# Patient Record
Sex: Male | Born: 1955 | Race: White | Hispanic: No | Marital: Married | Smoking: Current every day smoker
Health system: Southern US, Community
[De-identification: ages and names within clinical notes are randomized; demographics above are authoritative.]

## PROBLEM LIST (undated history)

## (undated) DIAGNOSIS — I4891 Unspecified atrial fibrillation: Secondary | ICD-10-CM

## (undated) DIAGNOSIS — E119 Type 2 diabetes mellitus without complications: Secondary | ICD-10-CM

## (undated) DIAGNOSIS — F32A Depression, unspecified: Secondary | ICD-10-CM

## (undated) DIAGNOSIS — I509 Heart failure, unspecified: Secondary | ICD-10-CM

## (undated) DIAGNOSIS — J45909 Unspecified asthma, uncomplicated: Secondary | ICD-10-CM

## (undated) DIAGNOSIS — Q231 Congenital insufficiency of aortic valve: Secondary | ICD-10-CM

## (undated) DIAGNOSIS — I10 Essential (primary) hypertension: Secondary | ICD-10-CM

## (undated) DIAGNOSIS — J449 Chronic obstructive pulmonary disease, unspecified: Secondary | ICD-10-CM

## (undated) DIAGNOSIS — Q2381 Bicuspid aortic valve: Secondary | ICD-10-CM

## (undated) HISTORY — DX: Essential (primary) hypertension: I10

## (undated) HISTORY — DX: Chronic obstructive pulmonary disease, unspecified: J44.9

## (undated) HISTORY — DX: Heart failure, unspecified: I50.9

## (undated) HISTORY — DX: Unspecified asthma, uncomplicated: J45.909

## (undated) HISTORY — PX: OTHER SURGICAL HISTORY: SHX169

## (undated) HISTORY — DX: Depression, unspecified: F32.A

---

## 2004-08-18 ENCOUNTER — Emergency Department: Payer: Self-pay | Admitting: Emergency Medicine

## 2004-08-18 ENCOUNTER — Other Ambulatory Visit: Payer: Self-pay

## 2004-09-03 ENCOUNTER — Other Ambulatory Visit: Payer: Self-pay

## 2004-09-04 ENCOUNTER — Other Ambulatory Visit: Payer: Self-pay

## 2004-09-04 ENCOUNTER — Inpatient Hospital Stay: Payer: Self-pay | Admitting: Anesthesiology

## 2006-07-28 ENCOUNTER — Ambulatory Visit: Payer: Self-pay | Admitting: Unknown Physician Specialty

## 2007-05-31 ENCOUNTER — Emergency Department: Payer: Self-pay | Admitting: Emergency Medicine

## 2007-05-31 ENCOUNTER — Other Ambulatory Visit: Payer: Self-pay

## 2008-11-16 ENCOUNTER — Emergency Department: Payer: Self-pay | Admitting: Emergency Medicine

## 2009-11-24 IMAGING — CR DG CHEST 2V
1 series · 2 of 2 positions shown · non-contrast
Comparison: none

REASON FOR EXAM: chest pain
COMMENTS:

PROCEDURE:     DXR - DXR CHEST PA (OR AP) AND LATERAL  - May 31, 2007  [DATE]
RESULT:     The lung fields are clear. The heart, mediastinal and osseous
structures show no significant abnormalities.

[Series 1: view not recorded · 0.17mm/px · 2 of 2 slices shown]
[im 1/2]
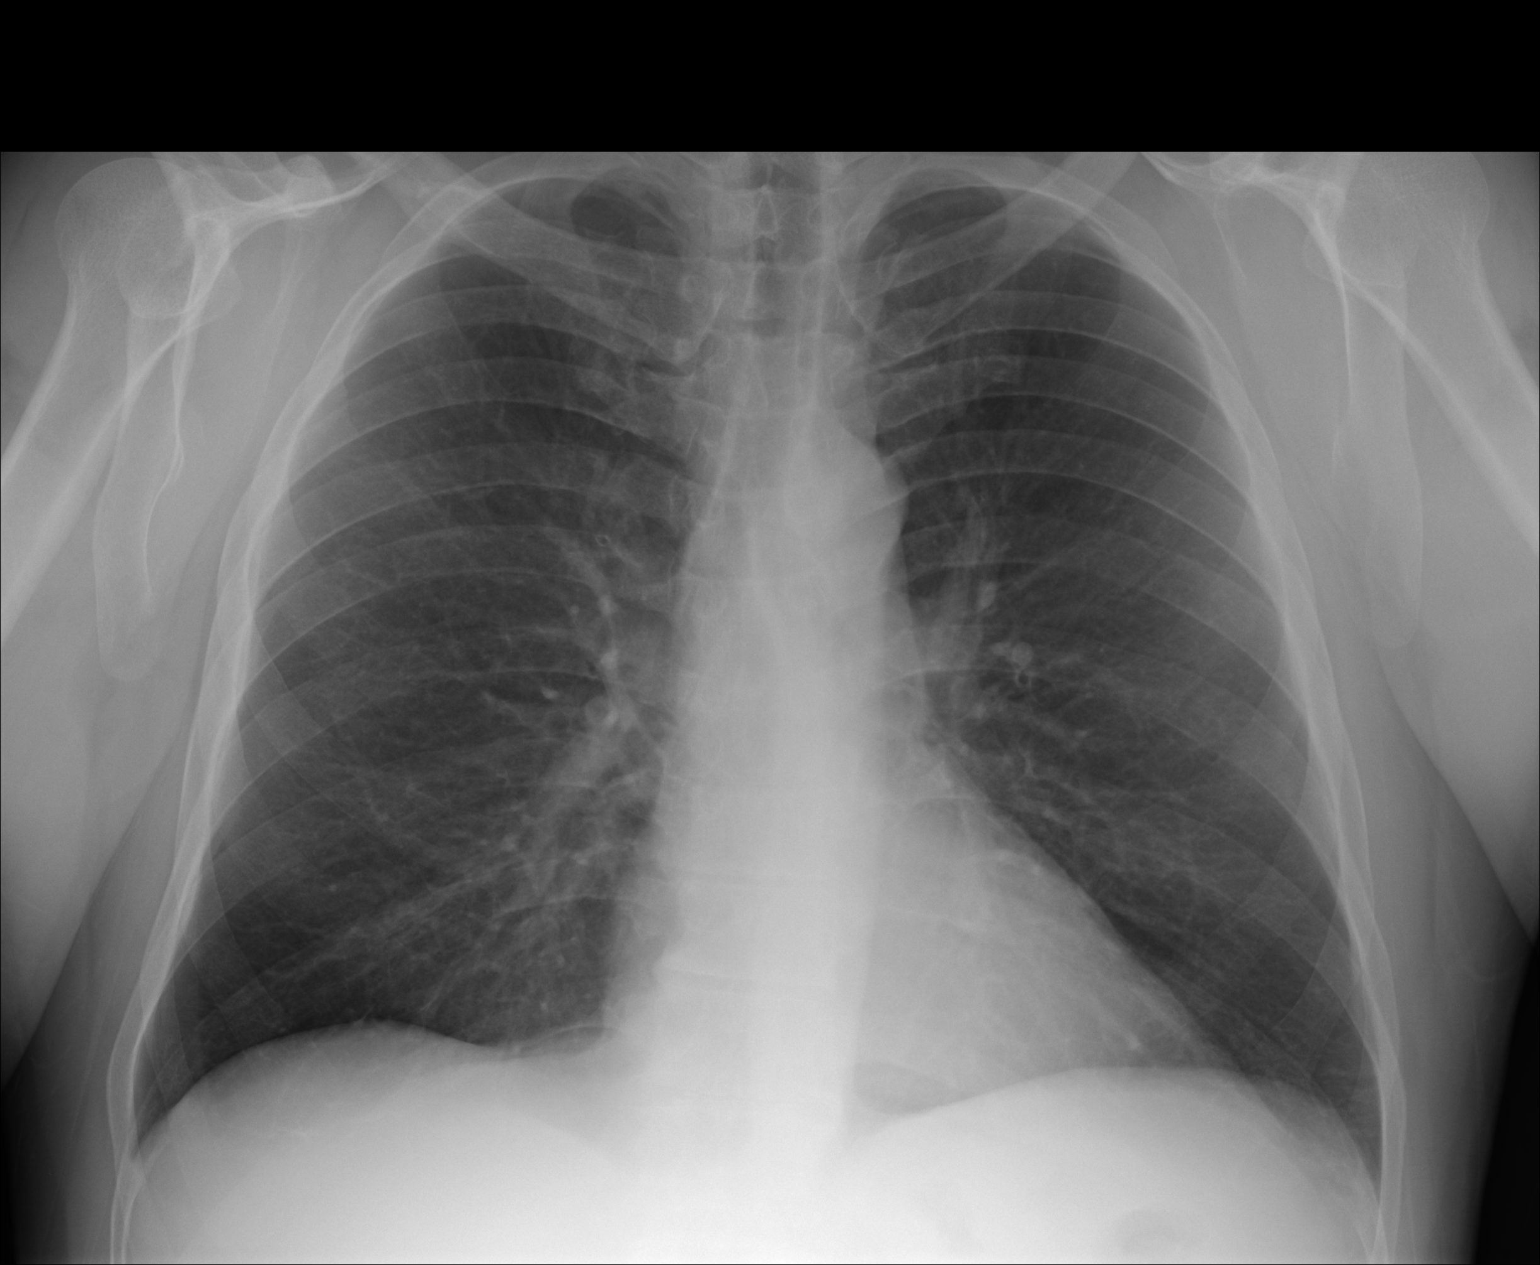
[im 2/2]
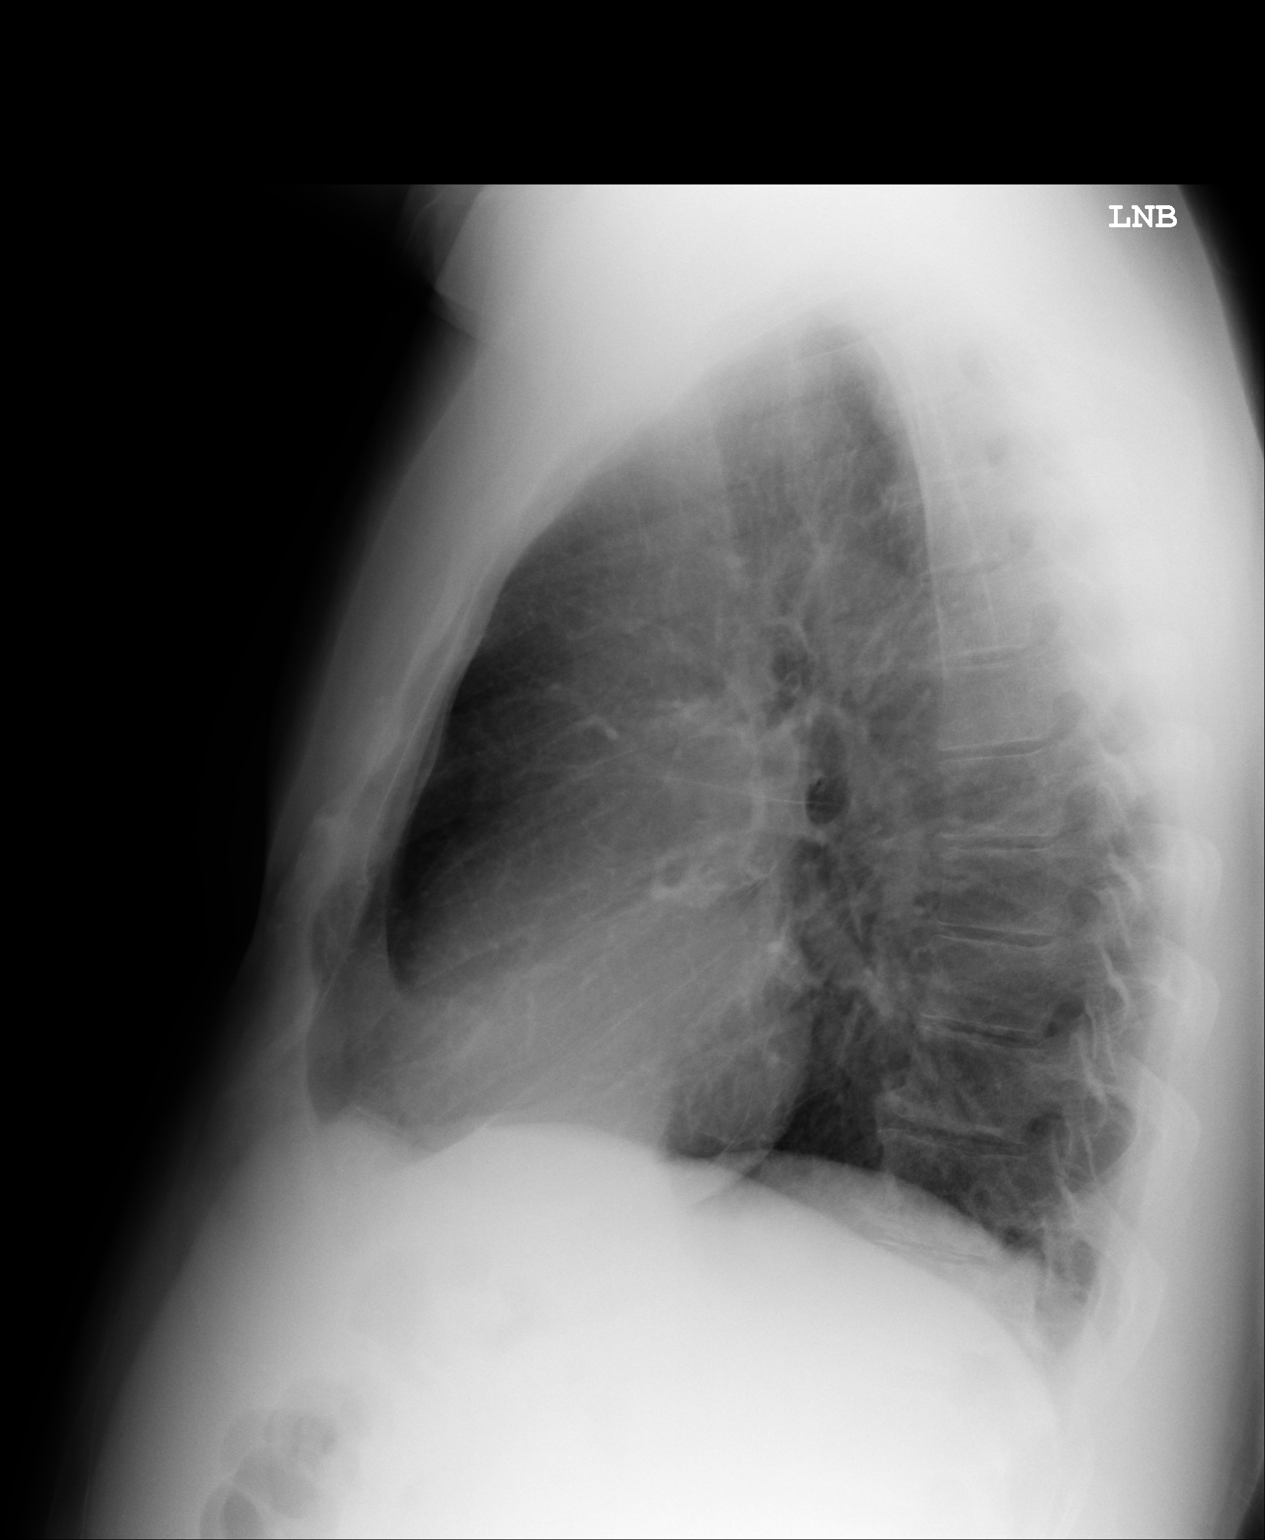

[2 of 2 positions shown; findings below may reference images not displayed]

IMPRESSION: No significant abnormalities are noted.

## 2010-10-03 ENCOUNTER — Emergency Department: Payer: Self-pay | Admitting: Internal Medicine

## 2011-05-13 IMAGING — CR DG CHEST 1V PORT
1 series · 1 of 1 positions shown · non-contrast
Comparison: none

REASON FOR EXAM: Chest pain
COMMENTS:

PROCEDURE:          DXR PORTABLE CHEST SINGLE VIEW 11/16/2008 [DATE]
RESULT:     Comparison is made to prior study dated 05-31-07. Note; this is a
re-dictation of a previously dictated report.
The lungs are clear. The cardiac silhouette and visualized bony skeleton are
unremarkable.

[view not recorded]
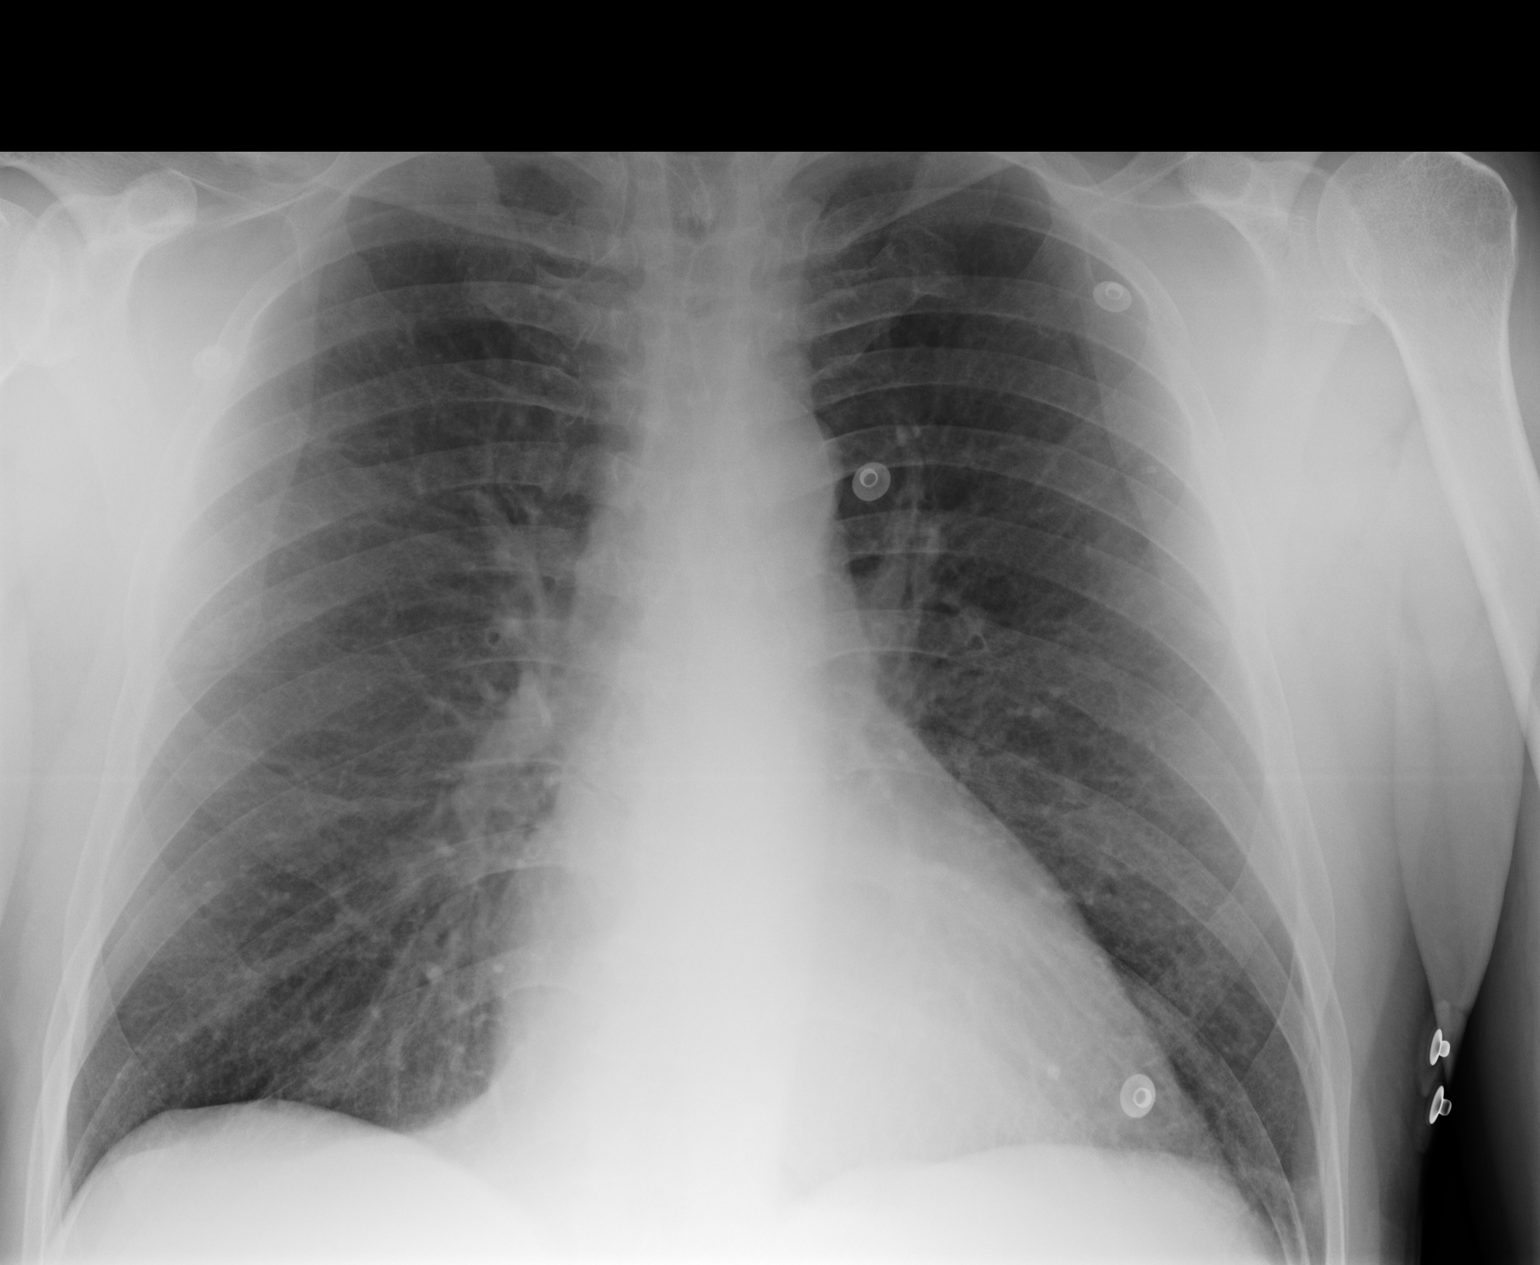

[1 of 1 positions shown; findings below may reference images not displayed]

IMPRESSION: 1. Chest radiograph without evidence of acute cardiopulmonary disease.

## 2017-08-08 DIAGNOSIS — N189 Chronic kidney disease, unspecified: Secondary | ICD-10-CM | POA: Diagnosis not present

## 2017-08-08 DIAGNOSIS — I70269 Atherosclerosis of native arteries of extremities with gangrene, unspecified extremity: Secondary | ICD-10-CM | POA: Insufficient documentation

## 2017-08-08 DIAGNOSIS — M79671 Pain in right foot: Secondary | ICD-10-CM | POA: Diagnosis present

## 2017-08-08 LAB — CBC WITH DIFFERENTIAL/PLATELET
BASOS ABS: 0.1 10*3/uL (ref 0–0.1)
Basophils Relative: 1 %
Eosinophils Absolute: 0.2 10*3/uL (ref 0–0.7)
Eosinophils Relative: 2 %
HEMATOCRIT: 41.1 % (ref 40.0–52.0)
Hemoglobin: 14.7 g/dL (ref 13.0–18.0)
LYMPHS PCT: 17 %
Lymphs Abs: 1.6 10*3/uL (ref 1.0–3.6)
MCH: 32.9 pg (ref 26.0–34.0)
MCHC: 35.7 g/dL (ref 32.0–36.0)
MCV: 92.2 fL (ref 80.0–100.0)
Monocytes Absolute: 0.7 10*3/uL (ref 0.2–1.0)
Monocytes Relative: 7 %
NEUTROS ABS: 7.2 10*3/uL — AB (ref 1.4–6.5)
NEUTROS PCT: 73 %
Platelets: 428 10*3/uL (ref 150–440)
RBC: 4.45 MIL/uL (ref 4.40–5.90)
RDW: 14.3 % (ref 11.5–14.5)
WBC: 9.8 10*3/uL (ref 3.8–10.6)

## 2017-08-08 LAB — LACTIC ACID, PLASMA: Lactic Acid, Venous: 0.9 mmol/L (ref 0.5–1.9)

## 2017-08-08 LAB — BASIC METABOLIC PANEL
ANION GAP: 11 (ref 5–15)
BUN: 21 mg/dL — ABNORMAL HIGH (ref 6–20)
CO2: 27 mmol/L (ref 22–32)
Calcium: 9.8 mg/dL (ref 8.9–10.3)
Chloride: 90 mmol/L — ABNORMAL LOW (ref 101–111)
Creatinine, Ser: 1.72 mg/dL — ABNORMAL HIGH (ref 0.61–1.24)
GFR calc Af Amer: 47 mL/min — ABNORMAL LOW (ref >60)
GFR, EST NON AFRICAN AMERICAN: 41 mL/min — AB (ref >60)
GLUCOSE: 140 mg/dL — AB (ref 65–99)
POTASSIUM: 4.5 mmol/L (ref 3.5–5.1)
Sodium: 128 mmol/L — ABNORMAL LOW (ref 135–145)

## 2017-08-08 NOTE — ED Triage Notes (Signed)
Patient reports has poor blood flow to right foot.  Family reports that he just got [facility] right [facility].  Reports increased pain with and redness.

## 2017-08-09 ENCOUNTER — Emergency Department
Admission: EM | Admit: 2017-08-09 | Discharge: 2017-08-09 | Disposition: A | Discharge status: Home / Self Care | Payer: Non-veteran care | Attending: Emergency Medicine | Admitting: Emergency Medicine

## 2017-08-09 DIAGNOSIS — I739 Peripheral vascular disease, unspecified: Secondary | ICD-10-CM

## 2017-08-09 DIAGNOSIS — I96 Gangrene, not elsewhere classified: Secondary | ICD-10-CM

## 2017-08-09 DIAGNOSIS — N189 Chronic kidney disease, unspecified: Secondary | ICD-10-CM

## 2017-08-09 MED ORDER — CLINDAMYCIN HCL 150 MG PO CAPS: 450.0000 mg | ORAL_CAPSULE | Freq: Three times a day (TID) | ORAL | 0 refills | Status: AC

## 2017-08-09 MED ORDER — OXYCODONE HCL 5 MG PO TABS: 5.0000 mg | ORAL_TABLET | Freq: Three times a day (TID) | ORAL | 0 refills | Status: AC | PRN

## 2017-08-09 MED ORDER — OXYCODONE HCL 5 MG PO TABS: 5.0000 mg | ORAL_TABLET | Freq: Three times a day (TID) | ORAL | 0 refills | Status: DC | PRN

## 2017-08-09 MED ORDER — OXYCODONE-ACETAMINOPHEN 5-325 MG PO TABS
1.0000 | ORAL_TABLET | Freq: Once | ORAL | Status: AC
  Administered 2017-08-09: 1 via ORAL

## 2017-08-09 MED ORDER — CLINDAMYCIN HCL 150 MG PO CAPS: 150.0000 mg | ORAL_CAPSULE | Freq: Three times a day (TID) | ORAL | 0 refills | Status: DC

## 2017-08-09 MED ORDER — OXYCODONE-ACETAMINOPHEN 5-325 MG PO TABS
ORAL_TABLET | ORAL | Status: AC
  Administered 2017-08-09: 1 via ORAL
  Filled 2017-08-09: qty 1

## 2017-08-09 NOTE — ED Notes (Signed)

## 2017-08-09 NOTE — ED Provider Notes (Signed)
[facility] Emergency Department Provider Note  ____________________________________________   First MD Initiated Contact with Patient 08/09/17 0113     (approximate)  I have reviewed the triage vital signs and the nursing notes.   HISTORY  Chief Complaint Foot Pain   HPI Jason Beck is a 62 y.o. male comes to the emergency department with right foot pain.  He has a history of chronic right foot pain secondary to poor circulation.  He is followed at the [facility] and is scheduled to have his right great toe and right fourth toe amputated next week.  He has been intermittently taking clindamycin for the past month or so although has recently run out.  He denies fevers or chills.  Today his sister became concerned about his symptoms and wanted a second opinion so she called 911.  The patient has moderate severity throbbing aching discomfort in his right foot improved when putting it down worsened when elevating.  No past medical history on file.  There are no active problems to display for this patient.     Prior to Admission medications   Medication Sig Start Date End Date Taking? Authorizing Provider  clindamycin (CLEOCIN) 150 MG capsule Take 3 capsules (450 mg total) by mouth 3 (three) times daily for 14 days. 08/09/17 08/23/17  Merrily Brittleifenbark, Darvell Monteforte, MD  oxyCODONE (ROXICODONE) 5 MG immediate release tablet Take 1 tablet (5 mg total) by mouth every 8 (eight) hours as needed for up to 11 days. 08/09/17 08/20/17  Merrily Brittleifenbark, Kelcey Korus, MD    Allergies Patient has no known allergies.  No family history on file.  Social History Social History   Tobacco Use  . Smoking status: Not on file  Substance Use Topics  . Alcohol use: Not on file  . Drug use: Not on file    Review of Systems Constitutional: No fever/chills Eyes: No visual changes. ENT: No sore throat. Cardiovascular: Denies chest pain. Respiratory: Denies shortness of  breath. Gastrointestinal: No abdominal pain.  No nausea, no vomiting.  No diarrhea.  No constipation. Genitourinary: Negative for dysuria. Musculoskeletal: Negative for back pain. Skin: Positive for wound Neurological: Negative for headaches, focal weakness or numbness.   ____________________________________________   PHYSICAL EXAM:  VITAL SIGNS: ED Triage Vitals  Enc Vitals Group     BP 08/08/17 2232 (!) 102/56     Pulse Rate 08/08/17 2232 87     Resp 08/08/17 2232 20     Temp 08/08/17 2232 98.6 F (37 C)     Temp Source 08/08/17 2232 Oral     SpO2 08/08/17 2232 100 %     Weight 08/08/17 2231 150 lb (68 kg)     Height 08/08/17 2231 5\' 10"  (1.778 m)     Head Circumference --      Peak Flow --      Pain Score 08/08/17 2231 10     Pain Loc --      Pain Edu? --      Excl. in GC? --     Constitutional: Alert and oriented x4 pleasant cooperative speaks full clear sentences no diaphoresis Eyes: PERRL EOMI. Head: Atraumatic. Nose: No congestion/rhinnorhea. Mouth/Throat: No trismus Neck: No stridor.   Cardiovascular: Normal rate, regular rhythm. Grossly normal heart sounds.  Good peripheral circulation. Respiratory: Normal respiratory effort.  No retractions. Lungs CTAB and moving good air Gastrointestinal: Soft nontender Musculoskeletal: Right foot with dragging green of the great toe and toe.  Biphasic PT and DP pulses Neurologic:  Normal speech and language. No gross focal neurologic deficits are appreciated. Skin:  Skin is warm, dry and intact. No rash noted. Psychiatric: Mood and affect are normal. Speech and behavior are normal.    ____________________________________________   DIFFERENTIAL includes but not limited to  Wet gangrene, dry gangrene, osteomyelitis, sepsis ____________________________________________   LABS (all labs ordered are listed, but only abnormal results are displayed)  Labs Reviewed  CBC WITH DIFFERENTIAL/PLATELET - Abnormal; Notable for  the following components:      Result Value   Neutro Abs 7.2 (*)    All other components within normal limits  BASIC METABOLIC PANEL - Abnormal; Notable for the following components:   Sodium 128 (*)    Chloride 90 (*)    Glucose, Bld 140 (*)    BUN 21 (*)    Creatinine, Ser 1.72 (*)    GFR calc non Af Amer 41 (*)    GFR calc Af Amer 47 (*)    All other components within normal limits  CULTURE, BLOOD (ROUTINE X 2)  LACTIC ACID, PLASMA    Lab work reviewed by me with hypochloremic hyponatremia consistent with dehydration __________________________________________  EKG   ____________________________________________  RADIOLOGY   ____________________________________________   PROCEDURES  Procedure(s) performed: no  Procedures  Critical Care performed: no  Observation: no ____________________________________________   INITIAL IMPRESSION / ASSESSMENT AND PLAN / ED COURSE  Pertinent labs & imaging results that were available during my care of the patient were reviewed by me and considered in my medical decision making (see chart for details).  The patient is hemodynamically stable and clearly not septic.  He has biphasic PT and DP pulses.  His gangrene has been gradual onset over the past month and he has no acute surgical interventions warranted.  He has follow-up at the [place] this week.  I will refill his clindamycin as well as his pain medication.  He is discharged home in improved condition with strict return precautions.  He and his sister verbalized understanding and agreement the plan.     -----------------------------------------  FINAL CLINICAL IMPRESSION(S) / ED DIAGNOSES  Final diagnoses:  Dry gangrene (HCC)  Peripheral artery disease (HCC)  Chronic kidney disease, unspecified CKD stage      NEW MEDICATIONS STARTED DURING THIS VISIT:  Discharge Medication List as of 08/09/2017  1:24 AM    START taking these medications   Details  clindamycin  (CLEOCIN) 150 MG capsule Take 1 capsule (150 mg total) by mouth 3 (three) times daily for 14 days., Starting Mon 08/09/2017, Until Mon 08/23/2017, Print    oxyCODONE (ROXICODONE) 5 MG immediate release tablet Take 1 tablet (5 mg total) by mouth every 8 (eight) hours as needed for up to 11 days., Starting Mon 08/09/2017, Until Fri 08/20/2017, Print         Note:  This document was prepared using Dragon voice recognition software and may include unintentional dictation errors.     Merrily Brittle, MD 08/12/17 [phone]

## 2017-08-09 NOTE — Discharge Instructions (Signed)
Please resume taking antibiotics as prescribed and [facility].  Keep your appointment for surgery as scheduled.  Return to the emergency department sooner for any concerns.  It was a pleasure to take care of you today, and thank you for coming to our emergency department.  If you have any questions or concerns before leaving please ask the nurse to grab me and I'm more than happy to go through your aftercare instructions again.  If you were prescribed any opioid pain medication today such as Norco, Vicodin, Percocet, morphine, hydrocodone, or oxycodone please make sure you do not drive when you are taking this medication as it can alter your ability to drive safely.  If you have any concerns once you are home that you are not improving or are in fact getting worse before you can make it to your follow-up appointment, please do not hesitate to call 911 and come back for further evaluation.  Merrily BrittleNeil Radonna Bracher, MD  Results for orders placed or performed during the hospital encounter of 08/09/17  CBC with Differential  Result Value Ref Range   WBC 9.8 3.8 - 10.6 K/uL   RBC 4.45 4.40 - 5.90 MIL/uL   Hemoglobin 14.7 13.0 - 18.0 g/dL   HCT 16.141.1 09.640.0 - 04.552.0 %   MCV 92.2 80.0 - 100.0 fL   MCH 32.9 26.0 - 34.0 pg   MCHC 35.7 32.0 - 36.0 g/dL   RDW 40.914.3 81.111.5 - 91.414.5 %   Platelets 428 150 - 440 K/uL   Neutrophils Relative % 73 %   Neutro Abs 7.2 (H) 1.4 - 6.5 K/uL   Lymphocytes Relative 17 %   Lymphs Abs 1.6 1.0 - 3.6 K/uL   Monocytes Relative 7 %   Monocytes Absolute 0.7 0.2 - 1.0 K/uL   Eosinophils Relative 2 %   Eosinophils Absolute 0.2 0 - 0.7 K/uL   Basophils Relative 1 %   Basophils Absolute 0.1 0 - 0.1 K/uL  Basic metabolic panel  Result Value Ref Range   Sodium 128 (L) 135 - 145 mmol/L   Potassium 4.5 3.5 - 5.1 mmol/L   Chloride 90 (L) 101 - 111 mmol/L   CO2 27 22 - 32 mmol/L   Glucose, Bld 140 (H) 65 - 99 mg/dL   BUN 21 (H) 6 - 20 mg/dL   Creatinine, Ser 7.821.72 (H) 0.61 - 1.24 mg/dL   Calcium 9.8 8.9 - 95.610.3 mg/dL   GFR calc non Af Amer 41 (L) >60 mL/min   GFR calc Af Amer 47 (L) >60 mL/min   Anion gap 11 5 - 15  Lactic acid, plasma  Result Value Ref Range   Lactic Acid, Venous 0.9 0.5 - 1.9 mmol/L

## 2017-08-13 LAB — CULTURE, BLOOD (ROUTINE X 2)
CULTURE: NO GROWTH
SPECIAL REQUESTS: ADEQUATE

## 2017-12-10 ENCOUNTER — Encounter: Payer: Self-pay | Admitting: Podiatry

## 2017-12-10 ENCOUNTER — Other Ambulatory Visit: Payer: Self-pay | Admitting: Podiatry

## 2017-12-10 ENCOUNTER — Ambulatory Visit (INDEPENDENT_AMBULATORY_CARE_PROVIDER_SITE_OTHER): Payer: No Typology Code available for payment source

## 2017-12-10 ENCOUNTER — Ambulatory Visit (INDEPENDENT_AMBULATORY_CARE_PROVIDER_SITE_OTHER): Payer: No Typology Code available for payment source | Admitting: Podiatry

## 2017-12-10 DIAGNOSIS — I739 Peripheral vascular disease, unspecified: Secondary | ICD-10-CM

## 2017-12-10 DIAGNOSIS — M79671 Pain in right foot: Secondary | ICD-10-CM

## 2017-12-10 DIAGNOSIS — S98131A Complete traumatic amputation of one right lesser toe, initial encounter: Secondary | ICD-10-CM

## 2017-12-12 NOTE — Progress Notes (Signed)
   Subjective:  Patient presents today status post 2nd and 5th toe amputation of the right foot done at the [facility] three months ago. He states he had a wound vac in place but is no longer using it. He reports the wounds are now healing. He also reports elongated, thickened toenails of the 1st, 3rd and 4th digits of the right foot that cause pain while ambulating. He is unable to trim his own nails. Patient is here for further evaluation and treatment.   No past medical history on file.    Objective/Physical Exam Neurovascular status intact.  Skin incisions appear to be healed. No sign of infectious process noted. No dehiscence. No active bleeding noted. No edema noted to the surgical extremity. Nails are tender, long, thickened and dystrophic with subungual debris, consistent with onychomycosis of the 1st, 3rd and 4th toes of the right foot.   Radiographic Exam:  Osteotomies sites appear to be stable with routine healing.  Assessment: 1. 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 Diabetes Mellitus w/ peripheral neuropathy 3. Onychomycosis of nail due to dermatophyte 1, 3 and 4 digits right     Plan of Care:  1. Patient was evaluated. X-rays reviewed 2. Mechanical debridement of nails 1, 3 and 4 of the right foot performed using a nail nipper. Filed with dremel without incident.  3. Discontinue using post op shoe. Resume wearing good shoe gear.  4. Return to clinic as needed.    Felecia ShellingBrent M. Macklin Jacquin, DPM [facility]  Dr. Felecia ShellingBrent M. Madilyne Tadlock, DPM    [address]                                        [place], [place] [address]                Office [phone]  Fax [phone]

## 2018-01-07 ENCOUNTER — Ambulatory Visit (INDEPENDENT_AMBULATORY_CARE_PROVIDER_SITE_OTHER): Payer: No Typology Code available for payment source | Admitting: Podiatry

## 2018-01-07 ENCOUNTER — Ambulatory Visit (INDEPENDENT_AMBULATORY_CARE_PROVIDER_SITE_OTHER): Payer: Non-veteran care

## 2018-01-07 ENCOUNTER — Encounter: Payer: Self-pay | Admitting: Podiatry

## 2018-01-07 DIAGNOSIS — S90851A Superficial foreign body, right foot, initial encounter: Secondary | ICD-10-CM

## 2018-01-12 NOTE — Progress Notes (Signed)
   HPI: 62 year old male presents the office today for evaluation of a possible foreign body to the plantar aspect of the right foot.  Patient states that approximately 2 weeks ago he thinks he stepped on a piece of glass.  Walking aggravates his symptoms.  Is very tender to palpation.  He continues to have chronic soreness to the area.  He was barefoot at the time of injury.  He presents for further treatment evaluation  No past medical history on file.   Physical Exam: General: The patient is alert and oriented x3 in no acute distress.  Dermatology: Skin is warm, dry and supple bilateral lower extremities.  There is a focal area of callus tissue with a small draining sinus noted to the plantar aspect of the right foot.  Vascular: Palpable pedal pulses bilaterally. No edema or erythema noted. Capillary refill within normal limits.  Neurological: Epicritic and protective threshold diminished bilaterally.   Musculoskeletal Exam: Range of motion within normal limits to all pedal and ankle joints bilateral. Muscle strength 5/5 in all groups bilateral.   Radiographic Exam:  Normal osseous mineralization. Joint spaces preserved. No fracture/dislocation/boney destruction.  No visual evidence of foreign body  Assessment: 1.  Foreign body right plantar foot - glass shard approximately 5 mm   Plan of Care:  1. Patient evaluated. X-Rays reviewed.  2.  Light debridement of the area was performed using a tissue nipper and a glass shard was removed from the plantar aspect of the foot approximately 5 mm in length.  Wound was evaluated to ensure that all foreign bodies were removed.  Light debridement performed using a tissue nipper.  Incision and drainage of the focal abscess was performed.  Antibiotic ointment a Band-Aid was applied. 3.  Anesthesia not utilized due to the patient's known peripheral neuropathy 4.  Recommend antibiotic ointment and Band-Aid daily x5 days. 5.  Return to clinic as  needed      Felecia Shelling, DPM [facility]  Dr. Felecia Shelling, DPM    [address] [place], [place] [address]                Office [phone]  Fax [phone]

## 2018-03-24 ENCOUNTER — Telehealth: Payer: Self-pay | Admitting: Podiatry

## 2018-03-24 NOTE — Telephone Encounter (Signed)
Please send notes for 12/10/17 visti,  to [facility] at [phone]

## 2018-06-01 ENCOUNTER — Encounter: Payer: Self-pay | Admitting: Emergency Medicine

## 2018-06-01 ENCOUNTER — Emergency Department
Admission: EM | Admit: 2018-06-01 | Discharge: 2018-06-01 | Disposition: A | Discharge status: Home / Self Care | Payer: Non-veteran care | Attending: Emergency Medicine | Admitting: Emergency Medicine

## 2018-06-01 ENCOUNTER — Emergency Department: Payer: Non-veteran care

## 2018-06-01 ENCOUNTER — Other Ambulatory Visit: Payer: Self-pay

## 2018-06-01 DIAGNOSIS — R0602 Shortness of breath: Secondary | ICD-10-CM | POA: Diagnosis present

## 2018-06-01 DIAGNOSIS — F17210 Nicotine dependence, cigarettes, uncomplicated: Secondary | ICD-10-CM | POA: Insufficient documentation

## 2018-06-01 DIAGNOSIS — J441 Chronic obstructive pulmonary disease with (acute) exacerbation: Secondary | ICD-10-CM | POA: Insufficient documentation

## 2018-06-01 DIAGNOSIS — R0600 Dyspnea, unspecified: Secondary | ICD-10-CM

## 2018-06-01 DIAGNOSIS — Z79899 Other long term (current) drug therapy: Secondary | ICD-10-CM | POA: Insufficient documentation

## 2018-06-01 LAB — BASIC METABOLIC PANEL
ANION GAP: 9 (ref 5–15)
BUN: 12 mg/dL (ref 8–23)
CO2: 25 mmol/L (ref 22–32)
Calcium: 9.1 mg/dL (ref 8.9–10.3)
Chloride: 102 mmol/L (ref 98–111)
Creatinine, Ser: 0.94 mg/dL (ref 0.61–1.24)
Glucose, Bld: 163 mg/dL — ABNORMAL HIGH (ref 70–99)
POTASSIUM: 3.9 mmol/L (ref 3.5–5.1)
SODIUM: 136 mmol/L (ref 135–145)

## 2018-06-01 LAB — CBC WITH DIFFERENTIAL/PLATELET
ABS IMMATURE GRANULOCYTES: 0.04 10*3/uL (ref 0.00–0.07)
BASOS PCT: 1 %
Basophils Absolute: 0.1 10*3/uL (ref 0.0–0.1)
Eosinophils Absolute: 0.7 10*3/uL — ABNORMAL HIGH (ref 0.0–0.5)
Eosinophils Relative: 7 %
HCT: 48.4 % (ref 39.0–52.0)
Hemoglobin: 16.1 g/dL (ref 13.0–17.0)
IMMATURE GRANULOCYTES: 0 %
LYMPHS PCT: 43 %
Lymphs Abs: 4 10*3/uL (ref 0.7–4.0)
MCH: 31.1 pg (ref 26.0–34.0)
MCHC: 33.3 g/dL (ref 30.0–36.0)
MCV: 93.6 fL (ref 80.0–100.0)
Monocytes Absolute: 0.7 10*3/uL (ref 0.1–1.0)
Monocytes Relative: 7 %
NEUTROS ABS: 3.9 10*3/uL (ref 1.7–7.7)
NEUTROS PCT: 42 %
PLATELETS: 270 10*3/uL (ref 150–400)
RBC: 5.17 MIL/uL (ref 4.22–5.81)
RDW: 13.2 % (ref 11.5–15.5)
WBC: 9.3 10*3/uL (ref 4.0–10.5)
nRBC: 0 % (ref 0.0–0.2)

## 2018-06-01 LAB — BRAIN NATRIURETIC PEPTIDE: B Natriuretic Peptide: 152 pg/mL — ABNORMAL HIGH (ref 0.0–100.0)

## 2018-06-01 LAB — TROPONIN I: Troponin I: <0.03 ng/mL (ref <0.03)

## 2018-06-01 MED ORDER — IPRATROPIUM-ALBUTEROL 0.5-2.5 (3) MG/3ML IN SOLN
3.0000 mL | Freq: Once | RESPIRATORY_TRACT | Status: AC
  Administered 2018-06-01: 3 mL via RESPIRATORY_TRACT
  Filled 2018-06-01: qty 3

## 2018-06-01 MED ORDER — PREDNISONE 10 MG PO TABS: 10.0000 mg | ORAL_TABLET | Freq: Every day | ORAL | 0 refills | Status: DC

## 2018-06-01 NOTE — ED Triage Notes (Signed)
Pt arrived to the ED via EMS from home for complaints of shortness of breath for the past 2 days. Pt reports that he has a history of COPD and he is experiencing an exacerbation that was unrelieved by home breathing treatments and taking using an Epipen. Pt is AOx4 in no apparent distress upon arrival to the hospital. EMS gave 125mg  of Solumedrol and 1 duo neb in rout.

## 2018-06-01 NOTE — ED Provider Notes (Signed)
[facility] Emergency Department Provider Note  Time seen: 7:13 AM  I have reviewed the triage vital signs and the nursing notes.   HISTORY  Chief Complaint Shortness of Breath    HPI Jason Beck is a 63 y.o. male with a past medical history of COPD presents to the emergency department for shortness of breath.  According to the patient he woke up starting around 3:00 overnight feeling short of breath took his albuterol inhaler with some relief, then again awoke around 5 AM with shortness of breath uses albuterol inhaler once again, with minimal relief this time so he came into the emergency department for evaluation.  Patient received 125 mg of Solu-Medrol 1 DuoNeb by EMS prior to arrival.  Upon arrival patient states he feels much better.  Denies any significant trouble breathing at this time.  Patient states earlier this morning he was feeling mild chest tightness although denies any currently.  Denies any leg pain or swelling.  Has a history of COPD but no baseline O2 requirement.   History reviewed. No pertinent past medical history.  There are no active problems to display for this patient.   History reviewed. No pertinent surgical history.  Prior to Admission medications   Medication Sig Start Date End Date Taking? Authorizing Provider  clopidogrel (PLAVIX) 75 MG tablet Take 75 mg by mouth daily.    [provider]  fenofibrate (TRICOR) 145 MG tablet Take 145 mg by mouth daily.    [provider]  gabapentin (NEURONTIN) 300 MG capsule Take 300 mg by mouth 3 (three) times daily.    [provider]    No Known Allergies  History reviewed. No pertinent family history.  Social History Social History   Tobacco Use  . Smoking status: Current Every Day Smoker    Packs/day: 0.50    Types: Cigarettes  . Smokeless tobacco: Never Used  Substance Use Topics  . Alcohol use: Yes    Alcohol/week: 2.0 - 3.0 standard drinks   Types: 2 - 3 Cans of beer per week    Comment: daily  . Drug use: Never    Review of Systems Constitutional: Negative for fever. ENT: Negative for recent illness/congestion Cardiovascular: Mild chest tightness earlier, now resolved. Respiratory: Positive for shortness of breath since early this morning, much improved at this time. Gastrointestinal: Negative for abdominal pain, vomiting Musculoskeletal: Negative for musculoskeletal complaints Skin: Negative for skin complaints  Neurological: Negative for headache All other ROS negative  ____________________________________________   PHYSICAL EXAM:  VITAL SIGNS: ED Triage Vitals  Enc Vitals Group     BP 06/01/18 0635 (!) 159/82     Pulse Rate 06/01/18 0635 86     Resp 06/01/18 0635 18     Temp 06/01/18 0635 98 F (36.7 C)     Temp Source 06/01/18 0635 Oral     SpO2 06/01/18 0634 91 %     Weight 06/01/18 0635 186 lb (84.4 kg)     Height 06/01/18 0635 5\' 10"  (1.778 m)     Head Circumference --      Peak Flow --      Pain Score 06/01/18 0635 0     Pain Loc --      Pain Edu? --      Excl. in GC? --     Constitutional: Alert and oriented. Well appearing and in no distress. Eyes: Normal exam ENT   Head: Normocephalic and atraumatic.   Mouth/Throat: Mucous membranes are  moist. Cardiovascular: Normal rate, regular rhythm.  Respiratory: Normal respiratory effort without tachypnea nor retractions.  Mild expiratory wheezes bilaterally.  No obvious rales or rhonchi. Gastrointestinal: Soft and nontender. No distention.   Musculoskeletal: Nontender with normal range of motion in all extremities. No lower extremity tenderness or edema. Neurologic:  Normal speech and language. No gross focal neurologic deficits Skin:  Skin is warm, dry and intact.  Psychiatric: Mood and affect are normal.   ____________________________________________    EKG  EKG viewed and interpreted by myself shows a normal sinus rhythm 88 bpm with a  narrow QRS, normal axis, normal intervals, nonspecific ST changes occasional PVC.  No ST elevation.  ____________________________________________    RADIOLOGY  Chest x-ray is clear  ____________________________________________   INITIAL IMPRESSION / ASSESSMENT AND PLAN / ED COURSE  Pertinent labs & imaging results that were available during my care of the patient were reviewed by me and considered in my medical decision making (see chart for details).  Patient presents to the emergency department for difficulty breathing starting earlier this morning.  Differential at this time would include COPD exacerbation, pneumonia, pneumothorax, URI, ACS.  Patient's lab work is reassuring including a negative troponin.  Chest x-ray is clear.  Patient has mild expiratory wheeze on exam.  Patient states he feels much better after a DuoNeb this morning and Solu-Medrol by EMS.  Patient currently satting between 91 and 93% during my evaluation.  We will dose 2 more duo nebs.  Patient states he would prefer to go home.  As long as the patient is feeling better we will discharge on prednisone, patient will continue to use albuterol at home.  I discussed with the patient very strict return precautions if he is to worsen he will return to the emergency department for reevaluation and possible admission.  ____________________________________________   FINAL CLINICAL IMPRESSION(S) / ED DIAGNOSES  COPD exacerbation Dyspnea   Minna AntisPaduchowski, Jacqulene Huntley, MD 06/01/18 [phone]

## 2018-06-01 NOTE — ED Notes (Signed)
Pt states he is feeling better and is ready to go

## 2018-06-01 NOTE — ED Notes (Addendum)
MD made aware of pt's desire to leave/be discharged

## 2019-09-15 ENCOUNTER — Ambulatory Visit: Payer: Non-veteran care | Admitting: Podiatry

## 2019-09-15 ENCOUNTER — Other Ambulatory Visit: Payer: Self-pay

## 2019-10-13 ENCOUNTER — Ambulatory Visit (INDEPENDENT_AMBULATORY_CARE_PROVIDER_SITE_OTHER): Payer: Non-veteran care

## 2019-10-13 ENCOUNTER — Other Ambulatory Visit: Payer: Self-pay

## 2019-10-13 ENCOUNTER — Other Ambulatory Visit: Payer: Self-pay | Admitting: Podiatry

## 2019-10-13 ENCOUNTER — Encounter: Payer: Self-pay | Admitting: Podiatry

## 2019-10-13 ENCOUNTER — Ambulatory Visit (INDEPENDENT_AMBULATORY_CARE_PROVIDER_SITE_OTHER): Payer: No Typology Code available for payment source | Admitting: Podiatry

## 2019-10-13 DIAGNOSIS — B351 Tinea unguium: Secondary | ICD-10-CM | POA: Diagnosis not present

## 2019-10-13 DIAGNOSIS — I739 Peripheral vascular disease, unspecified: Secondary | ICD-10-CM

## 2019-10-13 DIAGNOSIS — L989 Disorder of the skin and subcutaneous tissue, unspecified: Secondary | ICD-10-CM

## 2019-10-13 DIAGNOSIS — M79674 Pain in right toe(s): Secondary | ICD-10-CM | POA: Diagnosis not present

## 2019-10-13 DIAGNOSIS — M79675 Pain in left toe(s): Secondary | ICD-10-CM | POA: Diagnosis not present

## 2019-10-13 DIAGNOSIS — M779 Enthesopathy, unspecified: Secondary | ICD-10-CM

## 2019-10-13 NOTE — Progress Notes (Signed)
   SUBJECTIVE Patient presents to office today complaining of elongated, thickened nails that cause pain while ambulating in shoes.  He is unable to trim his own nails.  Patient also complains of painful callus lesions to the right foot amputation stumps.  Patient has a history of second and fifth toe amputations.  The calluses have developed over the incision sites and they are very painful in shoe gear.  Patient is here for further evaluation and treatment.  No past medical history on file.  Patient has a history of peripheral vascular disease  OBJECTIVE General Patient is awake, alert, and oriented x 3 and in no acute distress. Derm Skin is dry and supple bilateral. Negative open lesions or macerations.  Hyperkeratotic callus lesions noted to the second and fifth toe amputation stumps right foot that are very tender to touch.. Nails are tender, long, thickened and dystrophic with subungual debris, consistent with onychomycosis, 1-5 bilateral. No signs of infection noted. Vasc  DP and PT pedal pulses palpable bilaterally. Temperature gradient within normal limits.  Neuro Epicritic and protective threshold sensation grossly intact bilaterally.  Musculoskeletal Exam history of second and fifth toe amputations right foot  ASSESSMENT 1. Onychodystrophic nails 1-5 bilateral with hyperkeratosis of nails.  2. Onychomycosis of nail due to dermatophyte bilateral 3.  Preulcerative callus lesions right foot x2  4.  Pain in foot bilateral  PLAN OF CARE 1. Patient evaluated today.  2. Instructed to maintain good pedal hygiene and foot care.  3. Mechanical debridement of nails 1-5 bilaterally performed using a nail nipper. Filed with dremel without incident.  4.  Excisional debridement of the hyperkeratotic callus lesions was performed using a tissue nipper without incident or bleeding  5.  Return to clinic in 3 mos for routine foot care.    Felecia Shelling, DPM [facility]  Dr. Felecia Shelling, DPM    [address]                                        [place], [place] [address]                Office [phone]  Fax [phone]

## 2020-04-15 ENCOUNTER — Other Ambulatory Visit: Payer: Self-pay

## 2020-04-15 ENCOUNTER — Encounter: Payer: Self-pay | Admitting: Podiatry

## 2020-04-15 ENCOUNTER — Ambulatory Visit (INDEPENDENT_AMBULATORY_CARE_PROVIDER_SITE_OTHER): Payer: Medicare Other | Admitting: Podiatry

## 2020-04-15 DIAGNOSIS — B351 Tinea unguium: Secondary | ICD-10-CM | POA: Diagnosis not present

## 2020-04-15 DIAGNOSIS — I739 Peripheral vascular disease, unspecified: Secondary | ICD-10-CM

## 2020-04-15 DIAGNOSIS — M79674 Pain in right toe(s): Secondary | ICD-10-CM | POA: Diagnosis not present

## 2020-04-15 DIAGNOSIS — M79675 Pain in left toe(s): Secondary | ICD-10-CM

## 2020-04-15 DIAGNOSIS — L989 Disorder of the skin and subcutaneous tissue, unspecified: Secondary | ICD-10-CM

## 2020-04-15 NOTE — Progress Notes (Signed)
This patient presents the office with chief complaint of a painful callus on the top of his right foot this callus has formed above the incision on the top of the right foot for which the fifth digit and ray was removed.  He also has thick painful nails noted on both feet.  His feet are painful walking and wearing his shoes.  He also has a history of second and fifth toe amputations.  The callus that has developed on the dorsum outside of his right foot is extremely painful.  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  He presents the office today for an evaluation and treatment.  This patient has not been seen in over 6 months.  General Appearance  Alert, conversant and in no acute stress.  Vascular  Dorsalis pedis is  palpable  Right foot.  Dorsalis pedis and posterior tibial pulses  B/L are absent.  Capillary return is within normal limits  Bilaterally.Cold feet noted  bilaterally.  Neurologic  Senn-Weinstein monofilament wire test within normal limits  bilaterally. Muscle power within normal limits bilaterally.  Nails Thick disfigured discolored nails with subungual debris  from hallux to fifth toes bilaterally. No evidence of bacterial infection or drainage bilaterally.  Amputation second and fifth toe right foot.  Orthopedic  No limitations of motion  feet .  No crepitus or effusions noted.  No bony pathology or digital deformities noted.  Skin  normotropic skin with no porokeratosis noted bilaterally.  No signs of infections or ulcers noted.  Callus on dorsum of incision fifth met dorsally right foot.  Callus noted dorsum right foot.  Onychomycosis  B/L  Debridement of thick disfigured discolored nails  B/L with nail nipper and dremel tool.  Debride callus with # 15 blade .  Patient could not tolerate dremel tool usage right foot.  RTC 10 weeks.   Gardiner Barefoot DPM

## 2020-06-24 ENCOUNTER — Encounter: Payer: Self-pay | Admitting: Podiatry

## 2020-06-24 ENCOUNTER — Other Ambulatory Visit: Payer: Self-pay

## 2020-06-24 ENCOUNTER — Ambulatory Visit (INDEPENDENT_AMBULATORY_CARE_PROVIDER_SITE_OTHER): Payer: No Typology Code available for payment source | Admitting: Podiatry

## 2020-06-24 DIAGNOSIS — I739 Peripheral vascular disease, unspecified: Secondary | ICD-10-CM | POA: Diagnosis not present

## 2020-06-24 DIAGNOSIS — B351 Tinea unguium: Secondary | ICD-10-CM | POA: Diagnosis not present

## 2020-06-24 DIAGNOSIS — L989 Disorder of the skin and subcutaneous tissue, unspecified: Secondary | ICD-10-CM

## 2020-06-24 DIAGNOSIS — M79674 Pain in right toe(s): Secondary | ICD-10-CM | POA: Diagnosis not present

## 2020-06-24 DIAGNOSIS — M79675 Pain in left toe(s): Secondary | ICD-10-CM | POA: Diagnosis not present

## 2020-06-24 NOTE — Progress Notes (Signed)
This patient presents the office with chief complaint of a painful callus on the top of his right foot this callus has formed above the incision on the top of the right foot for which the fifth digit and ray was removed.  He also has thick painful nails noted on both feet.  His feet are painful walking and wearing his shoes.  He also has a history of second and fifth toe amputations.  The callus that has developed on the dorsum outside of his right foot is extremely painful.  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  He presents the office today for an evaluation and treatment.     General Appearance  Alert, conversant and in no acute stress.  Vascular  Dorsalis pedis is  palpable  Right foot.  Dorsalis pedis and posterior tibial pulses  B/L are absent.  Capillary return is within normal limits  Bilaterally.Cold feet noted  bilaterally.  Neurologic  Senn-Weinstein monofilament wire test within normal limits  bilaterally. Muscle power within normal limits bilaterally.  Nails Thick disfigured discolored nails with subungual debris  from hallux to fifth toes bilaterally. No evidence of bacterial infection or drainage bilaterally.  Amputation second and fifth toe right foot.  Orthopedic  No limitations of motion  feet .  No crepitus or effusions noted.  No bony pathology or digital deformities noted.  Skin  normotropic skin with no porokeratosis noted bilaterally.  No signs of infections or ulcers noted.  Callus on dorsum of incision fifth met dorsally right foot.  Callus noted dorsum right foot.  Onychomycosis  B/L  Debridement of thick disfigured discolored nails  B/L with nail nipper and dremel tool.  Debride callus with # 15 blade .  Patient could not tolerate dremel tool usage right foot.  RTC 10 weeks.   Gardiner Barefoot DPM

## 2020-09-30 ENCOUNTER — Ambulatory Visit: Payer: Non-veteran care | Admitting: Podiatry

## 2020-10-03 ENCOUNTER — Ambulatory Visit: Payer: Non-veteran care | Admitting: Podiatry

## 2020-10-10 ENCOUNTER — Encounter: Payer: Self-pay | Admitting: Podiatry

## 2020-10-10 ENCOUNTER — Ambulatory Visit: Payer: Medicare Other | Admitting: Podiatry

## 2020-10-10 ENCOUNTER — Other Ambulatory Visit: Payer: Self-pay

## 2020-10-10 DIAGNOSIS — M79674 Pain in right toe(s): Secondary | ICD-10-CM | POA: Diagnosis not present

## 2020-10-10 DIAGNOSIS — B351 Tinea unguium: Secondary | ICD-10-CM | POA: Diagnosis not present

## 2020-10-10 DIAGNOSIS — L989 Disorder of the skin and subcutaneous tissue, unspecified: Secondary | ICD-10-CM | POA: Diagnosis not present

## 2020-10-10 DIAGNOSIS — I739 Peripheral vascular disease, unspecified: Secondary | ICD-10-CM

## 2020-10-10 DIAGNOSIS — M79675 Pain in left toe(s): Secondary | ICD-10-CM | POA: Diagnosis not present

## 2020-10-10 NOTE — Progress Notes (Signed)
This patient presents the office with chief complaint of a painful callus on the top of his right foot this callus has formed above the incision on the top of the right foot for which the fifth digit and ray was removed.  He also has thick painful nails noted on both feet.  His feet are painful walking and wearing his shoes.  He also has a history of second and fifth toe amputations.  The callus that has developed on the dorsum outside of his right foot is extremely painful.  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  He presents the office today for an evaluation and treatment.     General Appearance  Alert, conversant and in no acute stress.  Vascular  Dorsalis pedis is  palpable  Right foot. Posterior tibial pulses are absent right foot. Dorsalis pedis and posterior tibial pulses  left  are absent.  Capillary return is within normal limits  Bilaterally.  Cold feet noted  bilaterally.  Neurologic  Senn-Weinstein monofilament wire test within normal limits  bilaterally. Muscle power within normal limits bilaterally.  Nails Thick disfigured discolored nails with subungual debris  from hallux to fifth toes left foot.  Onychomycosis 1,3,4 right. No evidence of bacterial infection or drainage bilaterally.    Orthopedic  No limitations of motion  feet .  No crepitus or effusions noted.  No bony pathology or digital deformities noted. Amputation 2.5 digits right foot.  Skin  normotropic skin with no porokeratosis noted bilaterally.  No signs of infections or ulcers noted.  Callus on dorsum of incision fifth met dorsally right foot.  Callus noted dorsum right foot.  Onychomycosis  B/L  Debridement of thick disfigured discolored nails  B/L with nail nipper and dremel tool.  Debride callus with dremel tool. Patient states his foot is improving with each visit.  Told to use moisturizer at callus/cicatrix  site.  RTC 4 months    Gardiner Barefoot DPM

## 2020-11-25 IMAGING — DX DG CHEST 1V PORT
1 series · 2 of 2 positions shown · non-contrast
Comparison: November 16, 2008.

CLINICAL DATA: Shortness of breath and cough

EXAM:
PORTABLE CHEST 1 VIEW

[Series 1: chest ap · 0.14mm/px · 2 of 2 slices shown]
[im 1/2]
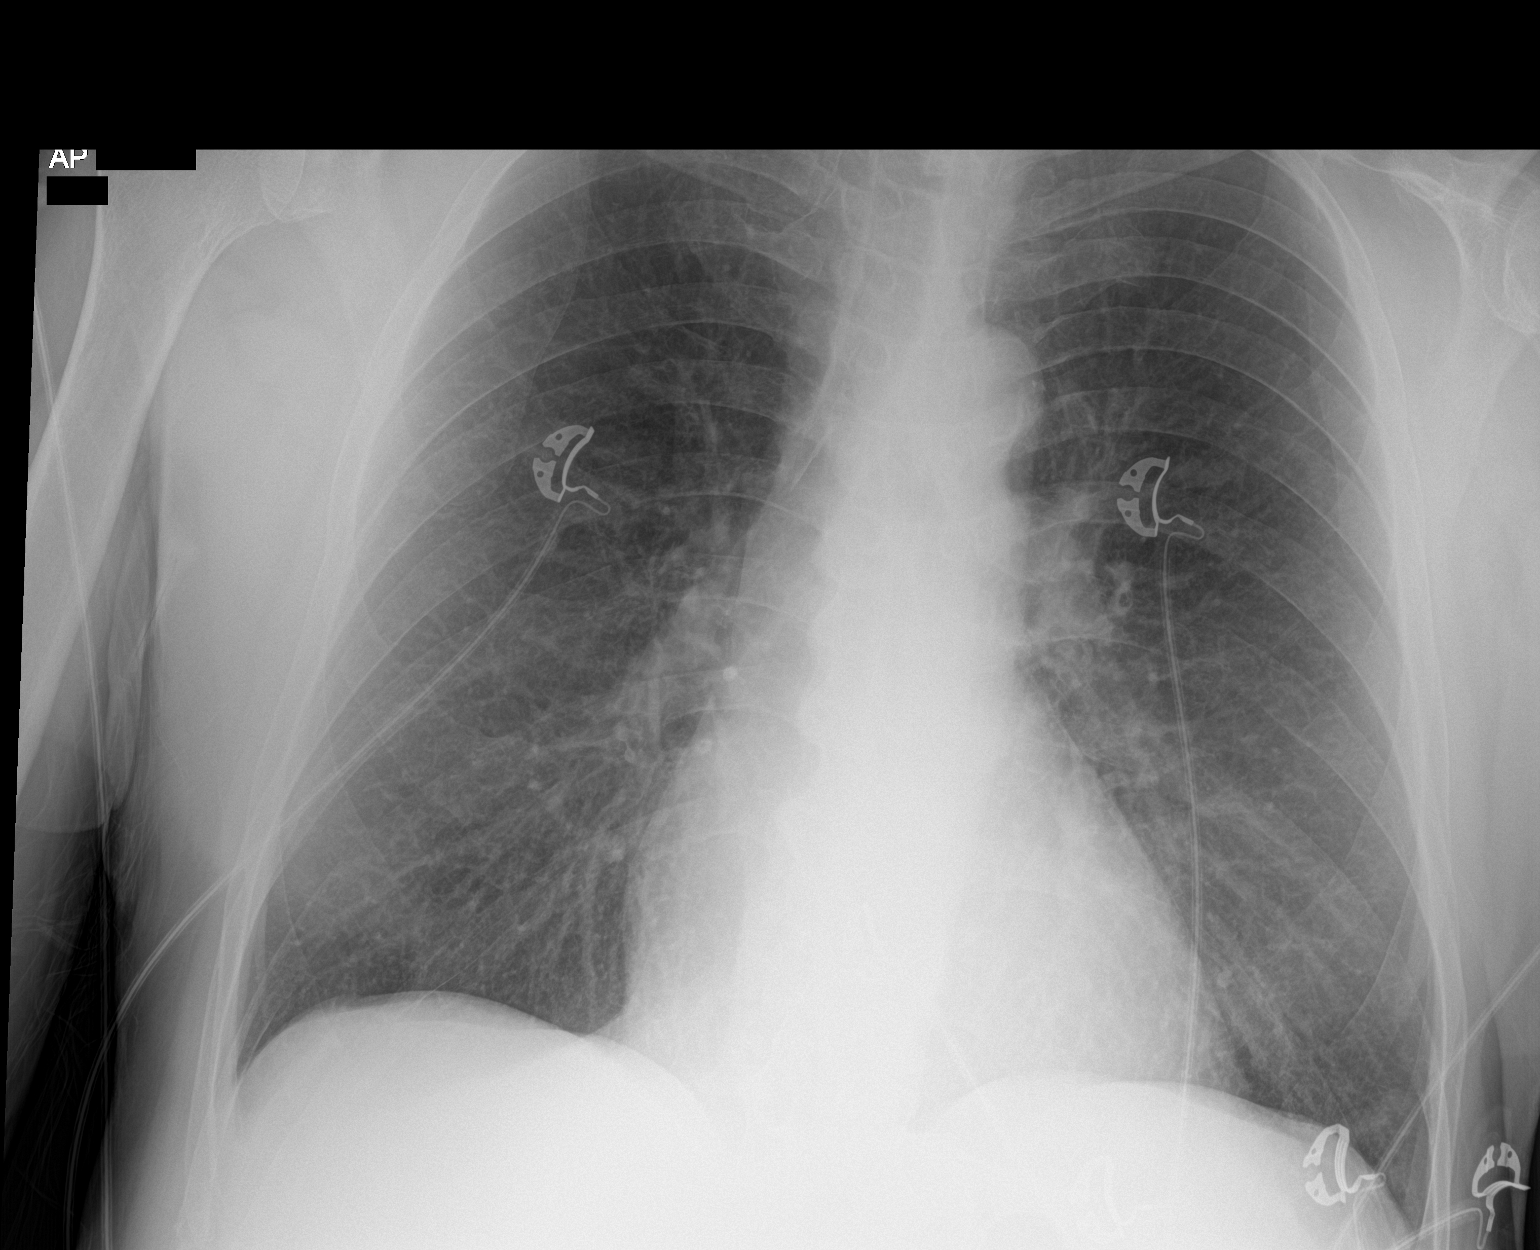
[im 2/2]
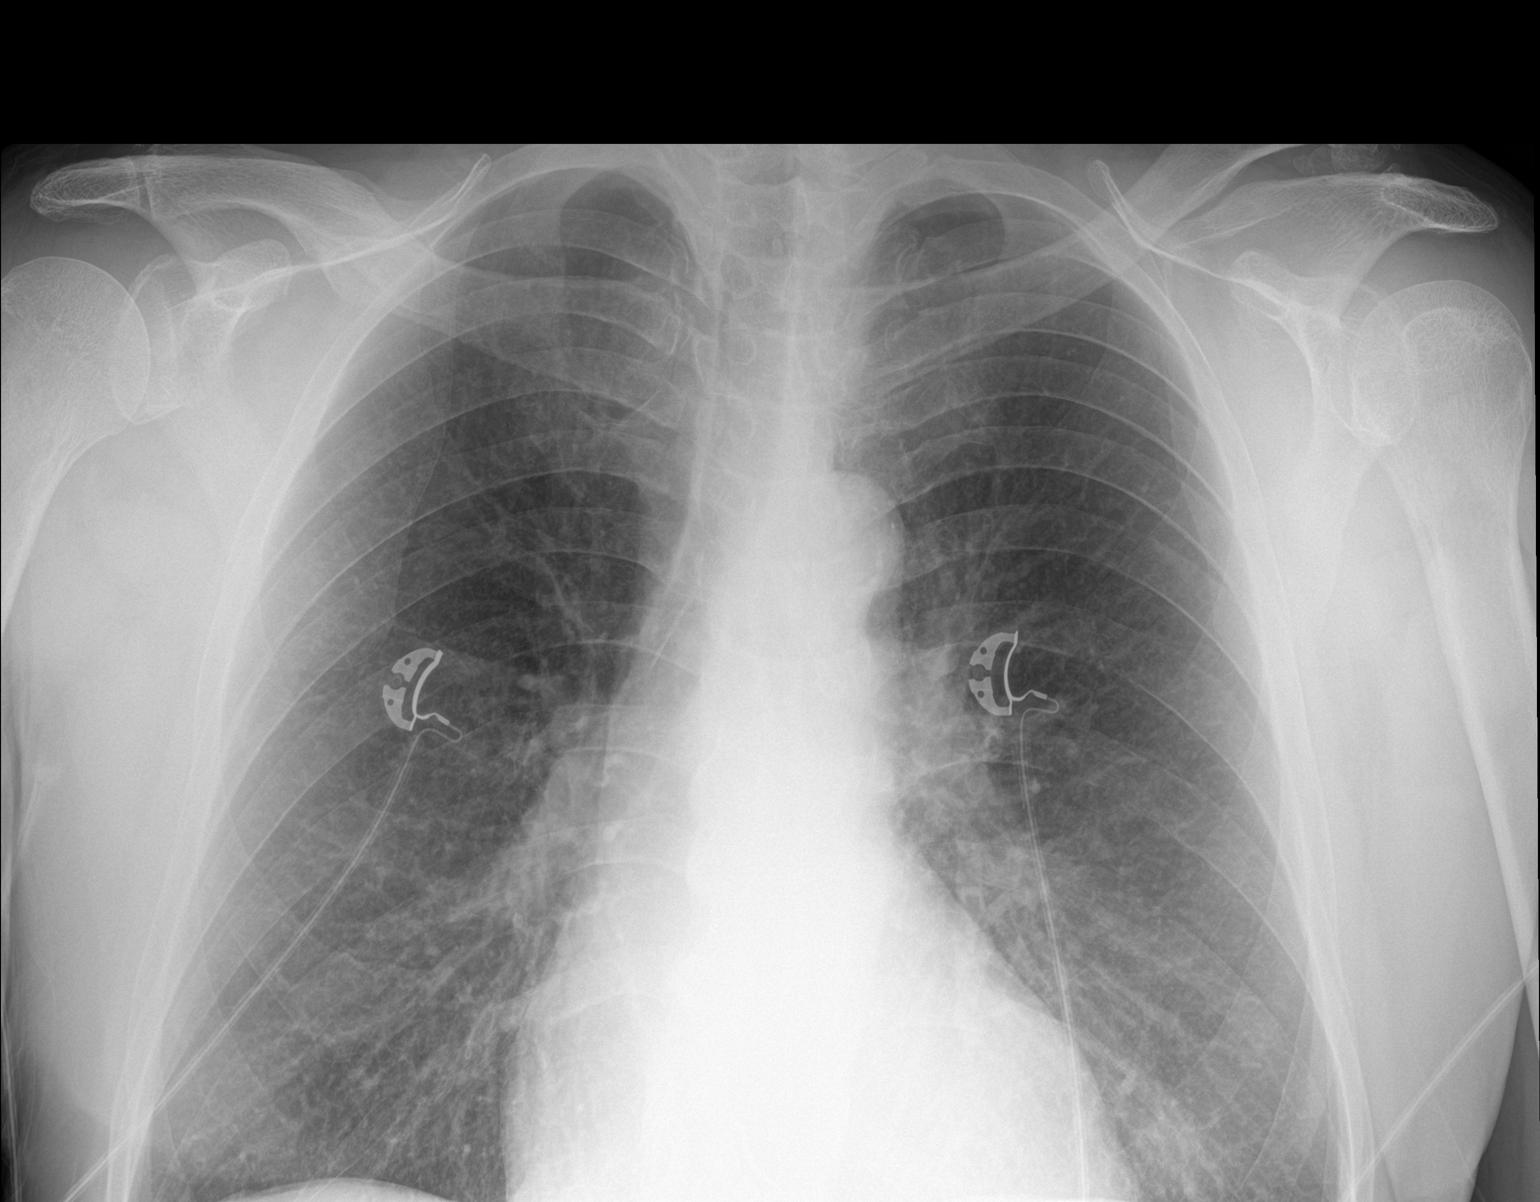

[2 of 2 positions shown; findings below may reference images not displayed]

FINDINGS: There is no appreciable edema or consolidation. Heart is upper
normal in size with pulmonary vascularity normal. No adenopathy.
There is aortic atherosclerosis. No evident bone lesions.
IMPRESSION: No edema or consolidation.  Stable cardiac silhouette.

Aortic Atherosclerosis ([id number]).

## 2021-02-13 ENCOUNTER — Ambulatory Visit: Payer: Non-veteran care | Admitting: Podiatry

## 2021-02-24 ENCOUNTER — Encounter (INDEPENDENT_AMBULATORY_CARE_PROVIDER_SITE_OTHER): Payer: Self-pay

## 2021-02-24 ENCOUNTER — Encounter: Payer: Self-pay | Admitting: Podiatry

## 2021-02-24 ENCOUNTER — Other Ambulatory Visit: Payer: Self-pay

## 2021-02-24 ENCOUNTER — Ambulatory Visit (INDEPENDENT_AMBULATORY_CARE_PROVIDER_SITE_OTHER): Payer: No Typology Code available for payment source | Admitting: Podiatry

## 2021-02-24 DIAGNOSIS — L989 Disorder of the skin and subcutaneous tissue, unspecified: Secondary | ICD-10-CM

## 2021-02-24 DIAGNOSIS — B351 Tinea unguium: Secondary | ICD-10-CM

## 2021-02-24 DIAGNOSIS — M79674 Pain in right toe(s): Secondary | ICD-10-CM | POA: Diagnosis not present

## 2021-02-24 DIAGNOSIS — I739 Peripheral vascular disease, unspecified: Secondary | ICD-10-CM

## 2021-02-24 DIAGNOSIS — M79675 Pain in left toe(s): Secondary | ICD-10-CM | POA: Diagnosis not present

## 2021-02-24 NOTE — Progress Notes (Signed)
This patient presents the office with chief complaint of a painful callus on the top of his right foot this callus has formed above the incision on the top of the right foot for which the fifth digit and ray was removed.  He also has thick painful nails noted on both feet.  His feet are painful walking and wearing his shoes.  He also has a history of second and fifth toe amputations.  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  He presents the office today for an evaluation and treatment.     General Appearance  Alert, conversant and in no acute stress.  Vascular  Dorsalis pedis is  palpable  Right foot. Posterior tibial pulses are absent right foot. Dorsalis pedis and posterior tibial pulses  left  are absent.  Capillary return is within normal limits  Bilaterally.  Cold feet noted  bilaterally.  Neurologic  Senn-Weinstein monofilament wire test within normal limits  bilaterally. Muscle power within normal limits bilaterally.  Nails Thick disfigured discolored nails with subungual debris  from hallux to fifth toes left foot.  Onychomycosis 1,3,4 right. No evidence of bacterial infection or drainage bilaterally.    Orthopedic  No limitations of motion  feet .  No crepitus or effusions noted.  No bony pathology or digital deformities noted. Amputation 2.5 digits right foot.  Skin  normotropic skin with no porokeratosis noted bilaterally.  No signs of infections or ulcers noted.  Callus on dorsum of incision fifth met dorsally right foot.  Callus noted dorsum right foot.  Onychomycosis  B/L  Debridement of thick disfigured discolored nails  B/L with nail nipper and dremel tool.  Debride callus with dremel tool. Patient states his foot is improving with each visit.  Told to use moisturizer at callus/cicatrix  site.  RTC 3 months    Gardiner Barefoot DPM

## 2021-06-02 ENCOUNTER — Ambulatory Visit: Payer: Non-veteran care | Admitting: Podiatry

## 2021-06-18 DIAGNOSIS — E119 Type 2 diabetes mellitus without complications: Secondary | ICD-10-CM

## 2021-06-18 LAB — GLUCOSE, POCT (MANUAL RESULT ENTRY): POC Glucose: 129 mg/dl — AB (ref 70–99)

## 2021-06-18 NOTE — Congregational Nurse Program (Signed)
?  Dept: [phone] ? ? ?Congregational Nurse Program Note ? ?Date of Encounter: 06/18/2021 ? ?Past Medical History: ?No past medical history on file. ? ?Encounter Details: ? CNP Questionnaire - 06/18/21 1330   ? ?  ? Questionnaire  ? Do you give verbal consent to treat you today? Yes   ? Location Patient Served  Not Applicable   ? Visit Setting Church or Organization   ? Patient Status Unknown   ? Engineer, building services or [employer]   ? Insurance Referral N/A   ? Medication N/A   ? Medical Provider Yes   ? Screening Referrals N/A   ? Medical Referral N/A   ? Medical Appointment Made N/A   ? Food Have Food Insecurities   ? Transportation N/A   ? Housing/Utilities N/A   ? Interpersonal Safety N/A   ? Intervention Blood pressure;Blood glucose;Counsel;Educate   ? ED Visit Averted N/A   ? Life-Saving Intervention Made N/A   ? ?  ?  ? ?  ? ?[facility]. Agrees to screenings and understands confidentiality. Has VA insurance and PCP. Med history includes Heart condition, hypertension, diabetes, circulatory issues with toe amputations. Long term smoker. Smokes 1 1/2 ppd and drinks 12 light beers a day. States he doesn't have plans to change his life style except willing to cut down 1 cigarette/day. States he takes BP meds. Blood thinner, cholesterol meds, diabetic pills as prescribed. Has an upcoming appt at [facility] for follow up. Today glucose 129 nonfasting; BP 130/70. States he feels good about those values. Co. Re: basic info on glucose and BP values (including ETOH on glucose levels). Encouraged follow up in this clinic prn. Rhermann, RN ? ? ?

## 2021-09-11 ENCOUNTER — Encounter: Payer: Self-pay | Admitting: Podiatry

## 2021-09-11 ENCOUNTER — Ambulatory Visit: Payer: Medicare Other | Admitting: Podiatry

## 2021-09-11 DIAGNOSIS — I739 Peripheral vascular disease, unspecified: Secondary | ICD-10-CM | POA: Diagnosis not present

## 2021-09-11 DIAGNOSIS — M79674 Pain in right toe(s): Secondary | ICD-10-CM

## 2021-09-11 DIAGNOSIS — B351 Tinea unguium: Secondary | ICD-10-CM | POA: Diagnosis not present

## 2021-09-11 DIAGNOSIS — L989 Disorder of the skin and subcutaneous tissue, unspecified: Secondary | ICD-10-CM | POA: Diagnosis not present

## 2021-09-11 DIAGNOSIS — M79675 Pain in left toe(s): Secondary | ICD-10-CM

## 2021-09-11 NOTE — Progress Notes (Signed)
This patient presents the office with chief complaint of a painful callus on the top of his right foot this callus has formed above the incision on the top of the right foot for which the fifth digit and ray was removed.  He also has thick painful nails noted on both feet.  His feet are painful walking and wearing his shoes.  He also has a history of second and fifth toe amputations.  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  He presents the office today for an evaluation and treatment.     General Appearance  Alert, conversant and in no acute stress.  Vascular  Dorsalis pedis is  palpable  Right foot. Posterior tibial pulses are absent right foot. Dorsalis pedis and posterior tibial pulses  left  are absent.  Capillary return is within normal limits  Bilaterally.  Cold feet noted  bilaterally.  Neurologic  Senn-Weinstein monofilament wire test within normal limits  bilaterally. Muscle power within normal limits bilaterally.  Nails Thick disfigured discolored nails with subungual debris  from hallux to fifth toes left foot.  Onychomycosis 1,3,4 right. No evidence of bacterial infection or drainage bilaterally.    Orthopedic  No limitations of motion  feet .  No crepitus or effusions noted.  No bony pathology or digital deformities noted. Amputation 2.5 digits right foot.  Skin  normotropic skin with no porokeratosis noted bilaterally.  No signs of infections or ulcers noted.  Callus on dorsum of incision fifth met dorsally right foot.  Callus noted dorsum right foot.  Onychomycosis  B/L  Debridement of thick disfigured discolored nails  B/L with nail nipper and dremel tool.  Debride callus with dremel tool. Patient states his foot is improving with each visit.  Told to use moisturizer at callus/cicatrix  site.  RTC 3 months    Gardiner Barefoot DPM

## 2022-03-20 ENCOUNTER — Inpatient Hospital Stay
Admission: EM | Admit: 2022-03-20 | Discharge: 2022-03-23 | DRG: 280 | Disposition: A | Discharge status: Home / Self Care | Payer: No Typology Code available for payment source | Attending: Internal Medicine | Admitting: Internal Medicine

## 2022-03-20 ENCOUNTER — Encounter
Admission: EM | Disposition: A | Discharge status: Home / Self Care | Payer: Self-pay | Source: Home / Self Care | Attending: Internal Medicine

## 2022-03-20 DIAGNOSIS — J449 Chronic obstructive pulmonary disease, unspecified: Secondary | ICD-10-CM | POA: Diagnosis present

## 2022-03-20 DIAGNOSIS — I493 Ventricular premature depolarization: Secondary | ICD-10-CM | POA: Diagnosis present

## 2022-03-20 DIAGNOSIS — Z7901 Long term (current) use of anticoagulants: Secondary | ICD-10-CM

## 2022-03-20 DIAGNOSIS — E1142 Type 2 diabetes mellitus with diabetic polyneuropathy: Secondary | ICD-10-CM | POA: Diagnosis present

## 2022-03-20 DIAGNOSIS — F32A Depression, unspecified: Secondary | ICD-10-CM | POA: Diagnosis present

## 2022-03-20 DIAGNOSIS — I110 Hypertensive heart disease with heart failure: Secondary | ICD-10-CM | POA: Diagnosis present

## 2022-03-20 DIAGNOSIS — G894 Chronic pain syndrome: Secondary | ICD-10-CM | POA: Diagnosis present

## 2022-03-20 DIAGNOSIS — Z7982 Long term (current) use of aspirin: Secondary | ICD-10-CM

## 2022-03-20 DIAGNOSIS — I4891 Unspecified atrial fibrillation: Secondary | ICD-10-CM | POA: Diagnosis present

## 2022-03-20 DIAGNOSIS — E785 Hyperlipidemia, unspecified: Secondary | ICD-10-CM | POA: Diagnosis present

## 2022-03-20 DIAGNOSIS — Z7902 Long term (current) use of antithrombotics/antiplatelets: Secondary | ICD-10-CM

## 2022-03-20 DIAGNOSIS — Z89421 Acquired absence of other right toe(s): Secondary | ICD-10-CM

## 2022-03-20 DIAGNOSIS — I214 Non-ST elevation (NSTEMI) myocardial infarction: Principal | ICD-10-CM | POA: Diagnosis present

## 2022-03-20 DIAGNOSIS — Z79899 Other long term (current) drug therapy: Secondary | ICD-10-CM

## 2022-03-20 DIAGNOSIS — I5043 Acute on chronic combined systolic (congestive) and diastolic (congestive) heart failure: Secondary | ICD-10-CM | POA: Diagnosis present

## 2022-03-20 DIAGNOSIS — E1151 Type 2 diabetes mellitus with diabetic peripheral angiopathy without gangrene: Secondary | ICD-10-CM | POA: Diagnosis present

## 2022-03-20 DIAGNOSIS — I509 Heart failure, unspecified: Secondary | ICD-10-CM

## 2022-03-20 DIAGNOSIS — Z7984 Long term (current) use of oral hypoglycemic drugs: Secondary | ICD-10-CM

## 2022-03-20 DIAGNOSIS — F10139 Alcohol abuse with withdrawal, unspecified: Secondary | ICD-10-CM | POA: Diagnosis present

## 2022-03-20 DIAGNOSIS — I4892 Unspecified atrial flutter: Secondary | ICD-10-CM | POA: Diagnosis present

## 2022-03-20 DIAGNOSIS — F17210 Nicotine dependence, cigarettes, uncomplicated: Secondary | ICD-10-CM | POA: Diagnosis present

## 2022-03-20 LAB — CBC WITH DIFFERENTIAL/PLATELET
Abs Immature Granulocytes: 0.01 10*3/uL (ref 0.00–0.07)
Basophils Absolute: 0.1 10*3/uL (ref 0.0–0.1)
Basophils Relative: 1 %
Eosinophils Absolute: 0.1 10*3/uL (ref 0.0–0.5)
Eosinophils Relative: 2 %
HCT: 39 % (ref 39.0–52.0)
Hemoglobin: 12.6 g/dL — ABNORMAL LOW (ref 13.0–17.0)
Immature Granulocytes: 0 %
Lymphocytes Relative: 32 %
Lymphs Abs: 2.4 10*3/uL (ref 0.7–4.0)
MCH: 28.4 pg (ref 26.0–34.0)
MCHC: 32.3 g/dL (ref 30.0–36.0)
MCV: 87.8 fL (ref 80.0–100.0)
Monocytes Absolute: 0.7 10*3/uL (ref 0.1–1.0)
Monocytes Relative: 10 %
Neutro Abs: 4.2 10*3/uL (ref 1.7–7.7)
Neutrophils Relative %: 55 %
Platelets: 313 10*3/uL (ref 150–400)
RBC: 4.44 MIL/uL (ref 4.22–5.81)
RDW: 15.4 % (ref 11.5–15.5)
WBC: 7.5 10*3/uL (ref 4.0–10.5)
nRBC: 0 % (ref 0.0–0.2)

## 2022-03-20 LAB — PROTIME-INR
INR: 1.3 — ABNORMAL HIGH (ref 0.8–1.2)
Prothrombin Time: 15.7 seconds — ABNORMAL HIGH (ref 11.4–15.2)

## 2022-03-20 LAB — APTT: aPTT: 36 seconds (ref 24–36)

## 2022-03-20 SURGERY — CORONARY/GRAFT ACUTE MI REVASCULARIZATION
Anesthesia: Moderate Sedation

## 2022-03-20 MED ORDER — HEPARIN SODIUM (PORCINE) 1000 UNIT/ML IJ SOLN
INTRAMUSCULAR | Status: AC
  Filled 2022-03-20: qty 10

## 2022-03-20 MED ORDER — SODIUM CHLORIDE 0.9 % IV SOLN: INTRAVENOUS | Status: DC

## 2022-03-20 MED ORDER — DILTIAZEM HCL 25 MG/5ML IV SOLN
INTRAVENOUS | Status: AC
  Filled 2022-03-20: qty 5

## 2022-03-20 MED ORDER — VERAPAMIL HCL 2.5 MG/ML IV SOLN
INTRAVENOUS | Status: AC
  Filled 2022-03-20: qty 2

## 2022-03-20 MED ORDER — HEPARIN (PORCINE) IN NACL 1000-0.9 UT/500ML-% IV SOLN
INTRAVENOUS | Status: AC
  Filled 2022-03-20: qty 1000

## 2022-03-20 MED ORDER — MIDAZOLAM HCL 2 MG/2ML IJ SOLN
INTRAMUSCULAR | Status: AC
  Filled 2022-03-20: qty 2

## 2022-03-20 MED ORDER — HEPARIN SODIUM (PORCINE) 5000 UNIT/ML IJ SOLN
4000.0000 [IU] | Freq: Once | INTRAMUSCULAR | Status: AC
  Administered 2022-03-20: 4000 [IU] via INTRAVENOUS
  Filled 2022-03-20: qty 1

## 2022-03-20 MED ORDER — DILTIAZEM HCL 25 MG/5ML IV SOLN
10.0000 mg | Freq: Once | INTRAVENOUS | Status: AC
  Administered 2022-03-20: 10 mg via INTRAVENOUS

## 2022-03-20 MED ORDER — FENTANYL CITRATE (PF) 100 MCG/2ML IJ SOLN
INTRAMUSCULAR | Status: AC
  Filled 2022-03-20: qty 2

## 2022-03-20 MED ORDER — LIDOCAINE HCL 1 % IJ SOLN
INTRAMUSCULAR | Status: AC
  Filled 2022-03-20: qty 20

## 2022-03-20 NOTE — Progress Notes (Signed)
  Chaplain On-Call responded to Code STEMI notification at 2249 hours.  The patient is receiving urgent care at bedside from medical team.  No family is present.  Chaplain is available for additional support if requested.  Chaplain Evelena Peat M.Div., [facility]

## 2022-03-20 NOTE — ED Provider Notes (Signed)
[facility] Provider Note    Event Date/Time   First MD Initiated Contact with Patient 03/20/22 2306     (approximate)   History   Chest Pain (Patient C/O chest pain and SOB that woke him from his sleep this evening)   HPI {Remember to add pertinent medical, surgical, social, and/or OB history to HPI:1} Jason Beck is a 66 y.o. male  ***       Physical Exam   Triage Vital Signs: ED Triage Vitals  Enc Vitals Group     BP 03/20/22 2309 (!) 134/118     Pulse Rate 03/20/22 2309 98     Resp 03/20/22 2309 (!) 29     Temp 03/20/22 2313 97.9 F (36.6 C)     Temp Source 03/20/22 2309 Oral     SpO2 03/20/22 2309 95 %     Weight 03/20/22 2311 77.1 kg (170 lb)     Height 03/20/22 2311 1.778 m (5\' 10" )     Head Circumference --      Peak Flow --      Pain Score --      Pain Loc --      Pain Edu? --      Excl. in [place]? --     Most recent vital signs: Vitals:   03/20/22 2313 03/20/22 2330  BP:  (!) 126/53  Pulse:  (!) 57  Resp:  16  Temp: 97.9 F (36.6 C)   SpO2:  93%    {Only need to document appropriate and relevant physical exam:1} General: Awake, no distress. *** CV:  Good peripheral perfusion. *** Resp:  Normal effort. *** Abd:  No distention. *** Other:  ***   ED Results / Procedures / Treatments   Labs (all labs ordered are listed, but only abnormal results are displayed) Labs Reviewed  PROTIME-INR - Abnormal; Notable for the following components:      Result Value   Prothrombin Time 15.7 (*)    INR 1.3 (*)    All other components within normal limits  APTT  HEMOGLOBIN A1C  CBC WITH DIFFERENTIAL/PLATELET  COMPREHENSIVE METABOLIC PANEL  LIPID PANEL  MAGNESIUM  BRAIN NATRIURETIC PEPTIDE  TROPONIN I (HIGH SENSITIVITY)     EKG  ***   RADIOLOGY *** {USE THE WORD "INTERPRETED"!! You MUST document your own interpretation of imaging, as well as the fact that you reviewed the radiologist's  report!:1}   PROCEDURES:  Critical Care performed: {CriticalCareYesNo:19197::"Yes, see critical care procedure note(s)","No"}  .Critical Care  Performed by: Hinda Kehr, MD Authorized by: Hinda Kehr, MD   Critical care provider statement:    Critical care time (minutes):  45   Critical care time was exclusive of:  Separately billable procedures and treating other patients   Critical care was necessary to treat or prevent imminent or life-threatening deterioration of the following conditions:  Circulatory failure and cardiac failure (code STEMI)   Critical care was time spent personally by me on the following activities:  Development of treatment plan with patient or surrogate, evaluation of patient's response to treatment, examination of patient, obtaining history from patient or surrogate, ordering and performing treatments and interventions, ordering and review of laboratory studies, ordering and review of radiographic studies, pulse oximetry, re-evaluation of patient's condition and review of old charts .1-3 Lead EKG Interpretation  Performed by: Hinda Kehr, MD Authorized by: Hinda Kehr, MD      MEDICATIONS ORDERED IN ED: Medications  0.9 %  sodium chloride  infusion ( Intravenous New Bag/Given 03/20/22 2333)  heparin injection 4,000 Units (4,000 Units Intravenous Given 03/20/22 2318)  diltiazem (CARDIZEM) injection 10 mg (10 mg Intravenous Given 03/20/22 2323)     IMPRESSION / MDM / ASSESSMENT AND PLAN / ED COURSE  I reviewed the triage vital signs and the nursing notes.                              Differential diagnosis includes, but is not limited to, ***  Patient's presentation is most consistent with {EM COPA:27473}  *** {If the patient is on the monitor, remove the brackets and asterisks on the sentence below and remember to document it as a Procedure as well. Otherwise delete the sentence below:1} {**The patient is on the cardiac monitor to evaluate for  evidence of arrhythmia and/or significant heart rate changes.**} {Remember to include, when applicable, any/all of the following data: independent review of imaging independent review of labs (comment specifically on pertinent positives and negatives) review of specific prior hospitalizations, PCP/specialist notes, etc. discuss meds given and prescribed document any discussion with consultants (including hospitalists) any clinical decision tools you used and why (PECARN, NEXUS, etc.) did you consider admitting the patient? document social determinants of health affecting patient's care (homelessness, inability to follow up in a timely fashion, etc) document any pre-existing conditions increasing risk on current visit (e.g. diabetes and HTN increasing danger of high-risk chest pain/ACS) describes what meds you gave (especially parenteral) and why any other interventions?:1} Clinical Course as of 03/21/22 0000  Fri Mar 20, 2022  2334 Dr. Juliann Pares consulted on the patient in person and discussed the case with me.  He does not feel that the patient meets STEMI criteria or needs to be taken emergently to the Cath Lab.  The patient is feeling much better at this time and he may be having some rate related ischemia but I agree it does not appear like a "traditional" STEMI.  Dr. Juliann Pares recommends a normal cardiac work-up and admission to the hospitalist service [CF]  2359 I discussed the plan with the patient and he agrees.  He pointed out that he is a [place] patient but he said he does not have full benefits and he does not want to be transferred to the [place].  He does not even want me to call them, so once I have information back I will consult the hospitalist for admission [CF]    Clinical Course User Index [CF] Loleta Rose, MD     FINAL CLINICAL IMPRESSION(S) / ED DIAGNOSES   Final diagnoses:  None     Rx / DC Orders   ED Discharge Orders     None        Note:  This document was  prepared using Dragon voice recognition software and may include unintentional dictation errors.

## 2022-03-21 ENCOUNTER — Encounter: Payer: Self-pay | Admitting: Internal Medicine

## 2022-03-21 ENCOUNTER — Emergency Department: Payer: No Typology Code available for payment source

## 2022-03-21 ENCOUNTER — Other Ambulatory Visit: Payer: Self-pay

## 2022-03-21 ENCOUNTER — Inpatient Hospital Stay
Admit: 2022-03-21 | Discharge: 2022-03-21 | Disposition: A | Discharge status: Home / Self Care | Payer: No Typology Code available for payment source | Attending: Family Medicine | Admitting: Family Medicine

## 2022-03-21 DIAGNOSIS — Z89421 Acquired absence of other right toe(s): Secondary | ICD-10-CM | POA: Diagnosis not present

## 2022-03-21 DIAGNOSIS — I4891 Unspecified atrial fibrillation: Secondary | ICD-10-CM

## 2022-03-21 DIAGNOSIS — E1151 Type 2 diabetes mellitus with diabetic peripheral angiopathy without gangrene: Secondary | ICD-10-CM | POA: Diagnosis present

## 2022-03-21 DIAGNOSIS — E1142 Type 2 diabetes mellitus with diabetic polyneuropathy: Secondary | ICD-10-CM

## 2022-03-21 DIAGNOSIS — I5043 Acute on chronic combined systolic (congestive) and diastolic (congestive) heart failure: Secondary | ICD-10-CM | POA: Diagnosis present

## 2022-03-21 DIAGNOSIS — I5031 Acute diastolic (congestive) heart failure: Secondary | ICD-10-CM | POA: Diagnosis not present

## 2022-03-21 DIAGNOSIS — E785 Hyperlipidemia, unspecified: Secondary | ICD-10-CM

## 2022-03-21 DIAGNOSIS — Z7902 Long term (current) use of antithrombotics/antiplatelets: Secondary | ICD-10-CM | POA: Diagnosis not present

## 2022-03-21 DIAGNOSIS — Z7901 Long term (current) use of anticoagulants: Secondary | ICD-10-CM | POA: Diagnosis not present

## 2022-03-21 DIAGNOSIS — F32A Depression, unspecified: Secondary | ICD-10-CM | POA: Diagnosis present

## 2022-03-21 DIAGNOSIS — I214 Non-ST elevation (NSTEMI) myocardial infarction: Secondary | ICD-10-CM

## 2022-03-21 DIAGNOSIS — Z7982 Long term (current) use of aspirin: Secondary | ICD-10-CM | POA: Diagnosis not present

## 2022-03-21 DIAGNOSIS — I4892 Unspecified atrial flutter: Secondary | ICD-10-CM | POA: Diagnosis present

## 2022-03-21 DIAGNOSIS — I493 Ventricular premature depolarization: Secondary | ICD-10-CM | POA: Diagnosis present

## 2022-03-21 DIAGNOSIS — G894 Chronic pain syndrome: Secondary | ICD-10-CM | POA: Diagnosis present

## 2022-03-21 DIAGNOSIS — I509 Heart failure, unspecified: Secondary | ICD-10-CM

## 2022-03-21 DIAGNOSIS — F10139 Alcohol abuse with withdrawal, unspecified: Secondary | ICD-10-CM | POA: Diagnosis present

## 2022-03-21 DIAGNOSIS — J449 Chronic obstructive pulmonary disease, unspecified: Secondary | ICD-10-CM | POA: Diagnosis present

## 2022-03-21 DIAGNOSIS — F17210 Nicotine dependence, cigarettes, uncomplicated: Secondary | ICD-10-CM | POA: Diagnosis present

## 2022-03-21 DIAGNOSIS — Z7984 Long term (current) use of oral hypoglycemic drugs: Secondary | ICD-10-CM | POA: Diagnosis not present

## 2022-03-21 DIAGNOSIS — I110 Hypertensive heart disease with heart failure: Secondary | ICD-10-CM | POA: Diagnosis present

## 2022-03-21 DIAGNOSIS — Z79899 Other long term (current) drug therapy: Secondary | ICD-10-CM | POA: Diagnosis not present

## 2022-03-21 LAB — CBC
HCT: 42 % (ref 39.0–52.0)
Hemoglobin: 13.3 g/dL (ref 13.0–17.0)
MCH: 28.1 pg (ref 26.0–34.0)
MCHC: 31.7 g/dL (ref 30.0–36.0)
MCV: 88.8 fL (ref 80.0–100.0)
Platelets: 340 10*3/uL (ref 150–400)
RBC: 4.73 MIL/uL (ref 4.22–5.81)
RDW: 15.5 % (ref 11.5–15.5)
WBC: 10.4 10*3/uL (ref 4.0–10.5)
nRBC: 0 % (ref 0.0–0.2)

## 2022-03-21 LAB — COMPREHENSIVE METABOLIC PANEL
ALT: 16 U/L (ref 0–44)
AST: 22 U/L (ref 15–41)
Albumin: 3.7 g/dL (ref 3.5–5.0)
Alkaline Phosphatase: 51 U/L (ref 38–126)
Anion gap: 8 (ref 5–15)
BUN: 11 mg/dL (ref 8–23)
CO2: 23 mmol/L (ref 22–32)
Calcium: 8.7 mg/dL — ABNORMAL LOW (ref 8.9–10.3)
Chloride: 105 mmol/L (ref 98–111)
Creatinine, Ser: 0.86 mg/dL (ref 0.61–1.24)
GFR, Estimated: >60 mL/min (ref >60)
Glucose, Bld: 113 mg/dL — ABNORMAL HIGH (ref 70–99)
Potassium: 3.5 mmol/L (ref 3.5–5.1)
Sodium: 136 mmol/L (ref 135–145)
Total Bilirubin: 1 mg/dL (ref 0.3–1.2)
Total Protein: 6.9 g/dL (ref 6.5–8.1)

## 2022-03-21 LAB — HEPARIN LEVEL (UNFRACTIONATED)
Heparin Unfractionated: 0.54 IU/mL (ref 0.30–0.70)
Heparin Unfractionated: <0.1 IU/mL — ABNORMAL LOW (ref 0.30–0.70)
Heparin Unfractionated: 0.34 IU/mL (ref 0.30–0.70)

## 2022-03-21 LAB — LIPID PANEL
Cholesterol: 157 mg/dL (ref 0–200)
Cholesterol: 151 mg/dL (ref 0–200)
HDL: 36 mg/dL — ABNORMAL LOW (ref >40)
HDL: 39 mg/dL — ABNORMAL LOW (ref >40)
LDL Cholesterol: 94 mg/dL (ref 0–99)
LDL Cholesterol: 96 mg/dL (ref 0–99)
Total CHOL/HDL Ratio: 4.4 RATIO
Total CHOL/HDL Ratio: 3.9 RATIO
Triglycerides: 135 mg/dL (ref <150)
Triglycerides: 82 mg/dL (ref <150)
VLDL: 27 mg/dL (ref 0–40)
VLDL: 16 mg/dL (ref 0–40)

## 2022-03-21 LAB — TROPONIN I (HIGH SENSITIVITY)
Troponin I (High Sensitivity): 196 ng/L (ref <18)
Troponin I (High Sensitivity): 720 ng/L (ref <18)
Troponin I (High Sensitivity): 824 ng/L (ref <18)
Troponin I (High Sensitivity): 707 ng/L (ref <18)
Troponin I (High Sensitivity): 518 ng/L (ref <18)
Troponin I (High Sensitivity): 573 ng/L (ref <18)

## 2022-03-21 LAB — ECHOCARDIOGRAM COMPLETE
AR max vel: 0.6 cm2
AV Area VTI: 0.62 cm2
AV Area mean vel: 0.55 cm2
AV Mean grad: 19.8 mmHg
AV Peak grad: 34.7 mmHg
Ao pk vel: 2.95 m/s
Area-P 1/2: 1.92 cm2
Height: 70 in
MV VTI: 0.82 cm2
P 1/2 time: 543 msec
S' Lateral: 3.5 cm
Single Plane A4C EF: 41.4 %
Weight: 2720 oz

## 2022-03-21 LAB — URINE DRUG SCREEN, QUALITATIVE (ARMC ONLY)
Amphetamines, Ur Screen: NOT DETECTED
Barbiturates, Ur Screen: NOT DETECTED
Benzodiazepine, Ur Scrn: NOT DETECTED
Cannabinoid 50 Ng, Ur ~~LOC~~: POSITIVE — AB
Cocaine Metabolite,Ur ~~LOC~~: NOT DETECTED
MDMA (Ecstasy)Ur Screen: NOT DETECTED
Methadone Scn, Ur: POSITIVE — AB
Opiate, Ur Screen: NOT DETECTED
Phencyclidine (PCP) Ur S: NOT DETECTED
Tricyclic, Ur Screen: NOT DETECTED

## 2022-03-21 LAB — BASIC METABOLIC PANEL
Anion gap: 6 (ref 5–15)
BUN: 11 mg/dL (ref 8–23)
CO2: 25 mmol/L (ref 22–32)
Calcium: 9 mg/dL (ref 8.9–10.3)
Chloride: 105 mmol/L (ref 98–111)
Creatinine, Ser: 0.9 mg/dL (ref 0.61–1.24)
GFR, Estimated: >60 mL/min (ref >60)
Glucose, Bld: 135 mg/dL — ABNORMAL HIGH (ref 70–99)
Potassium: 4.2 mmol/L (ref 3.5–5.1)
Sodium: 136 mmol/L (ref 135–145)

## 2022-03-21 LAB — CBG MONITORING, ED
Glucose-Capillary: 151 mg/dL — ABNORMAL HIGH (ref 70–99)
Glucose-Capillary: 120 mg/dL — ABNORMAL HIGH (ref 70–99)

## 2022-03-21 LAB — GLUCOSE, CAPILLARY
Glucose-Capillary: 117 mg/dL — ABNORMAL HIGH (ref 70–99)
Glucose-Capillary: 183 mg/dL — ABNORMAL HIGH (ref 70–99)

## 2022-03-21 LAB — MAGNESIUM: Magnesium: 2 mg/dL (ref 1.7–2.4)

## 2022-03-21 LAB — HIV ANTIBODY (ROUTINE TESTING W REFLEX): HIV Screen 4th Generation wRfx: NONREACTIVE

## 2022-03-21 LAB — BRAIN NATRIURETIC PEPTIDE: B Natriuretic Peptide: 695.9 pg/mL — ABNORMAL HIGH (ref 0.0–100.0)

## 2022-03-21 MED ORDER — HEPARIN (PORCINE) 25000 UT/250ML-% IV SOLN
1350.0000 [IU]/h | INTRAVENOUS | Status: DC
  Administered 2022-03-21: 1150 [IU]/h via INTRAVENOUS
  Administered 2022-03-21 – 2022-03-22 (×2): 1350 [IU]/h via INTRAVENOUS
  Filled 2022-03-21 (×3): qty 250

## 2022-03-21 MED ORDER — LISINOPRIL 20 MG PO TABS
20.0000 mg | ORAL_TABLET | Freq: Every day | ORAL | Status: DC
  Administered 2022-03-21 – 2022-03-22 (×2): 20 mg via ORAL
  Filled 2022-03-21 (×2): qty 1
  Filled 2022-03-21: qty 2

## 2022-03-21 MED ORDER — NITROGLYCERIN 0.4 MG SL SUBL
0.4000 mg | SUBLINGUAL_TABLET | SUBLINGUAL | Status: DC | PRN
  Filled 2022-03-21: qty 1

## 2022-03-21 MED ORDER — CLOPIDOGREL BISULFATE 75 MG PO TABS
75.0000 mg | ORAL_TABLET | Freq: Every day | ORAL | Status: DC
  Administered 2022-03-21 – 2022-03-23 (×3): 75 mg via ORAL
  Filled 2022-03-21 (×3): qty 1

## 2022-03-21 MED ORDER — OXYCODONE HCL 5 MG PO TABS
5.0000 mg | ORAL_TABLET | ORAL | Status: DC | PRN
  Administered 2022-03-21: 5 mg via ORAL
  Filled 2022-03-21 (×2): qty 1

## 2022-03-21 MED ORDER — METHADONE HCL 10 MG PO TABS: 10.0000 mg | ORAL_TABLET | Freq: Every day | ORAL | Status: DC

## 2022-03-21 MED ORDER — TRAZODONE HCL 50 MG PO TABS
25.0000 mg | ORAL_TABLET | Freq: Every evening | ORAL | Status: DC | PRN
  Administered 2022-03-21: 25 mg via ORAL
  Filled 2022-03-21: qty 1

## 2022-03-21 MED ORDER — THIAMINE HCL 100 MG/ML IJ SOLN: 100.0000 mg | Freq: Every day | INTRAMUSCULAR | Status: DC

## 2022-03-21 MED ORDER — ATORVASTATIN CALCIUM 80 MG PO TABS
80.0000 mg | ORAL_TABLET | Freq: Every day | ORAL | Status: DC
  Administered 2022-03-21 – 2022-03-23 (×3): 80 mg via ORAL
  Filled 2022-03-21 (×2): qty 1
  Filled 2022-03-21: qty 4

## 2022-03-21 MED ORDER — ONDANSETRON HCL 4 MG/2ML IJ SOLN
4.0000 mg | Freq: Four times a day (QID) | INTRAMUSCULAR | Status: DC | PRN
  Filled 2022-03-21: qty 2

## 2022-03-21 MED ORDER — TIOTROPIUM BROMIDE MONOHYDRATE 18 MCG IN CAPS
18.0000 ug | ORAL_CAPSULE | Freq: Every day | RESPIRATORY_TRACT | Status: DC
  Administered 2022-03-21 – 2022-03-23 (×3): 18 ug via RESPIRATORY_TRACT
  Filled 2022-03-21: qty 5

## 2022-03-21 MED ORDER — FUROSEMIDE 10 MG/ML IJ SOLN
40.0000 mg | Freq: Every day | INTRAMUSCULAR | Status: DC
  Administered 2022-03-22: 40 mg via INTRAVENOUS
  Filled 2022-03-21 (×2): qty 4

## 2022-03-21 MED ORDER — LEVALBUTEROL HCL 0.63 MG/3ML IN NEBU
0.6300 mg | INHALATION_SOLUTION | Freq: Four times a day (QID) | RESPIRATORY_TRACT | Status: DC | PRN
  Administered 2022-03-21: 0.63 mg via RESPIRATORY_TRACT
  Filled 2022-03-21: qty 3

## 2022-03-21 MED ORDER — ASPIRIN 81 MG PO TBEC
81.0000 mg | DELAYED_RELEASE_TABLET | Freq: Every day | ORAL | Status: DC
  Administered 2022-03-22 – 2022-03-23 (×2): 81 mg via ORAL
  Filled 2022-03-21 (×2): qty 1

## 2022-03-21 MED ORDER — ASPIRIN 81 MG PO CHEW: 324.0000 mg | CHEWABLE_TABLET | ORAL | Status: AC

## 2022-03-21 MED ORDER — FUROSEMIDE 10 MG/ML IJ SOLN
40.0000 mg | Freq: Two times a day (BID) | INTRAMUSCULAR | Status: DC
  Administered 2022-03-21 (×2): 40 mg via INTRAVENOUS
  Filled 2022-03-21 (×2): qty 4

## 2022-03-21 MED ORDER — ADULT MULTIVITAMIN W/MINERALS CH
1.0000 | ORAL_TABLET | Freq: Every day | ORAL | Status: DC
  Administered 2022-03-21 – 2022-03-23 (×3): 1 via ORAL
  Filled 2022-03-21 (×3): qty 1

## 2022-03-21 MED ORDER — FOLIC ACID 1 MG PO TABS
1.0000 mg | ORAL_TABLET | Freq: Every day | ORAL | Status: DC
  Administered 2022-03-21 – 2022-03-23 (×3): 1 mg via ORAL
  Filled 2022-03-21 (×3): qty 1

## 2022-03-21 MED ORDER — ALBUTEROL SULFATE (2.5 MG/3ML) 0.083% IN NEBU: 2.5000 mg | INHALATION_SOLUTION | Freq: Once | RESPIRATORY_TRACT | Status: AC

## 2022-03-21 MED ORDER — ACETAMINOPHEN 325 MG PO TABS
650.0000 mg | ORAL_TABLET | ORAL | Status: DC | PRN
  Administered 2022-03-21: 650 mg via ORAL
  Filled 2022-03-21: qty 2

## 2022-03-21 MED ORDER — MORPHINE SULFATE (PF) 2 MG/ML IV SOLN
2.0000 mg | INTRAVENOUS | Status: DC | PRN
  Administered 2022-03-21: 2 mg via INTRAVENOUS
  Filled 2022-03-21: qty 1

## 2022-03-21 MED ORDER — METOPROLOL TARTRATE 25 MG PO TABS
25.0000 mg | ORAL_TABLET | Freq: Two times a day (BID) | ORAL | Status: DC
  Administered 2022-03-21 – 2022-03-23 (×6): 25 mg via ORAL
  Filled 2022-03-21 (×6): qty 1

## 2022-03-21 MED ORDER — CICLESONIDE 160 MCG/ACT IN AERS: 2.0000 | INHALATION_SPRAY | Freq: Two times a day (BID) | RESPIRATORY_TRACT | Status: DC

## 2022-03-21 MED ORDER — SODIUM CHLORIDE 0.9 % IV SOLN: INTRAVENOUS | Status: DC

## 2022-03-21 MED ORDER — INSULIN ASPART 100 UNIT/ML IJ SOLN
0.0000 [IU] | Freq: Every day | INTRAMUSCULAR | Status: DC
  Administered 2022-03-22: 2 [IU] via SUBCUTANEOUS
  Filled 2022-03-21 (×2): qty 1

## 2022-03-21 MED ORDER — DIAZEPAM 5 MG PO TABS
10.0000 mg | ORAL_TABLET | Freq: Three times a day (TID) | ORAL | Status: DC
  Administered 2022-03-21 – 2022-03-23 (×6): 10 mg via ORAL
  Filled 2022-03-21 (×6): qty 2

## 2022-03-21 MED ORDER — OXYCODONE HCL 5 MG PO TABS
10.0000 mg | ORAL_TABLET | ORAL | Status: DC | PRN
  Administered 2022-03-21 – 2022-03-23 (×3): 10 mg via ORAL
  Filled 2022-03-21 (×3): qty 2

## 2022-03-21 MED ORDER — ASPIRIN 300 MG RE SUPP: 300.0000 mg | RECTAL | Status: AC

## 2022-03-21 MED ORDER — MOMETASONE FURO-FORMOTEROL FUM 200-5 MCG/ACT IN AERO
2.0000 | INHALATION_SPRAY | Freq: Two times a day (BID) | RESPIRATORY_TRACT | Status: DC
  Administered 2022-03-22 – 2022-03-23 (×2): 2 via RESPIRATORY_TRACT
  Filled 2022-03-21: qty 8.8

## 2022-03-21 MED ORDER — NICOTINE POLACRILEX 2 MG MT GUM
2.0000 mg | CHEWING_GUM | OROMUCOSAL | Status: DC | PRN
  Administered 2022-03-22: 2 mg via ORAL
  Filled 2022-03-21 (×2): qty 1

## 2022-03-21 MED ORDER — INSULIN ASPART 100 UNIT/ML IJ SOLN
0.0000 [IU] | Freq: Three times a day (TID) | INTRAMUSCULAR | Status: DC
  Administered 2022-03-21 – 2022-03-22 (×2): 2 [IU] via SUBCUTANEOUS
  Administered 2022-03-23: 1 [IU] via SUBCUTANEOUS
  Filled 2022-03-21 (×3): qty 1

## 2022-03-21 MED ORDER — NICOTINE 21 MG/24HR TD PT24
21.0000 mg | MEDICATED_PATCH | Freq: Every day | TRANSDERMAL | Status: DC
  Administered 2022-03-21 – 2022-03-23 (×3): 21 mg via TRANSDERMAL
  Filled 2022-03-21 (×3): qty 1

## 2022-03-21 MED ORDER — LORAZEPAM 1 MG PO TABS: 1.0000 mg | ORAL_TABLET | ORAL | Status: DC | PRN

## 2022-03-21 MED ORDER — ASPIRIN 81 MG PO CHEW: 81.0000 mg | CHEWABLE_TABLET | Freq: Every day | ORAL | Status: DC

## 2022-03-21 MED ORDER — GABAPENTIN 300 MG PO CAPS
300.0000 mg | ORAL_CAPSULE | Freq: Three times a day (TID) | ORAL | Status: DC
  Administered 2022-03-21 – 2022-03-23 (×7): 300 mg via ORAL
  Filled 2022-03-21 (×7): qty 1

## 2022-03-21 MED ORDER — ALBUTEROL SULFATE (2.5 MG/3ML) 0.083% IN NEBU
INHALATION_SOLUTION | RESPIRATORY_TRACT | Status: AC
  Administered 2022-03-21: 2.5 mg via RESPIRATORY_TRACT
  Filled 2022-03-21: qty 3

## 2022-03-21 MED ORDER — THIAMINE MONONITRATE 100 MG PO TABS
100.0000 mg | ORAL_TABLET | Freq: Every day | ORAL | Status: DC
  Administered 2022-03-21 – 2022-03-23 (×3): 100 mg via ORAL
  Filled 2022-03-21 (×3): qty 1

## 2022-03-21 MED ORDER — METHADONE HCL 10 MG PO TABS
5.0000 mg | ORAL_TABLET | Freq: Every day | ORAL | Status: DC
  Administered 2022-03-21 – 2022-03-22 (×2): 5 mg via ORAL
  Filled 2022-03-21 (×2): qty 1

## 2022-03-21 MED ORDER — MAGNESIUM HYDROXIDE 400 MG/5ML PO SUSP: 30.0000 mL | Freq: Every day | ORAL | Status: DC | PRN

## 2022-03-21 MED ORDER — FENOFIBRATE 160 MG PO TABS: 160.0000 mg | ORAL_TABLET | Freq: Every day | ORAL | Status: DC

## 2022-03-21 MED ORDER — EZETIMIBE 10 MG PO TABS
10.0000 mg | ORAL_TABLET | Freq: Every day | ORAL | Status: DC
  Administered 2022-03-21 – 2022-03-23 (×3): 10 mg via ORAL
  Filled 2022-03-21 (×3): qty 1

## 2022-03-21 MED ORDER — BUPROPION HCL ER (XL) 150 MG PO TB24: 150.0000 mg | ORAL_TABLET | Freq: Every day | ORAL | Status: DC

## 2022-03-21 MED ORDER — HEPARIN SODIUM (PORCINE) 5000 UNIT/ML IJ SOLN
2300.0000 [IU] | Freq: Once | INTRAMUSCULAR | Status: AC
  Administered 2022-03-21: 2300 [IU] via INTRAVENOUS

## 2022-03-21 MED ORDER — ALPRAZOLAM 0.25 MG PO TABS: 0.2500 mg | ORAL_TABLET | Freq: Two times a day (BID) | ORAL | Status: DC | PRN

## 2022-03-21 MED ORDER — LORAZEPAM 2 MG/ML IJ SOLN: 1.0000 mg | INTRAMUSCULAR | Status: DC | PRN

## 2022-03-21 NOTE — Progress Notes (Signed)
ANTICOAGULATION CONSULT NOTE  Pharmacy Consult for heparin infusion Indication: Unstable angina / A fib.  Allergies  Allergen Reactions   Niacin Other (See Comments)   Penicillin G     Patient Measurements: Height: 5\' 10"  (177.8 cm) Weight: 77.1 kg (170 lb) IBW/kg (Calculated) : 73 Heparin Dosing Weight: 77.1 kg  Vital Signs: Temp: 98.2 F (36.8 C) (12/02 1951) Temp Source: Oral (12/02 1951) BP: 96/57 (12/02 1951) Pulse Rate: 89 (12/02 1951)  Labs: Recent Labs    03/20/22 2316 03/21/22 0442 03/21/22 0809 03/21/22 1035 03/21/22 1334 03/21/22 1427 03/21/22 1613 03/21/22 2146  HGB 12.6* 13.3  --   --   --   --   --   --   HCT 39.0 42.0  --   --   --   --   --   --   PLT 313 340  --   --   --   --   --   --   APTT 36  --   --   --   --   --   --   --   LABPROT 15.7*  --   --   --   --   --   --   --   INR 1.3*  --   --   --   --   --   --   --   HEPARINUNFRC  --  0.54  --   --  <0.10*  --   --  0.34  CREATININE 0.86 0.90  --   --   --   --   --   --   TROPONINIHS 196* 720*   < > 707*  --  518* 573*  --    < > = values in this interval not displayed.     Estimated Creatinine Clearance: 82.2 mL/min (by C-G formula based on SCr of 0.9 mg/dL).   Medical History: History reviewed. No pertinent past medical history.  Assessment: Pt is a 66 yo male presenting to ED diagnosed w/ NSTEMI and new onset A fib.    Goal of Therapy:  Heparin level 0.3-0.7 units/ml aPTT 66-102 seconds Monitor platelets by anticoagulation protocol: Yes   12/02 0442 HL 0.54, therapeutic x 1 12/2 1334  HL < 0.10   subtherapeutic 12/02 2146 HL 0.34, therapeutic x 1   Plan:  Continue heparin infusion at 1350 units/hr Recheck HL w/ AM labs to confirm CBC daily while on heparin  2147, PharmD, [facility] 03/21/2022 10:34 PM

## 2022-03-21 NOTE — Progress Notes (Signed)
[facility]    SUBJECTIVE: Patient complains of some chest pain symptoms shortness of breath but mostly has right lower leg pain and discomfort which is relatively chronic.  Patient currently receiving echocardiogram at bedside   Vitals:   03/21/22 0945 03/21/22 1000 03/21/22 1030 03/21/22 1037  BP:  (!) 128/108  (!) 131/97  Pulse: (!) 50 (!) 131  (!) 117  Resp:   17 15  Temp:    (!) 97.5 F (36.4 C)  TempSrc:    Oral  SpO2: 95% 93%  95%  Weight:      Height:         Intake/Output Summary (Last 24 hours) at 03/21/2022 1217 Last data filed at 03/21/2022 0700 Gross per 24 hour  Intake --  Output 850 ml  Net -850 ml      PHYSICAL EXAM  General: Well developed, well nourished, in no acute distress HEENT:  Normocephalic and atramatic Neck:  No JVD.  Lungs: Clear bilaterally to auscultation and percussion. Heart: Irregularly irregular. Normal S1 and S2 without gallops or 3/6 sem murmurs.  Abdomen: Bowel sounds are positive, abdomen soft and non-tender  Msk:  Back normal, normal gait. Normal strength and tone for age. Extremities: No clubbing, cyanosis or edema.  Reduced pulses amputation fourth and fifth toe right foot Neuro: Alert and oriented X 3. Psych:  Good affect, responds appropriately   LABS: Basic Metabolic Panel: Recent Labs    03/20/22 2316 03/21/22 0442  NA 136 136  K 3.5 4.2  CL 105 105  CO2 23 25  GLUCOSE 113* 135*  BUN 11 11  CREATININE 0.86 0.90  CALCIUM 8.7* 9.0  MG 2.0  --    Liver Function Tests: Recent Labs    03/20/22 2316  AST 22  ALT 16  ALKPHOS 51  BILITOT 1.0  PROT 6.9  ALBUMIN 3.7   No results for input(s): "LIPASE", "AMYLASE" in the last 72 hours. CBC: Recent Labs    03/20/22 2316 03/21/22 0442  WBC 7.5 10.4  NEUTROABS 4.2  --   HGB 12.6* 13.3  HCT 39.0 42.0  MCV 87.8 88.8  PLT 313 340   Cardiac Enzymes: No results for input(s): "CKTOTAL", "CKMB", "CKMBINDEX", "TROPONINI" in the last 72 hours. BNP: Invalid  input(s): "POCBNP" D-Dimer: No results for input(s): "DDIMER" in the last 72 hours. Hemoglobin A1C: No results for input(s): "HGBA1C" in the last 72 hours. Fasting Lipid Panel: Recent Labs    03/21/22 0442  CHOL 151  HDL 39*  LDLCALC 96  TRIG 82  CHOLHDL 3.9   Thyroid Function Tests: No results for input(s): "TSH", "T4TOTAL", "T3FREE", "THYROIDAB" in the last 72 hours.  Invalid input(s): "FREET3" Anemia Panel: No results for input(s): "VITAMINB12", "FOLATE", "FERRITIN", "TIBC", "IRON", "RETICCTPCT" in the last 72 hours.  DG Chest Portable 1 View  Result Date: 03/21/2022 CLINICAL DATA:  Chest pain. EXAM: PORTABLE CHEST 1 VIEW COMPARISON:  Portable chest 06/01/2018 FINDINGS: Partially lucent electrical pads overlie the field over the lower left chest. There is overlying monitor wiring. There is mild-to-moderate cardiomegaly with increased projected cardiac size since 2020. There is increased central vascular prominence, and development of mild interstitial edema in the mid to lower lung fields and bilateral trace pleural effusions. There are faint hazy infrahilar opacities in both lungs which are probably due to ground-glass edema, less likely pneumonitis. Remaining lungs are clear of focal opacity with COPD change. Stable mediastinum with calcification in the aortic arch. There is osteopenia with slight thoracic dextroscoliosis.  Thoracic spondylosis. IMPRESSION: 1. Cardiomegaly with mild interstitial edema and trace pleural effusions consistent with CHF or fluid overload. 2. Faint hazy infrahilar opacities which are probably due to ground-glass edema, less likely pneumonitis. 3. COPD. 4. Aortic atherosclerosis. Electronically Signed   By: Almira Bar M.D.   On: 03/21/2022 00:38     Echo pending  TELEMETRY: Rate between 100 110 atrial fibrillation nonspecific ST-T changes:  ASSESSMENT AND PLAN:  Principal Problem:   NSTEMI (non-ST elevated myocardial infarction) (HCC) Active  Problems:   Type 2 diabetes mellitus with peripheral neuropathy (HCC)   Dyslipidemia   Depression   Chronic obstructive pulmonary disease (COPD) (HCC)   Acute CHF (congestive heart failure) (HCC)   New onset atrial fibrillation (HCC)    Plan Patient resting comfortably still having low-level pain in chest but worse in his lower leg has asked for pain medications to help with persistent peripheral vascular pain Atrial fibrillation better rate control with metoprolol continue anticoagulation short-term with her primary consider switching to Eliquis echocardiogram for assessment of valvular structure History of bicuspid aortic valve recommend echocardiogram for further assessment will consider TEE COPD shortness of breath dyspnea inhalers as necessary for dyspnea Evidence of mild congestive heart failure will consider diuretic therapy to help with fluid Diabetes will continue to follow with short acting insulin therapy and management Enzymes suggestive of possible non-STEMI with elevated troponins making determination about invasive evaluation including possible cardiac cath early next week Consider vascular surgery consult for peripheral vascular disease pain and circulation   Alwyn Pea, MD 03/21/2022 12:17 PM

## 2022-03-21 NOTE — H&P (Addendum)
[place]   PATIENT NAME: Jason Beck    MR#:  [id number]  DATE OF BIRTH:  08-26-1955  DATE OF ADMISSION:  03/20/2022  PRIMARY CARE PHYSICIAN: Rudean Curt, MD   Patient is coming from: Home  REQUESTING/REFERRING PHYSICIAN: Loleta Rose, MD  CHIEF COMPLAINT:   Chief Complaint  Patient presents with   Chest Pain    Patient C/O chest pain and SOB that woke him from his sleep this evening    HISTORY OF PRESENT ILLNESS:  Jason Beck is a 66 y.o., male with medical history significant for peripheral vascular disease, COPD, type 2 diabetes mellitus with peripheral neuropathy and tobacco abuse and aortic stenosis with regurgitation, presented to emergency room with acute onset of midsternal chest pain felt as sharp pain in graded 6/10 in severity with associated diaphoresis, dyspnea and palpitations.  He denied any associated nausea or vomiting.  No cough or wheezing or hemoptysis.  No leg pain or edema or recent travels or surgeries.  No fever or chills.  He was noted to be in atrial fibrillation with slightly rapid ventricular response for which she was given 10 mg of IV Cardizem with better controlled rate.  Initially was thought to be code STEMI and Dr. Vennie Homans was called however after review of his EKG and given elevated troponin I is considered non-STEMI.  ED Course: When he came to the ER CMP revealed calcium of 8.7 and high sensitive troponin I 196 with BNP of 695.9 and HDL of 36.  CBC was within normal.  Urine drug screen was positive for methadone and cannabinoids. EKG as reviewed by me : Saying EKG showed atrial fibrillation with rapid ventricular sponsor 110 with ST segment depression laterally and PVCs with right axis deviation. Imaging: Portable chest x-ray showed cardiomegaly with mild interstitial edema and trace pleural effusion consistent with CHF or fluid overload, faint hazy infrahilar opacities that are probably due to groundglass edema and  less likely pneumonitis, COPD and aortic atherosclerosis.  The patient was given 10 mg of IV Cardizem and was started on IV heparin with bolus and drip.  He will be admitted to a PCU bed for further evaluation and management. 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  SOCIAL HISTORY:   Social History   Tobacco Use   Smoking status: Every Day    Packs/day: 1.50    Types: Cigarettes   Smokeless tobacco: Never  Substance Use Topics   Alcohol use: Yes    Alcohol/week: 2.0 - 3.0 standard drinks of alcohol    Types: 2 - 3 Cans of beer per week    Comment: daily    FAMILY HISTORY:  No family history on file.  DRUG ALLERGIES:   Allergies  Allergen Reactions   Niacin Other (See Comments)   Penicillin G     REVIEW OF SYSTEMS:   ROS As per history of present illness. All pertinent systems were reviewed above. Constitutional, HEENT, cardiovascular, respiratory, GI, GU, musculoskeletal, neuro, psychiatric, endocrine, integumentary and hematologic systems were reviewed and are otherwise negative/unremarkable except for positive findings mentioned above in the HPI.   MEDICATIONS AT HOME:   Prior to Admission medications   Medication Sig Start Date End Date Taking? Authorizing Provider  aspirin 81 MG chewable tablet Chew by mouth. 11/16/17   [provider]  atorvastatin (LIPITOR) 80 MG tablet Take by mouth. 10/24/19   [provider]  buPROPion (WELLBUTRIN XL) 150 MG 24 hr tablet Take by mouth. 09/17/20   [provider]  clopidogrel (PLAVIX) 75 MG tablet Take 75 mg by mouth daily.    [provider]  ezetimibe (ZETIA) 10 MG tablet Take 1 tablet by mouth daily. 10/05/20   [provider]  fenofibrate (TRICOR) 145 MG tablet Take 145 mg by mouth  daily.    [provider]  gabapentin (NEURONTIN) 300 MG capsule Take 300 mg by mouth 3 (three) times daily.    [provider]  gabapentin (NEURONTIN) 300 MG capsule Take by mouth. 03/26/20   [provider]  lisinopril (ZESTRIL) 20 MG tablet Take by mouth. 10/27/19   [provider]  metFORMIN (GLUCOPHAGE) 500 MG tablet Take by mouth. 09/17/20   [provider]  methadone (DOLOPHINE) 10 MG tablet Take 10 mg by mouth every 12 (twelve) hours.    [provider]  oxycodone (OXY-IR) 5 MG capsule Take 5 mg by mouth every 4 (four) hours as needed.    [provider]  predniSONE (DELTASONE) 10 MG tablet Take 1 tablet (10 mg total) by mouth daily. Day 1-3: take 4 tablets PO daily Day 4-6: take 3 tablets PO daily Day 7-9: take 2 tablets PO daily Day 10-12: take 1 tablet PO daily 06/01/18   Minna Antis, MD      VITAL SIGNS:  Blood pressure 127/66, pulse 91, temperature 98.2 F (36.8 C), temperature source Oral, resp. rate 15, height 5\' 10"  (1.778 m), weight 77.1 kg, SpO2 96 %.  PHYSICAL EXAMINATION:  Physical Exam  GENERAL:  66 y.o.-year-old Caucasian male patient lying in the bed with mild respiratory distress with conversational dyspnea. EYES: Pupils equal, round, reactive to light and accommodation. No scleral icterus. Extraocular muscles intact.  HEENT: Head atraumatic, normocephalic. Oropharynx and nasopharynx clear.  NECK:  Supple, no jugular venous distention. No thyroid enlargement, no tenderness.  LUNGS: Slight diminished bibasilar breath sounds with bibasilar rales.. No use of accessory muscles of respiration.  CARDIOVASCULAR: Irregularly irregular rhythm, S1, S2 normal. No murmurs, rubs, or gallops.  ABDOMEN: Soft, nondistended, nontender. Bowel sounds present. No organomegaly or mass.  EXTREMITIES: No pedal edema, cyanosis, or clubbing.  NEUROLOGIC: Cranial nerves II through XII are intact. Muscle strength 5/5 in all  extremities. Sensation intact. Gait not checked. Musculoskeletal: He is status post amputation of the right second and fifth toes. PSYCHIATRIC: The patient is alert and oriented x 3.  Normal affect and good eye contact. SKIN: No obvious rash, lesion, or ulcer.   LABORATORY PANEL:   CBC Recent Labs  Lab 03/20/22 2316  WBC 7.5  HGB 12.6*  HCT 39.0  PLT 313   ------------------------------------------------------------------------------------------------------------------  Chemistries  Recent Labs  Lab 03/20/22 2316  NA 136  K 3.5  CL 105  CO2 23  GLUCOSE 113*  BUN 11  CREATININE 0.86  CALCIUM 8.7*  MG 2.0  AST 22  ALT 16  ALKPHOS 51  BILITOT 1.0   ------------------------------------------------------------------------------------------------------------------  Cardiac Enzymes No results for input(s): "TROPONINI" in the last 168 hours. ------------------------------------------------------------------------------------------------------------------  RADIOLOGY:  DG Chest Portable 1 View  Result Date: 03/21/2022 CLINICAL DATA:  Chest pain. EXAM: PORTABLE CHEST 1 VIEW COMPARISON:  Portable chest 06/01/2018 FINDINGS: Partially lucent electrical pads overlie the field over the lower left chest. There is overlying monitor wiring. There is mild-to-moderate cardiomegaly with increased projected cardiac size  since 2020. There is increased central vascular prominence, and development of mild interstitial edema in the mid to lower lung fields and bilateral trace pleural effusions. There are faint hazy infrahilar opacities in both lungs which are probably due to ground-glass edema, less likely pneumonitis. Remaining lungs are clear of focal opacity with COPD change. Stable mediastinum with calcification in the aortic arch. There is osteopenia with slight thoracic dextroscoliosis. Thoracic spondylosis. IMPRESSION: 1. Cardiomegaly with mild interstitial edema and trace pleural effusions  consistent with CHF or fluid overload. 2. Faint hazy infrahilar opacities which are probably due to ground-glass edema, less likely pneumonitis. 3. COPD. 4. Aortic atherosclerosis. Electronically Signed   By: Almira Bar M.D.   On: 03/21/2022 00:38      IMPRESSION AND PLAN:  Assessment and Plan: * NSTEMI (non-ST elevated myocardial infarction) [facility]) - The patient will be admitted to a progressive unit bed. - We will continue IV heparin drip. - We will place him on high-dose statin with Lipitor and check fasting lipids. - He will be placed on beta-blocker therapy. - Aspirin and Plavix will be continued. - Echo will be obtained. - He was seen in the ER by Dr. Juliann Pares who is aware about the patient and will follow with [place].  Acute CHF (congestive heart failure) (HCC) - I suspect diastolic etiology. - Will be diuresed with IV Lasix. - 2D echo and cardiology consult will be obtained as mentioned above.  New onset atrial fibrillation (HCC) - Initially the patient had rapid ventricular sponsor that responded to IV Cardizem bolus. - Cardiology consult and 2D echo will be obtained.  Dyslipidemia - We will can continue Lipitor, Tricor and Zetia.  Type 2 diabetes mellitus with peripheral neuropathy (HCC) - The patient will be placed on supplement coverage with NovoLog. - We will continue Neurontin. - We will hold off metformin.  Chronic obstructive pulmonary disease (COPD) (HCC) - We will continue his as needed albuterol.  Depression - We will continue Wellbutrin XL.    DVT prophylaxis: IV heparin. Advanced Care Planning:  Code Status: full code. Family Communication:  The plan of care was discussed in details with the patient (and family). I answered all questions. The patient agreed to proceed with the above mentioned plan. Further management will depend upon hospital course. Disposition Plan: Back to previous home environment Consults called: Cardiology All the records are  reviewed and case discussed with ED provider.  Status is: Inpatient   At the time of the admission, it appears that the appropriate admission status for this patient is inpatient.  This is judged to be reasonable and necessary in order to provide the required intensity of service to ensure the patient's safety given the presenting symptoms, physical exam findings and initial radiographic and laboratory data in the context of comorbid conditions.  The patient requires inpatient status due to high intensity of service, high risk of further deterioration and high frequency of surveillance required.  I certify that at the time of admission, it is my clinical judgment that the patient will require inpatient hospital care extending more than 2 midnights.                            Dispo: The patient is from: Home              Anticipated d/c is to: Home              Patient currently is not medically  stable to d/c.              Difficult to place patient: No  Hannah BeatJan A Lautaro Koral M.D on 03/21/2022 at 4:31 AM  [facility]   From 7 PM-7 AM, contact night-coverage [email or web address]  CC: Primary care physician; Rudean Curtamanujam, Vijayaprabha, MD

## 2022-03-21 NOTE — Progress Notes (Signed)
ANTICOAGULATION CONSULT NOTE  Pharmacy Consult for heparin infusion Indication: Unstable angina / A fib.  Allergies  Allergen Reactions   Niacin Other (See Comments)   Penicillin G     Patient Measurements: Height: 5\' 10"  (177.8 cm) Weight: 77.1 kg (170 lb) IBW/kg (Calculated) : 73 Heparin Dosing Weight: 77.1 kg  Vital Signs: Temp: 97.9 F (36.6 C) (12/02 1431) Temp Source: Oral (12/02 1431) BP: 112/84 (12/02 1431) Pulse Rate: 112 (12/02 1431)  Labs: Recent Labs    03/20/22 2316 03/21/22 0442 03/21/22 0809 03/21/22 1035 03/21/22 1334  HGB 12.6* 13.3  --   --   --   HCT 39.0 42.0  --   --   --   PLT 313 340  --   --   --   APTT 36  --   --   --   --   LABPROT 15.7*  --   --   --   --   INR 1.3*  --   --   --   --   HEPARINUNFRC  --  0.54  --   --  <0.10*  CREATININE 0.86 0.90  --   --   --   TROPONINIHS 196* 720* 824* 707*  --      Estimated Creatinine Clearance: 82.2 mL/min (by C-G formula based on SCr of 0.9 mg/dL).   Medical History: No past medical history on file.  Assessment: Pt is a 66 yo male presenting to ED diagnosed w/ NSTEMI and new onset A fib.    Goal of Therapy:  Heparin level 0.3-0.7 units/ml aPTT 66-102 seconds Monitor platelets by anticoagulation protocol: Yes   12/02 0442 HL 0.54, therapeutic x 1 12/2 1334  HL < 0.10   subtherapeutic   Plan:  12/2 1334  HL < 0.10   subtherapeutic Bolus of 2300 units x 1 and  Increase heparin infusion to 1350 units/hr Check HL in 6 hr to confirm CBC daily while on heparin  14/2 PharmD Clinical Pharmacist 03/21/2022

## 2022-03-21 NOTE — Care Management (Addendum)
Notified by bedside RN.  Patient diaphoretic, tachycardic.  Per patient he drinks 6 pack beer daily.  High suspicion for early withdrawals.  Will initiate CIWA protocol with as needed Ativan.  Scheduled Valium 10 TID. Continue telemetry monitoring.  Sublingual nitro as needed chest pain.  Low threshold for transfer to higher level of care.  Lolita Patella MD

## 2022-03-21 NOTE — Progress Notes (Signed)
ANTICOAGULATION CONSULT NOTE  Pharmacy Consult for heparin infusion Indication: Unstable angina / A fib.  Allergies  Allergen Reactions   Niacin Other (See Comments)   Penicillin G     Patient Measurements: Height: 5\' 10"  (177.8 cm) Weight: 77.1 kg (170 lb) IBW/kg (Calculated) : 73 Heparin Dosing Weight: 77.1 kg  Vital Signs: Temp: 97.9 F (36.6 C) (12/01 2313) Temp Source: Oral (12/01 2313) BP: 126/53 (12/01 2330) Pulse Rate: 57 (12/01 2330)  Labs: Recent Labs    03/20/22 2316  HGB 12.6*  HCT 39.0  PLT 313  APTT 36  LABPROT 15.7*  INR 1.3*  CREATININE 0.86  TROPONINIHS 196*    Estimated Creatinine Clearance: 86.1 mL/min (by C-G formula based on SCr of 0.86 mg/dL).   Medical History: No past medical history on file.  Assessment: Pt is a 66 yo male presenting to ED diagnosed w/ NSTEMI and new onset A fib.  Goal of Therapy:  Heparin level 0.3-0.7 units/ml aPTT 66-102 seconds Monitor platelets by anticoagulation protocol: Yes   Plan:  Bolus 4000 units x 1 prior to consult Start heparin infusion at 1150 units/hr Check HL in 6 hr after start of infusion CBC daily while on heparin  79, PharmD, [facility] 03/21/2022 12:08 AM

## 2022-03-21 NOTE — Progress Notes (Signed)
Brief hospitalist update note.  This is a nonbillable note.  Please see same-day H&P for full billable details.  Briefly, this is a 66 year old male history of hypertension, tobacco use, diabetes, COPD, bicuspid aortic valve who presents to the ED with acute onset of midsternal chest pain.  EKG initially concerning for STEMI.  Code STEMI called and interventional cardiology contacted however after review of EKG code STEMI canceled and NSTEMI treatment initiated.  On my arrival patient is overall stable.  He is in no distress.  He remains in rapid atrial fibrillation.  I believe this is new onset.  He is on heparin gtt.  Cardiology consulted on admission.  Will continue with heparin GTT, aspirin, statin, beta-blocker.  Heparin gtt. will run for 48 hours.  Will maintain telemetry monitoring and adjust rate control strategy as needed.  Lolita Patella MD  No charge

## 2022-03-21 NOTE — Plan of Care (Signed)
  Problem: Education: Goal: Understanding of cardiac disease, CV risk reduction, and recovery process will improve Outcome: Progressing   Problem: Activity: Goal: Ability to tolerate increased activity will improve Outcome: Progressing   Problem: Cardiac: Goal: Ability to achieve and maintain adequate cardiovascular perfusion will improve Outcome: Progressing   Problem: Coping: Goal: Ability to adjust to condition or change in health will improve Outcome: Not Progressing  Diabetic diet along with hypo/hyperglycemia s/s discussed with patient.

## 2022-03-21 NOTE — ED Notes (Signed)
Pt heparin labs sent. Spiriva inhaler is due messaged pharmacy about importance of inhaler.

## 2022-03-21 NOTE — ED Notes (Signed)
Lab messaged, heparin needs recollect because is hemolyzed.

## 2022-03-21 NOTE — Assessment & Plan Note (Signed)
-   We will continue Wellbutrin XL. 

## 2022-03-21 NOTE — Assessment & Plan Note (Signed)
-   Initially the patient had rapid ventricular sponsor that responded to IV Cardizem bolus. - Cardiology consult and 2D echo will be obtained.

## 2022-03-21 NOTE — Assessment & Plan Note (Signed)
-   We will can continue Lipitor, Tricor and Zetia.

## 2022-03-21 NOTE — Assessment & Plan Note (Addendum)
-   The patient will be admitted to a progressive unit bed. - We will continue IV heparin drip. - We will place him on high-dose statin with Lipitor and check fasting lipids. - He will be placed on beta-blocker therapy. - Aspirin and Plavix will be continued. - Echo will be obtained. - He was seen in the ER by Dr. Juliann Pares who is aware about the patient and will follow with [place].

## 2022-03-21 NOTE — Progress Notes (Signed)
ANTICOAGULATION CONSULT NOTE  Pharmacy Consult for heparin infusion Indication: Unstable angina / A fib.  Allergies  Allergen Reactions   Niacin Other (See Comments)   Penicillin G     Patient Measurements: Height: 5\' 10"  (177.8 cm) Weight: 77.1 kg (170 lb) IBW/kg (Calculated) : 73 Heparin Dosing Weight: 77.1 kg  Vital Signs: Temp: 98.2 F (36.8 C) (12/02 0330) Temp Source: Oral (12/02 0330) BP: 110/81 (12/02 0500) Pulse Rate: 33 (12/02 0500)  Labs: Recent Labs    03/20/22 2316 03/21/22 0442  HGB 12.6* 13.3  HCT 39.0 42.0  PLT 313 340  APTT 36  --   LABPROT 15.7*  --   INR 1.3*  --   HEPARINUNFRC  --  0.54  CREATININE 0.86 0.90  TROPONINIHS 196* 720*     Estimated Creatinine Clearance: 82.2 mL/min (by C-G formula based on SCr of 0.9 mg/dL).   Medical History: No past medical history on file.  Assessment: Pt is a 66 yo male presenting to ED diagnosed w/ NSTEMI and new onset A fib.  Goal of Therapy:  Heparin level 0.3-0.7 units/ml aPTT 66-102 seconds Monitor platelets by anticoagulation protocol: Yes   12/02 0442 HL 0.54, therapeutic x 1  Plan:  Continue heparin infusion at 1150 units/hr Check HL in 6 hr to confirm CBC daily while on heparin  14/02, PharmD, [facility] 03/21/2022 6:57 AM

## 2022-03-21 NOTE — ED Notes (Signed)
Critical Troponin 196 reported to York Cerise MD

## 2022-03-21 NOTE — Progress Notes (Signed)
Echocardiogram 2D Echocardiogram has been performed.  Jason Beck 03/21/2022, 12:28 PM

## 2022-03-21 NOTE — Assessment & Plan Note (Signed)
-   I suspect diastolic etiology. - Will be diuresed with IV Lasix. - 2D echo and cardiology consult will be obtained as mentioned above.

## 2022-03-21 NOTE — ED Notes (Addendum)
Pt A&Ox4. Pt able to ambulate to the bathroom and back with a steady gait. Pt CBG 151 and received 2 units of insulin. Pt troponin 0730 labs drawn and sent down. Pt currently sitting up in bed with breakfast.

## 2022-03-21 NOTE — Assessment & Plan Note (Addendum)
-   The patient will be placed on supplement coverage with NovoLog. - We will continue Neurontin. - We will hold off metformin.

## 2022-03-21 NOTE — Consult Note (Signed)
CARDIOLOGY CONSULT NOTE               Patient ID: Jason Beck MRN: [id number] DOB/AGE: 11-17-1955 66 y.o.  Admit date: 03/20/2022 Referring Physician Dr. Derrill Kay ER Primary Physician Dr Maryln Manuel [facility] dorm Primary Cardiologist [facility] Reason for Consultation chest pain atrial fibrillation rapid ventricular response possible STEMI  HPI: Patient is a 65 year old male presents with acute onset of chest discomfort concerning for STEMI evaluated by EMS abnormal EKG initiated code STEMI EKG reviewed reviewed by me independently showed interventricular delay rapid atrial fibrillation rate around 110 diffuse ST segment depression laterally no significant elevation does not meet criteria for STEMI patient had sharp midsternal chest pain right lower leg pain shortness of breath dyspnea Long history of smoking COPD previous peripheral vascular disease surgery including amputations patient has chronic pain on methadone and oxycodone uses cannabis to help with pain control was seen and evaluated by me in the ER determined not to be a STEMI and was treated medically anticoagulation rate control and to have follow-up on enzymes and echocardiogram and further evaluation for possible unstable angina  Review of systems complete and found to be negative unless listed above     No past medical history on file.  No past surgical history on file.  (Not in a hospital admission)  Social History   Socioeconomic History   Marital status: Married    Spouse name: Not on file   Number of children: Not on file   Years of education: Not on file   Highest education level: Not on file  Occupational History   Not on file  Tobacco Use   Smoking status: Every Day    Packs/day: 1.50    Types: Cigarettes   Smokeless tobacco: Never  Substance and Sexual Activity   Alcohol use: Yes    Alcohol/week: 2.0 - 3.0 standard drinks of alcohol    Types: 2 - 3 Cans of beer per week    Comment:  daily   Drug use: Yes    Types: Marijuana   Sexual activity: Not on file  Other Topics Concern   Not on file  Social History Narrative   Not on file   Social Determinants of Health   Financial Resource Strain: Not on file  Food Insecurity: Not on file  Transportation Needs: Not on file  Physical Activity: Not on file  Stress: Not on file  Social Connections: Not on file  Intimate Partner Violence: Not on file    No family history on file.    Review of systems complete and found to be negative unless listed above      PHYSICAL EXAM  General: Well developed, well nourished, in no acute distress HEENT:  Normocephalic and atramatic Neck:  No JVD.  Lungs: Clear bilaterally to auscultation and percussion. Heart: HRRR . Normal S1 and S2 without gallops or murmurs.  Abdomen: Bowel sounds are positive, abdomen soft and non-tender  Msk:  Back normal, normal gait. Normal strength and tone for age. Extremities: No clubbing, cyanosis or edema.  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 Psych:  Good affect, responds appropriately  Labs:   Lab Results  Component Value Date   WBC 10.4 03/21/2022   HGB 13.3 03/21/2022   HCT 42.0 03/21/2022   MCV 88.8 03/21/2022   PLT 340 03/21/2022    Recent Labs  Lab 03/20/22 2316 03/21/22 0442  NA 136 136  K 3.5 4.2  CL 105 105  CO2 23 25  BUN 11 11  CREATININE 0.86 0.90  CALCIUM 8.7* 9.0  PROT 6.9  --   BILITOT 1.0  --   ALKPHOS 51  --   ALT 16  --   AST 22  --   GLUCOSE 113* 135*   Lab Results  Component Value Date   TROPONINI <0.03 06/01/2018    Lab Results  Component Value Date   CHOL 151 03/21/2022   CHOL 157 03/20/2022   Lab Results  Component Value Date   HDL 39 (L) 03/21/2022   HDL 36 (L) 03/20/2022   Lab Results  Component Value Date   LDLCALC 96 03/21/2022   LDLCALC 94 03/20/2022   Lab Results  Component Value Date   TRIG 82 03/21/2022   TRIG 135 03/20/2022   Lab Results   Component Value Date   CHOLHDL 3.9 03/21/2022   CHOLHDL 4.4 03/20/2022   No results found for: "LDLDIRECT"    Radiology: DG Chest Portable 1 View  Result Date: 03/21/2022 CLINICAL DATA:  Chest pain. EXAM: PORTABLE CHEST 1 VIEW COMPARISON:  Portable chest 06/01/2018 FINDINGS: Partially lucent electrical pads overlie the field over the lower left chest. There is overlying monitor wiring. There is mild-to-moderate cardiomegaly with increased projected cardiac size since 2020. There is increased central vascular prominence, and development of mild interstitial edema in the mid to lower lung fields and bilateral trace pleural effusions. There are faint hazy infrahilar opacities in both lungs which are probably due to ground-glass edema, less likely pneumonitis. Remaining lungs are clear of focal opacity with COPD change. Stable mediastinum with calcification in the aortic arch. There is osteopenia with slight thoracic dextroscoliosis. Thoracic spondylosis. IMPRESSION: 1. Cardiomegaly with mild interstitial edema and trace pleural effusions consistent with CHF or fluid overload. 2. Faint hazy infrahilar opacities which are probably due to ground-glass edema, less likely pneumonitis. 3. COPD. 4. Aortic atherosclerosis. Electronically Signed   By: Almira Bar M.D.   On: 03/21/2022 00:38    EKG: Atrial fibrillation rate of 110 rapid ventricular response interventricular conduction delay diffuse ST segment depressions laterally  ASSESSMENT AND PLAN:  Unstable angina Abnormal EKG Peripheral vascular disease Chronic pain Diabetes History of bicuspid aortic valve COPD Smoking Congestive heart failure Hyperlipidemia Atrial fibrillation RVR . Plan Agree admit to rule out myocardial infarction follow-up EKGs and troponin Patient does not meet strict criteria for STEMI so we will treat medically for now Anticoagulation for new onset rapid atrial fibrillation Echocardiogram for assessment of  ventricular function as well as evaluation of aortic valve murmur Significant history of peripheral vascular disease consider vascular consult Chronic congestive heart failure on chest x-ray consider diuretics and echocardiogram COPD maintain inhalers advised patient refrain from tobacco abuse Continue Lipitor and Tricor Zetia for lipid management Diabetes with neuropathy continue insulin coverage and follow-up blood sugars Recommend conservative cardiac involvement no invasive procedures planned at this point we will follow-up enzymes and make a determination   Signed: Alwyn Pea MD 03/21/2022, 12:18 PM

## 2022-03-21 NOTE — ED Notes (Signed)
Overdue inhaler not available yet from pharmacy. New blue top sent to lab for heparin level.

## 2022-03-21 NOTE — ED Notes (Signed)
Pt still a fib on monitor. HR varies between 115-150.

## 2022-03-21 NOTE — ED Notes (Signed)
NS infusion was not running with this RN and other day RN took over care of pt.

## 2022-03-21 NOTE — Assessment & Plan Note (Signed)
-   We will continue his as needed albuterol.

## 2022-03-22 DIAGNOSIS — I214 Non-ST elevation (NSTEMI) myocardial infarction: Secondary | ICD-10-CM | POA: Diagnosis not present

## 2022-03-22 LAB — BASIC METABOLIC PANEL
Anion gap: 9 (ref 5–15)
BUN: 15 mg/dL (ref 8–23)
CO2: 24 mmol/L (ref 22–32)
Calcium: 9.2 mg/dL (ref 8.9–10.3)
Chloride: 104 mmol/L (ref 98–111)
Creatinine, Ser: 0.97 mg/dL (ref 0.61–1.24)
GFR, Estimated: >60 mL/min (ref >60)
Glucose, Bld: 111 mg/dL — ABNORMAL HIGH (ref 70–99)
Potassium: 3.8 mmol/L (ref 3.5–5.1)
Sodium: 137 mmol/L (ref 135–145)

## 2022-03-22 LAB — CBC
HCT: 38.2 % — ABNORMAL LOW (ref 39.0–52.0)
Hemoglobin: 12.1 g/dL — ABNORMAL LOW (ref 13.0–17.0)
MCH: 27.8 pg (ref 26.0–34.0)
MCHC: 31.7 g/dL (ref 30.0–36.0)
MCV: 87.8 fL (ref 80.0–100.0)
Platelets: 293 10*3/uL (ref 150–400)
RBC: 4.35 MIL/uL (ref 4.22–5.81)
RDW: 15.4 % (ref 11.5–15.5)
WBC: 9.1 10*3/uL (ref 4.0–10.5)
nRBC: 0 % (ref 0.0–0.2)

## 2022-03-22 LAB — GLUCOSE, CAPILLARY
Glucose-Capillary: 111 mg/dL — ABNORMAL HIGH (ref 70–99)
Glucose-Capillary: 179 mg/dL — ABNORMAL HIGH (ref 70–99)
Glucose-Capillary: 227 mg/dL — ABNORMAL HIGH (ref 70–99)

## 2022-03-22 LAB — HEPARIN LEVEL (UNFRACTIONATED): Heparin Unfractionated: 0.36 IU/mL (ref 0.30–0.70)

## 2022-03-22 MED ORDER — ENOXAPARIN SODIUM 40 MG/0.4ML IJ SOSY
40.0000 mg | PREFILLED_SYRINGE | INTRAMUSCULAR | Status: DC
  Administered 2022-03-22: 40 mg via SUBCUTANEOUS
  Filled 2022-03-22: qty 0.4

## 2022-03-22 NOTE — Progress Notes (Signed)
TOC consult for SA resources, list added to AVS.   Darolyn Rua, LCSW, MSW, [place] [phone]

## 2022-03-22 NOTE — Consult Note (Signed)
ANTICOAGULATION CONSULT NOTE - Initial Consult  Pharmacy Consult for enoxaparin Indication: VTE prophylaxis  Allergies  Allergen Reactions   Niacin Other (See Comments)   Penicillin G     Patient Measurements: Height: 5\' 10"  (177.8 cm) Weight: 75.3 kg (166 lb 0.1 oz) IBW/kg (Calculated) : 73  Vital Signs: Temp: 98.1 F (36.7 C) (12/03 1956) Temp Source: Oral (12/03 1956) BP: 99/81 (12/03 1956) Pulse Rate: 63 (12/03 1956)  Labs: Recent Labs    03/20/22 2316 03/20/22 2316 03/21/22 0442 03/21/22 0809 03/21/22 1035 03/21/22 1334 03/21/22 1427 03/21/22 1613 03/21/22 2146 03/22/22 0433  HGB 12.6*  --  13.3  --   --   --   --   --   --  12.1*  HCT 39.0  --  42.0  --   --   --   --   --   --  38.2*  PLT 313  --  340  --   --   --   --   --   --  293  APTT 36  --   --   --   --   --   --   --   --   --   LABPROT 15.7*  --   --   --   --   --   --   --   --   --   INR 1.3*  --   --   --   --   --   --   --   --   --   HEPARINUNFRC  --    < > 0.54  --   --  <0.10*  --   --  0.34 0.36  CREATININE 0.86  --  0.90  --   --   --   --   --   --  0.97  TROPONINIHS 196*  --  720*   < > 707*  --  518* 573*  --   --    < > = values in this interval not displayed.    Estimated Creatinine Clearance: 76.3 mL/min (by C-G formula based on SCr of 0.97 mg/dL).   Medical History: History reviewed. No pertinent past medical history.  Medications:  Scheduled:   aspirin EC  81 mg Oral Daily   atorvastatin  80 mg Oral Daily   clopidogrel  75 mg Oral Daily   diazepam  10 mg Oral TID   ezetimibe  10 mg Oral Daily   folic acid  1 mg Oral Daily   furosemide  40 mg Intravenous Daily   gabapentin  300 mg Oral TID   insulin aspart  0-5 Units Subcutaneous QHS   insulin aspart  0-9 Units Subcutaneous TID WC   lisinopril  20 mg Oral Daily   methadone  5 mg Oral QHS   metoprolol tartrate  25 mg Oral BID   mometasone-formoterol  2 puff Inhalation BID   multivitamin with minerals  1 tablet  Oral Daily   nicotine  21 mg Transdermal Daily   thiamine  100 mg Oral Daily   Or   thiamine  100 mg Intravenous Daily   tiotropium  18 mcg Inhalation Daily   Infusions:   sodium chloride Stopped (03/21/22 0827)   PRN: acetaminophen, levalbuterol, LORazepam **OR** LORazepam, magnesium hydroxide, morphine injection, nicotine polacrilex, nitroGLYCERIN, ondansetron (ZOFRAN) IV, oxyCODONE, traZODone  Assessment: 66 year old male history of hypertension, tobacco use, diabetes, COPD, bicuspid aortic valve who presents to the ED  with acute onset of midsternal chest pain.  EKG initially concerning for STEMI. Cardiology evaluated patient and consulted for pharmacy for initiation of lovenox for VTE prophylaxis.   Goal of Therapy:  Monitor platelets by anticoagulation protocol: Yes   Plan:  Give lovenox 40mg  subq q24h Continue to monitor serum creatinine weekly and cbc every 3 days with morning labs  03/22/2022,10:00 PM

## 2022-03-22 NOTE — Progress Notes (Signed)
PROGRESS NOTE    Jason Beck  WHQ:[id number] DOB: 12/18/1955 DOA: 03/20/2022 PCP: Rudean Curt, MD    Brief Narrative:  66 year old male history of hypertension, tobacco use, diabetes, COPD, bicuspid aortic valve who presents to the ED with acute onset of midsternal chest pain.  EKG initially concerning for STEMI.  Code STEMI called and interventional cardiology contacted however after review of EKG code STEMI canceled and NSTEMI treatment initiated.   On my arrival patient is overall stable.  He is in no distress.  He remains in rapid atrial fibrillation.  I believe this is new onset.  He is on heparin gtt.  Cardiology consulted on admission.  Patient is also a daily drinker and exhibited some signs of mild withdrawal on 12/2.  CIWA protocol started with as needed Ativan and scheduled diazepam.  Appears more stable on 12/3  Assessment & Plan:   Principal Problem:   NSTEMI (non-ST elevated myocardial infarction) (HCC) Active Problems:   Acute CHF (congestive heart failure) (HCC)   New onset atrial fibrillation (HCC)   Type 2 diabetes mellitus with peripheral neuropathy (HCC)   Dyslipidemia   Depression   Chronic obstructive pulmonary disease (COPD) (HCC)  NSTEMI Cardiology consulted.  Code STEMI initially called on admission was canceled.  Patient was placed on intravenous heparin.  Troponins trended to peak.  Peak around 800. Plan: Continue heparin GTT x 48 hours total.  End time will be 12/4 at 12:15 AM.  Continue DAPT aspirin and Plavix.  Continue beta-blocker, high intensity statin.  Cardiology follow-up.  Acute systolic and diastolic congestive heart failure Echocardiogram with ejection fraction 40 to 45% and evidence of grade 1 diastolic dysfunction.  Patient presented with mild fluid overload. Has been diuresing effectively Plan: Continue Lasix 40 mg IV daily Strict ins and outs Daily weights Cardiology follow-up  New onset atrial fibrillation Continue  IV heparin as above Twice daily metoprolol for rate control Cardiac monitoring  Alcohol use disorder Tobacco use Patient states he drinks a sixpack per day.  He exhibited signs of withdrawal on 12/2.  Started on CIWA protocol. Plan: Educated on smoking cessation CIWA protocol  Hyperlipidemia PTA Lipitor, Tricor, Zetia  Type 2 diabetes mellitus with peripheral neuropathy NovoLog sliding scale for now.  Hold home metformin.  Continue home Neurontin  COPD No evidence of exacerbation Continue home bronchodilator therapy  Depression PTA Wellbutrin  DVT prophylaxis: Heparin GTT Code Status: Full Family Communication: Family member at bedside 12/3 Disposition Plan: Status is: Inpatient Remains inpatient appropriate because: NSTEMI on IV heparin.  New onset atrial fibrillation.  New onset acute congestive heart failure.  Improving.  Possible discharge in 24 hours.   Level of care: Telemetry Cardiac  Consultants:  Cardiology-[facility]  Procedures:  None  Antimicrobials: None   Subjective: Seen and examined.  No acute distress this morning.  No chest pain  Objective: Vitals:   03/21/22 2248 03/22/22 0300 03/22/22 0500 03/22/22 0845  BP: (!) 88/75 (!) 86/59  (!) 148/93  Pulse: 60 84  80  Resp: 20 18  18   Temp: 98 F (36.7 C) 98.8 F (37.1 C)  97.8 F (36.6 C)  TempSrc: Oral Oral  Oral  SpO2: 96% 95%  94%  Weight:   75.3 kg   Height:        Intake/Output Summary (Last 24 hours) at 03/22/2022 1119 Last data filed at 03/22/2022 0845 Gross per 24 hour  Intake 698.44 ml  Output 1650 ml  Net -951.56 ml   14/06/2021  Weights   03/20/22 2311 03/22/22 0500  Weight: 77.1 kg 75.3 kg    Examination:  General exam: Appears calm and comfortable  Respiratory system: Diminished air entry.  Scattered crackles.  Normal work of breathing.  Room air Cardiovascular system: S1-S2, RRR, no murmurs, 1+ pedal edema Gastrointestinal system: Abdomen is nondistended, soft and  nontender. No organomegaly or masses felt. Normal bowel sounds heard. Central nervous system: Alert and oriented. No focal neurological deficits. Extremities: Symmetric 5 x 5 power. Skin: No rashes, lesions or ulcers Psychiatry: Judgement and insight appear normal. Mood & affect appropriate.     Data Reviewed: I have personally reviewed following labs and imaging studies  CBC: Recent Labs  Lab 03/20/22 2316 03/21/22 0442 03/22/22 0433  WBC 7.5 10.4 9.1  NEUTROABS 4.2  --   --   HGB 12.6* 13.3 12.1*  HCT 39.0 42.0 38.2*  MCV 87.8 88.8 87.8  PLT 313 340 293   Basic Metabolic Panel: Recent Labs  Lab 03/20/22 2316 03/21/22 0442 03/22/22 0433  NA 136 136 137  K 3.5 4.2 3.8  CL 105 105 104  CO2 23 25 24   GLUCOSE 113* 135* 111*  BUN 11 11 15   CREATININE 0.86 0.90 0.97  CALCIUM 8.7* 9.0 9.2  MG 2.0  --   --    GFR: Estimated Creatinine Clearance: 76.3 mL/min (by C-G formula based on SCr of 0.97 mg/dL). Liver Function Tests: Recent Labs  Lab 03/20/22 2316  AST 22  ALT 16  ALKPHOS 51  BILITOT 1.0  PROT 6.9  ALBUMIN 3.7   No results for input(s): "LIPASE", "AMYLASE" in the last 168 hours. No results for input(s): "AMMONIA" in the last 168 hours. Coagulation Profile: Recent Labs  Lab 03/20/22 2316  INR 1.3*   Cardiac Enzymes: No results for input(s): "CKTOTAL", "CKMB", "CKMBINDEX", "TROPONINI" in the last 168 hours. BNP (last 3 results) No results for input(s): "PROBNP" in the last 8760 hours. HbA1C: No results for input(s): "HGBA1C" in the last 72 hours. CBG: Recent Labs  Lab 03/21/22 0759 03/21/22 1233 03/21/22 1736 03/21/22 2009 03/22/22 0849  GLUCAP 151* 120* 117* 183* 111*   Lipid Profile: Recent Labs    03/20/22 2316 03/21/22 0442  CHOL 157 151  HDL 36* 39*  LDLCALC 94 96  TRIG 135 82  CHOLHDL 4.4 3.9   Thyroid Function Tests: No results for input(s): "TSH", "T4TOTAL", "FREET4", "T3FREE", "THYROIDAB" in the last 72 hours. Anemia  Panel: No results for input(s): "VITAMINB12", "FOLATE", "FERRITIN", "TIBC", "IRON", "RETICCTPCT" in the last 72 hours. Sepsis Labs: No results for input(s): "PROCALCITON", "LATICACIDVEN" in the last 168 hours.  No results found for this or any previous visit (from the past 240 hour(s)).       Radiology Studies: ECHOCARDIOGRAM COMPLETE  Result Date: 03/21/2022    ECHOCARDIOGRAM REPORT   Patient Name:   Jason Beck Date of Exam: 03/21/2022 Medical Rec #:  [facility]        Height:       70.0 in Accession #:    14/05/2021       Weight:       170.0 lb Date of Birth:  1956/04/07        BSA:          1.948 m Patient Age:    66 years         BP:           131/97 mmHg Patient Gender: M  HR:           111 bpm. Exam Location:  [facility] Procedure: 2D Echo Indications:     Acute MI I21.9  History:         Patient has no prior history of Echocardiogram examinations.  Sonographer:     Overton Mamikeshia Johnson RDCS Referring Phys:  [id number] JAN A MANSY Diagnosing Phys: Alwyn Peawayne D Callwood MD  Sonographer Comments: Technically difficult study due to poor echo windows. Image acquisition challenging due to respiratory motion. Challenging study due to both patient's irregular rhythm (a-fib) and coughing. IMPRESSIONS  1. Left ventricular ejection fraction, by estimation, is 40 to 45%. The left ventricle has mild to moderately decreased function. The left ventricle demonstrates global hypokinesis. The left ventricular internal cavity size was mildly to moderately dilated. Left ventricular diastolic parameters are consistent with Grade I diastolic dysfunction (impaired relaxation).  2. Right ventricular systolic function is low normal. The right ventricular size is mildly enlarged. Mildly increased right ventricular wall thickness.  3. Left atrial size was mild to moderately dilated.  4. Right atrial size was mildly dilated.  5. The mitral valve is myxomatous. Mild to moderate mitral valve regurgitation. Moderate mitral  annular calcification.  6. The aortic valve is bicuspid. There is moderate calcification of the aortic valve. There is moderate thickening of the aortic valve. Aortic valve regurgitation is mild to moderate. Severe aortic valve stenosis.  7. Aortic dilatation noted. There is mild dilatation of the aortic root, measuring 32 mm. FINDINGS  Left Ventricle: Left ventricular ejection fraction, by estimation, is 40 to 45%. The left ventricle has mild to moderately decreased function. The left ventricle demonstrates global hypokinesis. The left ventricular internal cavity size was mildly to moderately dilated. There is borderline left ventricular hypertrophy. Left ventricular diastolic parameters are consistent with Grade I diastolic dysfunction (impaired relaxation). Right Ventricle: The right ventricular size is mildly enlarged. Mildly increased right ventricular wall thickness. Right ventricular systolic function is low normal. Left Atrium: Left atrial size was mild to moderately dilated. Right Atrium: Right atrial size was mildly dilated. Pericardium: There is no evidence of pericardial effusion. Mitral Valve: The mitral valve is myxomatous. There is moderate thickening of the mitral valve leaflet(s). There is moderate calcification of the mitral valve leaflet(s). Moderately decreased mobility of the mitral valve leaflets. Moderate mitral annular  calcification. Mild to moderate mitral valve regurgitation. MV peak gradient, 21.3 mmHg. The mean mitral valve gradient is 7.5 mmHg. Tricuspid Valve: The tricuspid valve is grossly normal. Tricuspid valve regurgitation is mild. Aortic Valve: The aortic valve is bicuspid. There is moderate calcification of the aortic valve. There is moderate thickening of the aortic valve. There is moderate to severe aortic valve annular calcification. Aortic valve regurgitation is mild to moderate. Aortic regurgitation PHT measures 543 msec. Severe aortic stenosis is present. Aortic valve mean  gradient measures 19.8 mmHg. Aortic valve peak gradient measures 34.7 mmHg. Aortic valve area, by VTI measures 0.62 cm. Pulmonic Valve: The pulmonic valve was grossly normal. Pulmonic valve regurgitation is mild. Aorta: Aortic dilatation noted. There is mild dilatation of the aortic root, measuring 32 mm. IAS/Shunts: No atrial level shunt detected by color flow Doppler.  LEFT VENTRICLE PLAX 2D LVIDd:         4.90 cm     Diastology LVIDs:         3.50 cm     LV e' medial:    6.85 cm/s LV PW:         1.20  cm     LV E/e' medial:  29.5 LV IVS:        1.10 cm     LV e' lateral:   5.87 cm/s LVOT diam:     1.80 cm     LV E/e' lateral: 34.4 LV SV:         33 LV SV Index:   17 LVOT Area:     2.54 cm  LV Volumes (MOD) LV vol d, MOD A4C: 39.1 ml LV vol s, MOD A4C: 22.9 ml LV SV MOD A4C:     39.1 ml RIGHT VENTRICLE RV Basal diam:  3.10 cm RV S prime:     10.00 cm/s TAPSE (M-mode): 1.2 cm LEFT ATRIUM             Index        RIGHT ATRIUM           Index LA diam:        4.90 cm 2.52 cm/m   RA Area:     12.60 cm LA Vol (A2C):   50.6 ml 25.98 ml/m  RA Volume:   31.40 ml  16.12 ml/m LA Vol (A4C):   48.6 ml 24.95 ml/m LA Biplane Vol: 53.9 ml 27.67 ml/m  AORTIC VALVE AV Area (Vmax):    0.60 cm AV Area (Vmean):   0.55 cm AV Area (VTI):     0.62 cm AV Vmax:           294.60 cm/s AV Vmean:          206.000 cm/s AV VTI:            0.530 m AV Peak Grad:      34.7 mmHg AV Mean Grad:      19.8 mmHg LVOT Vmax:         69.50 cm/s LVOT Vmean:        44.800 cm/s LVOT VTI:          0.128 m LVOT/AV VTI ratio: 0.24 AI PHT:            543 msec  AORTA Ao Root diam: 3.20 cm MITRAL VALVE                TRICUSPID VALVE MV Area (PHT): 1.92 cm     TR Peak grad:   63.7 mmHg MV Area VTI:   0.82 cm     TR Vmax:        399.00 cm/s MV Peak grad:  21.3 mmHg MV Mean grad:  7.5 mmHg     SHUNTS MV Vmax:       2.31 m/s     Systemic VTI:  0.13 m MV Vmean:      118.0 cm/s   Systemic Diam: 1.80 cm MV Decel Time: 395 msec MV E velocity: 202.00 cm/s Alwyn Pea MD Electronically signed by Alwyn Pea MD Signature Date/Time: 03/21/2022/12:56:25 PM    Final    DG Chest Portable 1 View  Result Date: 03/21/2022 CLINICAL DATA:  Chest pain. EXAM: PORTABLE CHEST 1 VIEW COMPARISON:  Portable chest 06/01/2018 FINDINGS: Partially lucent electrical pads overlie the field over the lower left chest. There is overlying monitor wiring. There is mild-to-moderate cardiomegaly with increased projected cardiac size since 2020. There is increased central vascular prominence, and development of mild interstitial edema in the mid to lower lung fields and bilateral trace pleural effusions. There are faint hazy infrahilar opacities in both lungs which are probably due to ground-glass edema,  less likely pneumonitis. Remaining lungs are clear of focal opacity with COPD change. Stable mediastinum with calcification in the aortic arch. There is osteopenia with slight thoracic dextroscoliosis. Thoracic spondylosis. IMPRESSION: 1. Cardiomegaly with mild interstitial edema and trace pleural effusions consistent with CHF or fluid overload. 2. Faint hazy infrahilar opacities which are probably due to ground-glass edema, less likely pneumonitis. 3. COPD. 4. Aortic atherosclerosis. Electronically Signed   By: Almira Bar M.D.   On: 03/21/2022 00:38        Scheduled Meds:  aspirin EC  81 mg Oral Daily   atorvastatin  80 mg Oral Daily   clopidogrel  75 mg Oral Daily   diazepam  10 mg Oral TID   ezetimibe  10 mg Oral Daily   folic acid  1 mg Oral Daily   furosemide  40 mg Intravenous Daily   gabapentin  300 mg Oral TID   insulin aspart  0-5 Units Subcutaneous QHS   insulin aspart  0-9 Units Subcutaneous TID WC   lisinopril  20 mg Oral Daily   methadone  5 mg Oral QHS   metoprolol tartrate  25 mg Oral BID   mometasone-formoterol  2 puff Inhalation BID   multivitamin with minerals  1 tablet Oral Daily   nicotine  21 mg Transdermal Daily   thiamine  100 mg Oral Daily    Or   thiamine  100 mg Intravenous Daily   tiotropium  18 mcg Inhalation Daily   Continuous Infusions:  sodium chloride Stopped (03/21/22 0827)   heparin 1,350 Units/hr (03/21/22 2103)     LOS: 1 day      Tresa Moore, MD [facility]   If 7PM-7AM, please contact night-coverage  03/22/2022, 11:19 AM

## 2022-03-22 NOTE — Discharge Instructions (Signed)
                  Intensive Outpatient Programs  [facility]    [facility] [address]     [address] [place],  [place]     [place], [place] [phone]      [phone]  [facility]   [facility]  (Inpatient and outpatient)  [phone] (Suboxone and Methadone) [address]           [phone]           [facility]    [facility] - Intensive Outpatient [address]     [address] [place], [place] [address]     [place], [place]  [phone]      [phone]  [facility] (Outpatient, Inpatient, Chemical  [facility] (Groups and Residental) (insurance only) [phone]    [place], [place]          [phone]       [facility]    [facility] (for caregivers and family) [address]     [address] [place], [place]     [place], [place] [phone]      [phone]  Residential Treatment Programs  [facility]  [facility](2 years) Residential: 90 days)  [facility] [address]      [address] [place], [place]     [place], [place] [phone]      [phone] or [phone]  [facility]    [facility] [address]      [address] [place], [place]     [place], [place] [phone]      [phone]  [facility]   [facility] ([facility] [address]     [address] [place], [place] [address]     [place], [place] [phone]      [phone] Admissions: 8am-3pm M-F  [facility]: Residential Program (90 Days)              [facility]: [facility]  [place], [place]     [place],   [phone] or [phone]    (Walk in Hours over the weekend or by referral)   [facility]: [facility]:[phone] (for crisis  response 24 hours a day) 

## 2022-03-22 NOTE — Progress Notes (Signed)
[facility] Cardiology    SUBJECTIVE: Patient states to be doing reasonably well now reasonable blood pressure denies palpitations or tachycardia no further chest pain ambulating in the halls on his own without difficulty.  After some concerned about withdrawal   Vitals:   03/21/22 2248 03/22/22 0300 03/22/22 0500 03/22/22 0845  BP: (!) 88/75 (!) 86/59  (!) 148/93  Pulse: 60 84  80  Resp: 20 18  18   Temp: 98 F (36.7 C) 98.8 F (37.1 C)  97.8 F (36.6 C)  TempSrc: Oral Oral  Oral  SpO2: 96% 95%  94%  Weight:   75.3 kg   Height:         Intake/Output Summary (Last 24 hours) at 03/22/2022 1140 Last data filed at 03/22/2022 0845 Gross per 24 hour  Intake 698.44 ml  Output 1650 ml  Net -951.56 ml      PHYSICAL EXAM  General: Well developed, well nourished, in no acute distress HEENT:  Normocephalic and atramatic Neck:  No JVD.  Lungs: Clear bilaterally to auscultation and percussion. Heart: HRRR . Normal S1 and S2 without gallops or murmurs.  Abdomen: Bowel sounds are positive, abdomen soft and non-tender  Msk:  Back normal, normal gait. Normal strength and tone for age. Extremities: No clubbing, cyanosis or edema.   Neuro: Alert and oriented X 3. Psych:  Good affect, responds appropriately   LABS: Basic Metabolic Panel: Recent Labs    03/20/22 2316 03/21/22 0442 03/22/22 0433  NA 136 136 137  K 3.5 4.2 3.8  CL 105 105 104  CO2 23 25 24   GLUCOSE 113* 135* 111*  BUN 11 11 15   CREATININE 0.86 0.90 0.97  CALCIUM 8.7* 9.0 9.2  MG 2.0  --   --    Liver Function Tests: Recent Labs    03/20/22 2316  AST 22  ALT 16  ALKPHOS 51  BILITOT 1.0  PROT 6.9  ALBUMIN 3.7   No results for input(s): "LIPASE", "AMYLASE" in the last 72 hours. CBC: Recent Labs    03/20/22 2316 03/21/22 0442 03/22/22 0433  WBC 7.5 10.4 9.1  NEUTROABS 4.2  --   --   HGB 12.6* 13.3 12.1*  HCT 39.0 42.0 38.2*  MCV 87.8 88.8 87.8  PLT 313 340 293   Cardiac Enzymes: No results for  input(s): "CKTOTAL", "CKMB", "CKMBINDEX", "TROPONINI" in the last 72 hours. BNP: Invalid input(s): "POCBNP" D-Dimer: No results for input(s): "DDIMER" in the last 72 hours. Hemoglobin A1C: No results for input(s): "HGBA1C" in the last 72 hours. Fasting Lipid Panel: Recent Labs    03/21/22 0442  CHOL 151  HDL 39*  LDLCALC 96  TRIG 82  CHOLHDL 3.9   Thyroid Function Tests: No results for input(s): "TSH", "T4TOTAL", "T3FREE", "THYROIDAB" in the last 72 hours.  Invalid input(s): "FREET3" Anemia Panel: No results for input(s): "VITAMINB12", "FOLATE", "FERRITIN", "TIBC", "IRON", "RETICCTPCT" in the last 72 hours.  ECHOCARDIOGRAM COMPLETE  Result Date: 03/21/2022    ECHOCARDIOGRAM REPORT   Patient Name:   Jason Beck Date of Exam: 03/21/2022 Medical Rec #:  [id number]        Height:       70.0 in Accession #:    [id number]       Weight:       170.0 lb Date of Birth:  [birth year]        BSA:          1.948 m Patient Age:    66 years  BP:           131/97 mmHg Patient Gender: M                HR:           111 bpm. Exam Location:  [facility] Procedure: 2D Echo Indications:     Acute MI I21.9  History:         Patient has no prior history of Echocardiogram examinations.  Sonographer:     Overton Mam RDCS Referring Phys:  [id number] JAN A MANSY Diagnosing Phys: Alwyn Pea MD  Sonographer Comments: Technically difficult study due to poor echo windows. Image acquisition challenging due to respiratory motion. Challenging study due to both patient's irregular rhythm (a-fib) and coughing. IMPRESSIONS  1. Left ventricular ejection fraction, by estimation, is 40 to 45%. The left ventricle has mild to moderately decreased function. The left ventricle demonstrates global hypokinesis. The left ventricular internal cavity size was mildly to moderately dilated. Left ventricular diastolic parameters are consistent with Grade I diastolic dysfunction (impaired relaxation).  2. Right ventricular systolic  function is low normal. The right ventricular size is mildly enlarged. Mildly increased right ventricular wall thickness.  3. Left atrial size was mild to moderately dilated.  4. Right atrial size was mildly dilated.  5. The mitral valve is myxomatous. Mild to moderate mitral valve regurgitation. Moderate mitral annular calcification.  6. The aortic valve is bicuspid. There is moderate calcification of the aortic valve. There is moderate thickening of the aortic valve. Aortic valve regurgitation is mild to moderate. Severe aortic valve stenosis.  7. Aortic dilatation noted. There is mild dilatation of the aortic root, measuring 32 mm. FINDINGS  Left Ventricle: Left ventricular ejection fraction, by estimation, is 40 to 45%. The left ventricle has mild to moderately decreased function. The left ventricle demonstrates global hypokinesis. The left ventricular internal cavity size was mildly to moderately dilated. There is borderline left ventricular hypertrophy. Left ventricular diastolic parameters are consistent with Grade I diastolic dysfunction (impaired relaxation). Right Ventricle: The right ventricular size is mildly enlarged. Mildly increased right ventricular wall thickness. Right ventricular systolic function is low normal. Left Atrium: Left atrial size was mild to moderately dilated. Right Atrium: Right atrial size was mildly dilated. Pericardium: There is no evidence of pericardial effusion. Mitral Valve: The mitral valve is myxomatous. There is moderate thickening of the mitral valve leaflet(s). There is moderate calcification of the mitral valve leaflet(s). Moderately decreased mobility of the mitral valve leaflets. Moderate mitral annular  calcification. Mild to moderate mitral valve regurgitation. MV peak gradient, 21.3 mmHg. The mean mitral valve gradient is 7.5 mmHg. Tricuspid Valve: The tricuspid valve is grossly normal. Tricuspid valve regurgitation is mild. Aortic Valve: The aortic valve is  bicuspid. There is moderate calcification of the aortic valve. There is moderate thickening of the aortic valve. There is moderate to severe aortic valve annular calcification. Aortic valve regurgitation is mild to moderate. Aortic regurgitation PHT measures 543 msec. Severe aortic stenosis is present. Aortic valve mean gradient measures 19.8 mmHg. Aortic valve peak gradient measures 34.7 mmHg. Aortic valve area, by VTI measures 0.62 cm. Pulmonic Valve: The pulmonic valve was grossly normal. Pulmonic valve regurgitation is mild. Aorta: Aortic dilatation noted. There is mild dilatation of the aortic root, measuring 32 mm. IAS/Shunts: No atrial level shunt detected by color flow Doppler.  LEFT VENTRICLE PLAX 2D LVIDd:         4.90 cm     Diastology LVIDs:  3.50 cm     LV e' medial:    6.85 cm/s LV PW:         1.20 cm     LV E/e' medial:  29.5 LV IVS:        1.10 cm     LV e' lateral:   5.87 cm/s LVOT diam:     1.80 cm     LV E/e' lateral: 34.4 LV SV:         33 LV SV Index:   17 LVOT Area:     2.54 cm  LV Volumes (MOD) LV vol d, MOD A4C: 39.1 ml LV vol s, MOD A4C: 22.9 ml LV SV MOD A4C:     39.1 ml RIGHT VENTRICLE RV Basal diam:  3.10 cm RV S prime:     10.00 cm/s TAPSE (M-mode): 1.2 cm LEFT ATRIUM             Index        RIGHT ATRIUM           Index LA diam:        4.90 cm 2.52 cm/m   RA Area:     12.60 cm LA Vol (A2C):   50.6 ml 25.98 ml/m  RA Volume:   31.40 ml  16.12 ml/m LA Vol (A4C):   48.6 ml 24.95 ml/m LA Biplane Vol: 53.9 ml 27.67 ml/m  AORTIC VALVE AV Area (Vmax):    0.60 cm AV Area (Vmean):   0.55 cm AV Area (VTI):     0.62 cm AV Vmax:           294.60 cm/s AV Vmean:          206.000 cm/s AV VTI:            0.530 m AV Peak Grad:      34.7 mmHg AV Mean Grad:      19.8 mmHg LVOT Vmax:         69.50 cm/s LVOT Vmean:        44.800 cm/s LVOT VTI:          0.128 m LVOT/AV VTI ratio: 0.24 AI PHT:            543 msec  AORTA Ao Root diam: 3.20 cm MITRAL VALVE                TRICUSPID VALVE MV Area  (PHT): 1.92 cm     TR Peak grad:   63.7 mmHg MV Area VTI:   0.82 cm     TR Vmax:        399.00 cm/s MV Peak grad:  21.3 mmHg MV Mean grad:  7.5 mmHg     SHUNTS MV Vmax:       2.31 m/s     Systemic VTI:  0.13 m MV Vmean:      118.0 cm/s   Systemic Diam: 1.80 cm MV Decel Time: 395 msec MV E velocity: 202.00 cm/s Alwyn Peawayne D Chrissi Crow MD Electronically signed by Alwyn Peawayne D Marguerita Stapp MD Signature Date/Time: 03/21/2022/12:56:25 PM    Final    DG Chest Portable 1 View  Result Date: 03/21/2022 CLINICAL DATA:  Chest pain. EXAM: PORTABLE CHEST 1 VIEW COMPARISON:  Portable chest 06/01/2018 FINDINGS: Partially lucent electrical pads overlie the field over the lower left chest. There is overlying monitor wiring. There is mild-to-moderate cardiomegaly with increased projected cardiac size since 2020. There is increased central vascular prominence, and development of mild interstitial edema in the mid  to lower lung fields and bilateral trace pleural effusions. There are faint hazy infrahilar opacities in both lungs which are probably due to ground-glass edema, less likely pneumonitis. Remaining lungs are clear of focal opacity with COPD change. Stable mediastinum with calcification in the aortic arch. There is osteopenia with slight thoracic dextroscoliosis. Thoracic spondylosis. IMPRESSION: 1. Cardiomegaly with mild interstitial edema and trace pleural effusions consistent with CHF or fluid overload. 2. Faint hazy infrahilar opacities which are probably due to ground-glass edema, less likely pneumonitis. 3. COPD. 4. Aortic atherosclerosis. Electronically Signed   By: Almira Bar M.D.   On: 03/21/2022 00:38     Echo mildly reduced left ventricular function EF around 40 to 45%  TELEMETRY: Atrial fibrillation rate control around 100:  ASSESSMENT AND PLAN:  Principal Problem:   NSTEMI (non-ST elevated myocardial infarction) (HCC) Active Problems:   Type 2 diabetes mellitus with peripheral neuropathy (HCC)    Dyslipidemia   Depression   Chronic obstructive pulmonary disease (COPD) (HCC)   Acute CHF (congestive heart failure) (HCC)   New onset atrial fibrillation (HCC) Chronic pain syndrome Alcohol abuse Elevated troponins possible related to demand ischemia  Plan Possible non-STEMI versus demand ischemia would recommend conservative medical therapy discontinue heparin maintain aspirin Plavix Switch to DVT prophylaxis with Lovenox in place of ACS related heparin therapy Continue statin therapy for hyperlipidemia Recommend inhalers as necessary for COPD type symptoms advised patient refrain from tobacco abuse Acute on chronic systolic congestive heart failure improving continue medical therapy Acute atrial fibrillation new onset with rapid ventricular response somewhat improved would recommend rate control consider amiodarone placed on anticoagulation with Eliquis Diabetes continue management and control continue follow-up with primary physician and endocrinology Continue to treat for possible withdrawal symptoms CIWA protocol if necessary Would recommend a conservative approach from a cardiology standpoint we will probably defer any invasive procedure like cardiac catheter the patient remains asymptomatic   Alwyn Pea, MD 03/22/2022 11:40 AM

## 2022-03-22 NOTE — Progress Notes (Signed)
ANTICOAGULATION CONSULT NOTE  Pharmacy Consult for heparin infusion Indication: Unstable angina / A fib.  Allergies  Allergen Reactions   Niacin Other (See Comments)   Penicillin G     Patient Measurements: Height: 5\' 10"  (177.8 cm) Weight: 75.3 kg (166 lb 0.1 oz) IBW/kg (Calculated) : 73 Heparin Dosing Weight: 77.1 kg  Vital Signs: Temp: 98.8 F (37.1 C) (12/03 0300) Temp Source: Oral (12/03 0300) BP: 86/59 (12/03 0300) Pulse Rate: 84 (12/03 0300)  Labs: Recent Labs    03/20/22 2316 03/20/22 2316 03/21/22 0442 03/21/22 0809 03/21/22 1035 03/21/22 1334 03/21/22 1427 03/21/22 1613 03/21/22 2146 03/22/22 0433  HGB 12.6*  --  13.3  --   --   --   --   --   --  12.1*  HCT 39.0  --  42.0  --   --   --   --   --   --  38.2*  PLT 313  --  340  --   --   --   --   --   --  293  APTT 36  --   --   --   --   --   --   --   --   --   LABPROT 15.7*  --   --   --   --   --   --   --   --   --   INR 1.3*  --   --   --   --   --   --   --   --   --   HEPARINUNFRC  --    < > 0.54  --   --  <0.10*  --   --  0.34 0.36  CREATININE 0.86  --  0.90  --   --   --   --   --   --   --   TROPONINIHS 196*  --  720*   < > 707*  --  518* 573*  --   --    < > = values in this interval not displayed.     Estimated Creatinine Clearance: 82.2 mL/min (by C-G formula based on SCr of 0.9 mg/dL).   Medical History: History reviewed. No pertinent past medical history.  Assessment: Pt is a 66 yo male presenting to ED diagnosed w/ NSTEMI and new onset A fib.    Goal of Therapy:  Heparin level 0.3-0.7 units/ml aPTT 66-102 seconds Monitor platelets by anticoagulation protocol: Yes   12/02 0442 HL 0.54, therapeutic x 1 12/2 1334  HL < 0.10   subtherapeutic 12/02 2146 HL 0.34, therapeutic x 1 12/03 0433 HL 0.36, therapeutic x 2   Plan:  Continue heparin infusion at 1350 units/hr Recheck HL w/ AM labs daily while therapeutic CBC daily while on heparin  14/03, PharmD,  [facility] 03/22/2022 5:35 AM

## 2022-03-23 DIAGNOSIS — I214 Non-ST elevation (NSTEMI) myocardial infarction: Secondary | ICD-10-CM | POA: Diagnosis not present

## 2022-03-23 LAB — BASIC METABOLIC PANEL
Anion gap: 9 (ref 5–15)
BUN: 22 mg/dL (ref 8–23)
CO2: 25 mmol/L (ref 22–32)
Calcium: 8.7 mg/dL — ABNORMAL LOW (ref 8.9–10.3)
Chloride: 102 mmol/L (ref 98–111)
Creatinine, Ser: 1.14 mg/dL (ref 0.61–1.24)
GFR, Estimated: >60 mL/min (ref >60)
Glucose, Bld: 134 mg/dL — ABNORMAL HIGH (ref 70–99)
Potassium: 3.6 mmol/L (ref 3.5–5.1)
Sodium: 136 mmol/L (ref 135–145)

## 2022-03-23 LAB — CBC
HCT: 35 % — ABNORMAL LOW (ref 39.0–52.0)
Hemoglobin: 11.2 g/dL — ABNORMAL LOW (ref 13.0–17.0)
MCH: 28.3 pg (ref 26.0–34.0)
MCHC: 32 g/dL (ref 30.0–36.0)
MCV: 88.4 fL (ref 80.0–100.0)
Platelets: 269 10*3/uL (ref 150–400)
RBC: 3.96 MIL/uL — ABNORMAL LOW (ref 4.22–5.81)
RDW: 15.4 % (ref 11.5–15.5)
WBC: 10.9 10*3/uL — ABNORMAL HIGH (ref 4.0–10.5)
nRBC: 0 % (ref 0.0–0.2)

## 2022-03-23 LAB — HEMOGLOBIN A1C
Hgb A1c MFr Bld: 5.8 % — ABNORMAL HIGH (ref 4.8–5.6)
Mean Plasma Glucose: 120 mg/dL

## 2022-03-23 LAB — GLUCOSE, CAPILLARY: Glucose-Capillary: 141 mg/dL — ABNORMAL HIGH (ref 70–99)

## 2022-03-23 MED ORDER — METHADONE HCL 5 MG PO TABS: 5.0000 mg | ORAL_TABLET | Freq: Every day | ORAL | 0 refills | Status: DC

## 2022-03-23 MED ORDER — METOPROLOL TARTRATE 25 MG PO TABS: 25.0000 mg | ORAL_TABLET | Freq: Two times a day (BID) | ORAL | 0 refills | Status: DC

## 2022-03-23 MED ORDER — APIXABAN 5 MG PO TABS: 5.0000 mg | ORAL_TABLET | Freq: Two times a day (BID) | ORAL | 0 refills | Status: DC

## 2022-03-23 MED ORDER — NICOTINE 21 MG/24HR TD PT24: 21.0000 mg | MEDICATED_PATCH | Freq: Every day | TRANSDERMAL | 0 refills | Status: DC

## 2022-03-23 MED ORDER — APIXABAN 5 MG PO TABS
5.0000 mg | ORAL_TABLET | Freq: Two times a day (BID) | ORAL | Status: DC
  Administered 2022-03-23: 5 mg via ORAL
  Filled 2022-03-23: qty 1

## 2022-03-23 NOTE — Progress Notes (Signed)
Discharge instructions and med list reviewed with pt who verbalized understanding.  Tele and IV removed.  Pt to F/U with PCP.  Pt discharged via wheelchair with volunteer without incident

## 2022-03-23 NOTE — Progress Notes (Signed)
[facility]    SUBJECTIVE: Patient states to be doing reasonably well complains mild generalized weakness minimal palpitations tachycardia no shortness of breath denies any chest pain ataxic generalized weakness    Vitals:   03/23/22 0400 03/23/22 0500 03/23/22 0823 03/23/22 0900  BP:   99/62 96/78  Pulse:   (!) 59 (!) 112  Resp: 19 18 16 18   Temp:   98.6 F (37 C)   TempSrc:   Oral   SpO2:   97%   Weight:  77 kg    Height:         Intake/Output Summary (Last 24 hours) at 03/23/2022 1159 Last data filed at 03/23/2022 0529 Gross per 24 hour  Intake 660.31 ml  Output 300 ml  Net 360.31 ml      PHYSICAL EXAM  General: Well developed, well nourished, in no acute distress HEENT:  Normocephalic and atramatic Neck:  No JVD.  Lungs: Clear bilaterally to auscultation and percussion. Heart: Irregularly irregular. Normal S1 and S2 without gallops or murmurs.  Abdomen: Bowel sounds are positive, abdomen soft and non-tender  Msk:  Back normal, normal gait. Normal strength and tone for age. Extremities: No clubbing, cyanosis or edema.   Neuro: Alert and oriented X 3. Psych:  Good affect, responds appropriately   LABS: Basic Metabolic Panel: Recent Labs    03/20/22 2316 03/21/22 0442 03/22/22 0433 03/23/22 0423  NA 136   < > 137 136  K 3.5   < > 3.8 3.6  CL 105   < > 104 102  CO2 23   < > 24 25  GLUCOSE 113*   < > 111* 134*  BUN 11   < > 15 22  CREATININE 0.86   < > 0.97 1.14  CALCIUM 8.7*   < > 9.2 8.7*  MG 2.0  --   --   --    < > = values in this interval not displayed.   Liver Function Tests: Recent Labs    03/20/22 2316  AST 22  ALT 16  ALKPHOS 51  BILITOT 1.0  PROT 6.9  ALBUMIN 3.7   No results for input(s): "LIPASE", "AMYLASE" in the last 72 hours. CBC: Recent Labs    03/20/22 2316 03/21/22 0442 03/22/22 0433 03/23/22 0423  WBC 7.5   < > 9.1 10.9*  NEUTROABS 4.2  --   --   --   HGB 12.6*   < > 12.1* 11.2*  HCT 39.0   < > 38.2* 35.0*  MCV  87.8   < > 87.8 88.4  PLT 313   < > 293 269   < > = values in this interval not displayed.   Cardiac Enzymes: No results for input(s): "CKTOTAL", "CKMB", "CKMBINDEX", "TROPONINI" in the last 72 hours. BNP: Invalid input(s): "POCBNP" D-Dimer: No results for input(s): "DDIMER" in the last 72 hours. Hemoglobin A1C: Recent Labs    03/21/22 0442  HGBA1C 5.8*   Fasting Lipid Panel: Recent Labs    03/21/22 0442  CHOL 151  HDL 39*  LDLCALC 96  TRIG 82  CHOLHDL 3.9   Thyroid Function Tests: No results for input(s): "TSH", "T4TOTAL", "T3FREE", "THYROIDAB" in the last 72 hours.  Invalid input(s): "FREET3" Anemia Panel: No results for input(s): "VITAMINB12", "FOLATE", "FERRITIN", "TIBC", "IRON", "RETICCTPCT" in the last 72 hours.  ECHOCARDIOGRAM COMPLETE  Result Date: 03/21/2022    ECHOCARDIOGRAM REPORT   Patient Name:   Jason Beck Date of Exam: 03/21/2022 Medical Rec #:  [id number]        Height:       70.0 in Accession #:    [id number]       Weight:       170.0 lb Date of Birth:  January 20, 1956        BSA:          1.948 m Patient Age:    66 years         BP:           131/97 mmHg Patient Gender: M                HR:           111 bpm. Exam Location:  [facility] Procedure: 2D Echo Indications:     Acute MI I21.9  History:         Patient has no prior history of Echocardiogram examinations.  Sonographer:     Overton Mam RDCS Referring Phys:  [id number] JAN A MANSY Diagnosing Phys: Alwyn Pea MD  Sonographer Comments: Technically difficult study due to poor echo windows. Image acquisition challenging due to respiratory motion. Challenging study due to both patient's irregular rhythm (a-fib) and coughing. IMPRESSIONS  1. Left ventricular ejection fraction, by estimation, is 40 to 45%. The left ventricle has mild to moderately decreased function. The left ventricle demonstrates global hypokinesis. The left ventricular internal cavity size was mildly to moderately dilated. Left ventricular  diastolic parameters are consistent with Grade I diastolic dysfunction (impaired relaxation).  2. Right ventricular systolic function is low normal. The right ventricular size is mildly enlarged. Mildly increased right ventricular wall thickness.  3. Left atrial size was mild to moderately dilated.  4. Right atrial size was mildly dilated.  5. The mitral valve is myxomatous. Mild to moderate mitral valve regurgitation. Moderate mitral annular calcification.  6. The aortic valve is bicuspid. There is moderate calcification of the aortic valve. There is moderate thickening of the aortic valve. Aortic valve regurgitation is mild to moderate. Severe aortic valve stenosis.  7. Aortic dilatation noted. There is mild dilatation of the aortic root, measuring 32 mm. FINDINGS  Left Ventricle: Left ventricular ejection fraction, by estimation, is 40 to 45%. The left ventricle has mild to moderately decreased function. The left ventricle demonstrates global hypokinesis. The left ventricular internal cavity size was mildly to moderately dilated. There is borderline left ventricular hypertrophy. Left ventricular diastolic parameters are consistent with Grade I diastolic dysfunction (impaired relaxation). Right Ventricle: The right ventricular size is mildly enlarged. Mildly increased right ventricular wall thickness. Right ventricular systolic function is low normal. Left Atrium: Left atrial size was mild to moderately dilated. Right Atrium: Right atrial size was mildly dilated. Pericardium: There is no evidence of pericardial effusion. Mitral Valve: The mitral valve is myxomatous. There is moderate thickening of the mitral valve leaflet(s). There is moderate calcification of the mitral valve leaflet(s). Moderately decreased mobility of the mitral valve leaflets. Moderate mitral annular  calcification. Mild to moderate mitral valve regurgitation. MV peak gradient, 21.3 mmHg. The mean mitral valve gradient is 7.5 mmHg. Tricuspid  Valve: The tricuspid valve is grossly normal. Tricuspid valve regurgitation is mild. Aortic Valve: The aortic valve is bicuspid. There is moderate calcification of the aortic valve. There is moderate thickening of the aortic valve. There is moderate to severe aortic valve annular calcification. Aortic valve regurgitation is mild to moderate. Aortic regurgitation PHT measures 543 msec. Severe aortic stenosis is present. Aortic valve mean gradient measures 19.8 mmHg.  Aortic valve peak gradient measures 34.7 mmHg. Aortic valve area, by VTI measures 0.62 cm. Pulmonic Valve: The pulmonic valve was grossly normal. Pulmonic valve regurgitation is mild. Aorta: Aortic dilatation noted. There is mild dilatation of the aortic root, measuring 32 mm. IAS/Shunts: No atrial level shunt detected by color flow Doppler.  LEFT VENTRICLE PLAX 2D LVIDd:         4.90 cm     Diastology LVIDs:         3.50 cm     LV e' medial:    6.85 cm/s LV PW:         1.20 cm     LV E/e' medial:  29.5 LV IVS:        1.10 cm     LV e' lateral:   5.87 cm/s LVOT diam:     1.80 cm     LV E/e' lateral: 34.4 LV SV:         33 LV SV Index:   17 LVOT Area:     2.54 cm  LV Volumes (MOD) LV vol d, MOD A4C: 39.1 ml LV vol s, MOD A4C: 22.9 ml LV SV MOD A4C:     39.1 ml RIGHT VENTRICLE RV Basal diam:  3.10 cm RV S prime:     10.00 cm/s TAPSE (M-mode): 1.2 cm LEFT ATRIUM             Index        RIGHT ATRIUM           Index LA diam:        4.90 cm 2.52 cm/m   RA Area:     12.60 cm LA Vol (A2C):   50.6 ml 25.98 ml/m  RA Volume:   31.40 ml  16.12 ml/m LA Vol (A4C):   48.6 ml 24.95 ml/m LA Biplane Vol: 53.9 ml 27.67 ml/m  AORTIC VALVE AV Area (Vmax):    0.60 cm AV Area (Vmean):   0.55 cm AV Area (VTI):     0.62 cm AV Vmax:           294.60 cm/s AV Vmean:          206.000 cm/s AV VTI:            0.530 m AV Peak Grad:      34.7 mmHg AV Mean Grad:      19.8 mmHg LVOT Vmax:         69.50 cm/s LVOT Vmean:        44.800 cm/s LVOT VTI:          0.128 m LVOT/AV VTI  ratio: 0.24 AI PHT:            543 msec  AORTA Ao Root diam: 3.20 cm MITRAL VALVE                TRICUSPID VALVE MV Area (PHT): 1.92 cm     TR Peak grad:   63.7 mmHg MV Area VTI:   0.82 cm     TR Vmax:        399.00 cm/s MV Peak grad:  21.3 mmHg MV Mean grad:  7.5 mmHg     SHUNTS MV Vmax:       2.31 m/s     Systemic VTI:  0.13 m MV Vmean:      118.0 cm/s   Systemic Diam: 1.80 cm MV Decel Time: 395 msec MV E velocity: 202.00 cm/s Alwyn Peawayne D Shaina Gullatt MD Electronically signed by  Alwyn Pea MD Signature Date/Time: 03/21/2022/12:56:25 PM    Final      Echo mildly depresses LVF EF=40-45%  TELEMETRY: AFIB 100 nsstw :  ASSESSMENT AND PLAN:  Principal Problem:   NSTEMI (non-ST elevated myocardial infarction) (HCC) Active Problems:   Type 2 diabetes mellitus with peripheral neuropathy (HCC)   Dyslipidemia   Depression   Chronic obstructive pulmonary disease (COPD) (HCC)   Acute CHF (congestive heart failure) (HCC)   New onset atrial fibrillation (HCC)     Plan Atrial flutter relation new onset recommend Eliquis metoprolol hold off on amiodarone for now 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% consider cardiomyopathy therapy ACE ARB or Entresto beta-blocker Lasix consider spironolactone Hypertension continue hypertensive medication Recommend statin therapy for hyperlipidemia Advised patient refrain from tobacco abuse Inhalers as necessary for COPD Elevated troponin is consistent with probable demand ischemia mild non-STEMI is less likely but cannot be completely excluded deferred invasive evaluation CT patient is asymptomatic and advised to follow-up with the [facility] cardiology Significant alcohol abuse with possible withdrawal symptoms advised patient refrain from alcohol abuse Diabetes continue current management and care    Alwyn Pea, MD 03/23/2022 11:59 AM

## 2022-03-23 NOTE — Discharge Summary (Signed)
Physician Discharge Summary  Jason Beck EZM:[id number] DOB: June 04, 1955 DOA: 03/20/2022  PCP: Jason Curt, MD  Admit date: 03/20/2022 Discharge date: 03/23/2022  Admitted From: Home Disposition:  Home  Recommendations for Outpatient Follow-up:  Follow up with PCP in 1-2 weeks Follow up with [facility] cardiology  Home Health:No  Equipment/Devices:None   Discharge Condition:Stable  CODE STATUS:FULL  Diet recommendation: Heart   Brief/Interim Summary: 66 year old male history of hypertension, tobacco use, diabetes, COPD, bicuspid aortic valve who presents to the ED with acute onset of midsternal chest pain.  EKG initially concerning for STEMI.  Code STEMI called and interventional cardiology contacted however after review of EKG code STEMI canceled and NSTEMI treatment initiated.   On my arrival patient is overall stable.  He is in no distress.  He remains in rapid atrial fibrillation.  I believe this is new onset.  He is on heparin gtt.  Cardiology consulted on admission.   Patient is also a daily drinker and exhibited some signs of mild withdrawal on 12/2.  CIWA protocol started with as needed Ativan and scheduled diazepam.  Appears more stable on 12/3  Tachycardia improved.  Does continue to have elevated heart rate at time.  Mostly associated with mild withdrawal, ambulation, nicotine cravings.  Patient is also on very high-dose chronic narcotics which could be exacerbating the situation.  At time of discharge will recommend initiation of apixaban for VTE prophylaxis, continuation of Plavix, temporarily hold aspirin until seen by Derm via cardiology.  Will discharge on metoprolol 25 mg p.o. twice daily for rate control.    Discharge Diagnoses:  Principal Problem:   NSTEMI (non-ST elevated myocardial infarction) (HCC) Active Problems:   Acute CHF (congestive heart failure) (HCC)   New onset atrial fibrillation (HCC)   Type 2 diabetes mellitus with peripheral  neuropathy (HCC)   Dyslipidemia   Depression   Chronic obstructive pulmonary disease (COPD) (HCC)  NSTEMI Cardiology consulted.  Code STEMI initially called on admission was canceled.  Patient was placed on intravenous heparin.  Troponins trended to peak.  Peak around 800. Plan: Completed 48 hours of heparin GTT.  Conservative medical management.  Will discharge on Plavix and Eliquis.  Aspirin held.  Continue beta-blocker, high intensity statin, ACE inhibitor.  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  Patient presented with mild fluid overload. Has been diuresing effectively Plan: Patient euvolemic.  Hold Lasix at time of discharge.  Outpatient follow-up with your [facility] cardiology.   New onset atrial fibrillation Discharged on apixaban 5 mg twice daily and metoprolol tartrate 25 mg twice daily.  Will need follow-up during via cardiology.  Alcohol use disorder Tobacco use Patient states he drinks a sixpack per day.  He exhibited signs of withdrawal on 12/2.  Started on CIWA protocol. Plan: Educated on smoking cessation CIWA protocol Withdrawal improved to 10   Hyperlipidemia PTA Lipitor, Tricor, Zetia    Discharge Instructions  Discharge Instructions     Diet - low sodium heart healthy   Complete by: As directed    Increase activity slowly   Complete by: As directed       Allergies as of 03/23/2022       Reactions   Niacin Other (See Comments)   Penicillin G         Medication List     STOP taking these medications    aspirin 81 MG chewable  tablet   buPROPion 150 MG 24 hr tablet Commonly known as: WELLBUTRIN XL   ezetimibe 10 MG tablet Commonly known as: ZETIA   fenofibrate 145 MG tablet Commonly known as: TRICOR   predniSONE 10 MG tablet Commonly known as: DELTASONE       TAKE these medications    apixaban 5  MG Tabs tablet Commonly known as: ELIQUIS Take 1 tablet (5 mg total) by mouth 2 (two) times daily.   atorvastatin 80 MG tablet Commonly known as: LIPITOR Take 80 mg by mouth daily.   ciclesonide 160 MCG/ACT inhaler Commonly known as: ALVESCO Inhale 2 puffs into the lungs 2 (two) times daily.   clopidogrel 75 MG tablet Commonly known as: PLAVIX Take 75 mg by mouth daily.   fluticasone-salmeterol 250-50 MCG/ACT Aepb Commonly known as: ADVAIR Inhale 1 puff into the lungs in the morning and at bedtime.   gabapentin 300 MG capsule Commonly known as: NEURONTIN Take 300 mg by mouth 3 (three) times daily.   lisinopril 20 MG tablet Commonly known as: ZESTRIL Take 10 mg by mouth daily.   metFORMIN 500 MG tablet Commonly known as: GLUCOPHAGE Take 500 mg by mouth 2 (two) times daily with a meal.   methadone 5 MG tablet Commonly known as: DOLOPHINE Take 1 tablet (5 mg total) by mouth at bedtime. What changed:  medication strength how much to take when to take this   metoprolol tartrate 25 MG tablet Commonly known as: LOPRESSOR Take 1 tablet (25 mg total) by mouth 2 (two) times daily.   nicotine 21 mg/24hr patch Commonly known as: NICODERM CQ - dosed in mg/24 hours Place 1 patch (21 mg total) onto the skin daily. Start taking on: March 24, 2022   oxycodone 5 MG capsule Commonly known as: OXY-IR Take 5 mg by mouth every 4 (four) hours as needed.   tiotropium 18 MCG inhalation capsule Commonly known as: SPIRIVA Place 18 mcg into inhaler and inhale daily.   varenicline 1 MG tablet Commonly known as: CHANTIX Take 1 mg by mouth 2 (two) times daily.        Allergies  Allergen Reactions   Niacin Other (See Comments)   Penicillin G     Consultations: Cardiology   Procedures/Studies: ECHOCARDIOGRAM COMPLETE  Result Date: 03/21/2022    ECHOCARDIOGRAM REPORT   Patient Name:   Jason Beck Date of Exam: 03/21/2022 Medical Rec #:  [id number]        Height:        70.0 in Accession #:    [id number]       Weight:       170.0 lb Date of Birth:  1956/01/18        BSA:          1.948 m Patient Age:    66 years         BP:           131/97 mmHg Patient Gender: M                HR:           111 bpm. Exam Location:  [facility] Procedure: 2D Echo Indications:     Acute MI I21.9  History:         Patient has no prior history of Echocardiogram examinations.  Sonographer:     Overton Mam RDCS Referring Phys:  [id number] Vanessa Kick A MANSY Diagnosing Phys: Alwyn Pea MD  Sonographer Comments: Technically difficult study due to poor  echo windows. Image acquisition challenging due to respiratory motion. Challenging study due to both patient's irregular rhythm (a-fib) and coughing. IMPRESSIONS  1. Left ventricular ejection fraction, by estimation, is 40 to 45%. The left ventricle has mild to moderately decreased function. The left ventricle demonstrates global hypokinesis. The left ventricular internal cavity size was mildly to moderately dilated. Left ventricular diastolic parameters are consistent with Grade I diastolic dysfunction (impaired relaxation).  2. Right ventricular systolic function is low normal. The right ventricular size is mildly enlarged. Mildly increased right ventricular wall thickness.  3. Left atrial size was mild to moderately dilated.  4. Right atrial size was mildly dilated.  5. The mitral valve is myxomatous. Mild to moderate mitral valve regurgitation. Moderate mitral annular calcification.  6. The aortic valve is bicuspid. There is moderate calcification of the aortic valve. There is moderate thickening of the aortic valve. Aortic valve regurgitation is mild to moderate. Severe aortic valve stenosis.  7. Aortic dilatation noted. There is mild dilatation of the aortic root, measuring 32 mm. FINDINGS  Left Ventricle: Left ventricular ejection fraction, by estimation, is 40 to 45%. The left ventricle has mild to moderately decreased function. The left ventricle  demonstrates global hypokinesis. The left ventricular internal cavity size was mildly to moderately dilated. There is borderline left ventricular hypertrophy. Left ventricular diastolic parameters are consistent with Grade I diastolic dysfunction (impaired relaxation). Right Ventricle: The right ventricular size is mildly enlarged. Mildly increased right ventricular wall thickness. Right ventricular systolic function is low normal. Left Atrium: Left atrial size was mild to moderately dilated. Right Atrium: Right atrial size was mildly dilated. Pericardium: There is no evidence of pericardial effusion. Mitral Valve: The mitral valve is myxomatous. There is moderate thickening of the mitral valve leaflet(s). There is moderate calcification of the mitral valve leaflet(s). Moderately decreased mobility of the mitral valve leaflets. Moderate mitral annular  calcification. Mild to moderate mitral valve regurgitation. MV peak gradient, 21.3 mmHg. The mean mitral valve gradient is 7.5 mmHg. Tricuspid Valve: The tricuspid valve is grossly normal. Tricuspid valve regurgitation is mild. Aortic Valve: The aortic valve is bicuspid. There is moderate calcification of the aortic valve. There is moderate thickening of the aortic valve. There is moderate to severe aortic valve annular calcification. Aortic valve regurgitation is mild to moderate. Aortic regurgitation PHT measures 543 msec. Severe aortic stenosis is present. Aortic valve mean gradient measures 19.8 mmHg. Aortic valve peak gradient measures 34.7 mmHg. Aortic valve area, by VTI measures 0.62 cm. Pulmonic Valve: The pulmonic valve was grossly normal. Pulmonic valve regurgitation is mild. Aorta: Aortic dilatation noted. There is mild dilatation of the aortic root, measuring 32 mm. IAS/Shunts: No atrial level shunt detected by color flow Doppler.  LEFT VENTRICLE PLAX 2D LVIDd:         4.90 cm     Diastology LVIDs:         3.50 cm     LV e' medial:    6.85 cm/s LV PW:          1.20 cm     LV E/e' medial:  29.5 LV IVS:        1.10 cm     LV e' lateral:   5.87 cm/s LVOT diam:     1.80 cm     LV E/e' lateral: 34.4 LV SV:         33 LV SV Index:   17 LVOT Area:     2.54 cm  LV Volumes (MOD)  LV vol d, MOD A4C: 39.1 ml LV vol s, MOD A4C: 22.9 ml LV SV MOD A4C:     39.1 ml RIGHT VENTRICLE RV Basal diam:  3.10 cm RV S prime:     10.00 cm/s TAPSE (M-mode): 1.2 cm LEFT ATRIUM             Index        RIGHT ATRIUM           Index LA diam:        4.90 cm 2.52 cm/m   RA Area:     12.60 cm LA Vol (A2C):   50.6 ml 25.98 ml/m  RA Volume:   31.40 ml  16.12 ml/m LA Vol (A4C):   48.6 ml 24.95 ml/m LA Biplane Vol: 53.9 ml 27.67 ml/m  AORTIC VALVE AV Area (Vmax):    0.60 cm AV Area (Vmean):   0.55 cm AV Area (VTI):     0.62 cm AV Vmax:           294.60 cm/s AV Vmean:          206.000 cm/s AV VTI:            0.530 m AV Peak Grad:      34.7 mmHg AV Mean Grad:      19.8 mmHg LVOT Vmax:         69.50 cm/s LVOT Vmean:        44.800 cm/s LVOT VTI:          0.128 m LVOT/AV VTI ratio: 0.24 AI PHT:            543 msec  AORTA Ao Root diam: 3.20 cm MITRAL VALVE                TRICUSPID VALVE MV Area (PHT): 1.92 cm     TR Peak grad:   63.7 mmHg MV Area VTI:   0.82 cm     TR Vmax:        399.00 cm/s MV Peak grad:  21.3 mmHg MV Mean grad:  7.5 mmHg     SHUNTS MV Vmax:       2.31 m/s     Systemic VTI:  0.13 m MV Vmean:      118.0 cm/s   Systemic Diam: 1.80 cm MV Decel Time: 395 msec MV E velocity: 202.00 cm/s Alwyn Pea MD Electronically signed by Alwyn Pea MD Signature Date/Time: 03/21/2022/12:56:25 PM    Final    DG Chest Portable 1 View  Result Date: 03/21/2022 CLINICAL DATA:  Chest pain. EXAM: PORTABLE CHEST 1 VIEW COMPARISON:  Portable chest 06/01/2018 FINDINGS: Partially lucent electrical pads overlie the field over the lower left chest. There is overlying monitor wiring. There is mild-to-moderate cardiomegaly with increased projected cardiac size since 2020. There is increased  central vascular prominence, and development of mild interstitial edema in the mid to lower lung fields and bilateral trace pleural effusions. There are faint hazy infrahilar opacities in both lungs which are probably due to ground-glass edema, less likely pneumonitis. Remaining lungs are clear of focal opacity with COPD change. Stable mediastinum with calcification in the aortic arch. There is osteopenia with slight thoracic dextroscoliosis. Thoracic spondylosis. IMPRESSION: 1. Cardiomegaly with mild interstitial edema and trace pleural effusions consistent with CHF or fluid overload. 2. Faint hazy infrahilar opacities which are probably due to ground-glass edema, less likely pneumonitis. 3. COPD. 4. Aortic atherosclerosis. Electronically Signed   By: Earlean Shawl.D.  On: 03/21/2022 00:38      Subjective: Seen and examined on the day of discharge.  Stable no distress.  Stable for discharge home.  Discharge Exam: Vitals:   03/23/22 0823 03/23/22 0900  BP: 99/62 96/78  Pulse: (!) 59 (!) 112  Resp: 16 18  Temp: 98.6 F (37 C)   SpO2: 97%    Vitals:   03/23/22 0400 03/23/22 0500 03/23/22 0823 03/23/22 0900  BP:   99/62 96/78  Pulse:   (!) 59 (!) 112  Resp: 19 18 16 18   Temp:   98.6 F (37 C)   TempSrc:   Oral   SpO2:   97%   Weight:  77 kg    Height:        General: Pt is alert, awake, not in acute distress Cardiovascular: RRR, S1/S2 +, no rubs, no gallops Respiratory: CTA bilaterally, no wheezing, no rhonchi Abdominal: Soft, NT, ND, bowel sounds + Extremities: no edema, no cyanosis    The results of significant diagnostics from this hospitalization (including imaging, microbiology, ancillary and laboratory) are listed below for reference.     Microbiology: No results found for this or any previous visit (from the past 240 hour(s)).   Labs: BNP (last 3 results) Recent Labs    03/20/22 2316  BNP 695.9*   Basic Metabolic Panel: Recent Labs  Lab 03/20/22 2316  03/21/22 0442 03/22/22 0433 03/23/22 0423  NA 136 136 137 136  K 3.5 4.2 3.8 3.6  CL 105 105 104 102  CO2 23 25 24 25   GLUCOSE 113* 135* 111* 134*  BUN 11 11 15 22   CREATININE 0.86 0.90 0.97 1.14  CALCIUM 8.7* 9.0 9.2 8.7*  MG 2.0  --   --   --    Liver Function Tests: Recent Labs  Lab 03/20/22 2316  AST 22  ALT 16  ALKPHOS 51  BILITOT 1.0  PROT 6.9  ALBUMIN 3.7   No results for input(s): "LIPASE", "AMYLASE" in the last 168 hours. No results for input(s): "AMMONIA" in the last 168 hours. CBC: Recent Labs  Lab 03/20/22 2316 03/21/22 0442 03/22/22 0433 03/23/22 0423  WBC 7.5 10.4 9.1 10.9*  NEUTROABS 4.2  --   --   --   HGB 12.6* 13.3 12.1* 11.2*  HCT 39.0 42.0 38.2* 35.0*  MCV 87.8 88.8 87.8 88.4  PLT 313 340 293 269   Cardiac Enzymes: No results for input(s): "CKTOTAL", "CKMB", "CKMBINDEX", "TROPONINI" in the last 168 hours. BNP: Invalid input(s): "POCBNP" CBG: Recent Labs  Lab 03/21/22 2009 03/22/22 0849 03/22/22 1145 03/22/22 2123 03/23/22 0823  GLUCAP 183* 111* 179* 227* 141*   D-Dimer No results for input(s): "DDIMER" in the last 72 hours. Hgb A1c Recent Labs    03/21/22 0442  HGBA1C 5.8*   Lipid Profile Recent Labs    03/20/22 2316 03/21/22 0442  CHOL 157 151  HDL 36* 39*  LDLCALC 94 96  TRIG 135 82  CHOLHDL 4.4 3.9   Thyroid function studies No results for input(s): "TSH", "T4TOTAL", "T3FREE", "THYROIDAB" in the last 72 hours.  Invalid input(s): "FREET3" Anemia work up No results for input(s): "VITAMINB12", "FOLATE", "FERRITIN", "TIBC", "IRON", "RETICCTPCT" in the last 72 hours. Urinalysis No results found for: "COLORURINE", "APPEARANCEUR", "LABSPEC", "PHURINE", "GLUCOSEU", "HGBUR", "BILIRUBINUR", "KETONESUR", "PROTEINUR", "UROBILINOGEN", "NITRITE", "LEUKOCYTESUR" Sepsis Labs Recent Labs  Lab 03/20/22 2316 03/21/22 0442 03/22/22 0433 03/23/22 0423  WBC 7.5 10.4 9.1 10.9*   Microbiology No results found for this or any  previous  visit (from the past 240 hour(s)).   Time coordinating discharge: Over 30 minutes  SIGNED:   Tresa MooreSudheer B Sosie Gato, MD  [facility] 03/23/2022, 1:26 PM Pager   If 7PM-7AM, please contact night-coverage

## 2022-03-23 NOTE — Plan of Care (Signed)
  Problem: Education: Goal: Knowledge of General Education information will improve Description: Including pain rating scale, medication(s)/side effects and non-pharmacologic comfort measures Outcome: Progressing   Problem: Clinical Measurements: Goal: Respiratory complications will improve Outcome: Progressing Goal: Cardiovascular complication will be avoided Outcome: Progressing   Problem: Activity: Goal: Risk for activity intolerance will decrease Outcome: Progressing   Problem: Clinical Measurements: Goal: Cardiovascular complication will be avoided Outcome: Progressing   Problem: Coping: Goal: Level of anxiety will decrease Outcome: Progressing

## 2022-03-24 LAB — LIPOPROTEIN A (LPA): Lipoprotein (a): 195.3 nmol/L — ABNORMAL HIGH (ref <75.0)

## 2022-04-04 ENCOUNTER — Other Ambulatory Visit: Payer: Self-pay

## 2022-04-04 ENCOUNTER — Emergency Department: Payer: No Typology Code available for payment source

## 2022-04-04 ENCOUNTER — Emergency Department
Admission: EM | Admit: 2022-04-04 | Discharge: 2022-04-04 | Disposition: A | Discharge status: Home / Self Care | Payer: No Typology Code available for payment source | Attending: Emergency Medicine | Admitting: Emergency Medicine

## 2022-04-04 DIAGNOSIS — R0602 Shortness of breath: Secondary | ICD-10-CM | POA: Diagnosis present

## 2022-04-04 DIAGNOSIS — E119 Type 2 diabetes mellitus without complications: Secondary | ICD-10-CM | POA: Insufficient documentation

## 2022-04-04 DIAGNOSIS — I509 Heart failure, unspecified: Secondary | ICD-10-CM | POA: Diagnosis not present

## 2022-04-04 DIAGNOSIS — I4891 Unspecified atrial fibrillation: Secondary | ICD-10-CM | POA: Insufficient documentation

## 2022-04-04 DIAGNOSIS — I110 Hypertensive heart disease with heart failure: Secondary | ICD-10-CM | POA: Diagnosis not present

## 2022-04-04 HISTORY — DX: Type 2 diabetes mellitus without complications: E11.9

## 2022-04-04 HISTORY — DX: Unspecified atrial fibrillation: I48.91

## 2022-04-04 HISTORY — DX: Bicuspid aortic valve: Q23.81

## 2022-04-04 HISTORY — DX: Congenital insufficiency of aortic valve: Q23.1

## 2022-04-04 LAB — BASIC METABOLIC PANEL
Anion gap: 10 (ref 5–15)
BUN: 8 mg/dL (ref 8–23)
CO2: 23 mmol/L (ref 22–32)
Calcium: 8.9 mg/dL (ref 8.9–10.3)
Chloride: 106 mmol/L (ref 98–111)
Creatinine, Ser: 0.84 mg/dL (ref 0.61–1.24)
GFR, Estimated: >60 mL/min (ref >60)
Glucose, Bld: 133 mg/dL — ABNORMAL HIGH (ref 70–99)
Potassium: 4.2 mmol/L (ref 3.5–5.1)
Sodium: 139 mmol/L (ref 135–145)

## 2022-04-04 LAB — CBC
HCT: 36.2 % — ABNORMAL LOW (ref 39.0–52.0)
Hemoglobin: 11.4 g/dL — ABNORMAL LOW (ref 13.0–17.0)
MCH: 28 pg (ref 26.0–34.0)
MCHC: 31.5 g/dL (ref 30.0–36.0)
MCV: 88.9 fL (ref 80.0–100.0)
Platelets: 344 10*3/uL (ref 150–400)
RBC: 4.07 MIL/uL — ABNORMAL LOW (ref 4.22–5.81)
RDW: 15.5 % (ref 11.5–15.5)
WBC: 7.6 10*3/uL (ref 4.0–10.5)
nRBC: 0 % (ref 0.0–0.2)

## 2022-04-04 LAB — TROPONIN I (HIGH SENSITIVITY)
Troponin I (High Sensitivity): 24 ng/L — ABNORMAL HIGH (ref <18)
Troponin I (High Sensitivity): 30 ng/L — ABNORMAL HIGH (ref <18)

## 2022-04-04 LAB — BRAIN NATRIURETIC PEPTIDE: B Natriuretic Peptide: 722.7 pg/mL — ABNORMAL HIGH (ref 0.0–100.0)

## 2022-04-04 MED ORDER — FUROSEMIDE 20 MG PO TABS: 40.0000 mg | ORAL_TABLET | Freq: Every day | ORAL | 11 refills | Status: DC

## 2022-04-04 MED ORDER — APIXABAN 5 MG PO TABS: 5.0000 mg | ORAL_TABLET | Freq: Two times a day (BID) | ORAL | 0 refills | Status: DC

## 2022-04-04 MED ORDER — METOPROLOL TARTRATE 5 MG/5ML IV SOLN
5.0000 mg | Freq: Once | INTRAVENOUS | Status: AC
  Administered 2022-04-04: 5 mg via INTRAVENOUS
  Filled 2022-04-04: qty 5

## 2022-04-04 MED ORDER — NICOTINE 21 MG/24HR TD PT24: 21.0000 mg | MEDICATED_PATCH | Freq: Every day | TRANSDERMAL | 0 refills | Status: DC

## 2022-04-04 MED ORDER — CLOPIDOGREL BISULFATE 75 MG PO TABS: 75.0000 mg | ORAL_TABLET | Freq: Every day | ORAL | 1 refills | Status: DC

## 2022-04-04 MED ORDER — CICLESONIDE 160 MCG/ACT IN AERS: 2.0000 | INHALATION_SPRAY | Freq: Two times a day (BID) | RESPIRATORY_TRACT | 1 refills | Status: DC

## 2022-04-04 MED ORDER — METOPROLOL TARTRATE 5 MG/5ML IV SOLN
5.0000 mg | Freq: Once | INTRAVENOUS | Status: AC
  Administered 2022-04-04: 5 mg via INTRAVENOUS

## 2022-04-04 MED ORDER — GABAPENTIN 300 MG PO CAPS: 300.0000 mg | ORAL_CAPSULE | Freq: Three times a day (TID) | ORAL | 0 refills | Status: DC

## 2022-04-04 MED ORDER — METFORMIN HCL 500 MG PO TABS: 500.0000 mg | ORAL_TABLET | Freq: Two times a day (BID) | ORAL | 0 refills | Status: DC

## 2022-04-04 MED ORDER — METOPROLOL TARTRATE 25 MG PO TABS: 25.0000 mg | ORAL_TABLET | Freq: Two times a day (BID) | ORAL | 0 refills | Status: DC

## 2022-04-04 MED ORDER — METHADONE HCL 5 MG PO TABS: 5.0000 mg | ORAL_TABLET | Freq: Every day | ORAL | 0 refills | Status: DC

## 2022-04-04 MED ORDER — FLUTICASONE-SALMETEROL 250-50 MCG/ACT IN AEPB: 1.0000 | INHALATION_SPRAY | Freq: Two times a day (BID) | RESPIRATORY_TRACT | 1 refills | Status: DC

## 2022-04-04 MED ORDER — VARENICLINE TARTRATE 1 MG PO TABS: 1.0000 mg | ORAL_TABLET | Freq: Two times a day (BID) | ORAL | 1 refills | Status: DC

## 2022-04-04 MED ORDER — TIOTROPIUM BROMIDE MONOHYDRATE 18 MCG IN CAPS: 18.0000 ug | ORAL_CAPSULE | Freq: Every day | RESPIRATORY_TRACT | 1 refills | Status: AC

## 2022-04-04 MED ORDER — ATORVASTATIN CALCIUM 80 MG PO TABS: 80.0000 mg | ORAL_TABLET | Freq: Every day | ORAL | 3 refills | Status: AC

## 2022-04-04 MED ORDER — FUROSEMIDE 10 MG/ML IJ SOLN
40.0000 mg | Freq: Once | INTRAMUSCULAR | Status: AC
  Administered 2022-04-04: 40 mg via INTRAVENOUS
  Filled 2022-04-04: qty 4

## 2022-04-04 MED ORDER — LISINOPRIL 20 MG PO TABS: 10.0000 mg | ORAL_TABLET | Freq: Every day | ORAL | 1 refills | Status: DC

## 2022-04-04 NOTE — ED Provider Notes (Signed)
[facility] Provider Note    Event Date/Time   First MD Initiated Contact with Patient 04/04/22 [phone]     (approximate)   History   a fib RVR and Shortness of Breath   HPI  Jason Beck is a 66 y.o. male with past medical history of bicuspid aortic valve hypertension diabetes, atrial fibrillation, alcohol use disorder, congestive heart failure with reduced EF who presents because of shortness of breath.  Patient tells me that he started feeling short of breath yesterday and has had nonproductive cough.  Had short-lived episode of chest pain upon awakening last night described as pressure on the right side of her chest that lasted about 5 minutes and was not associated with any other symptoms and has not recurred.  He has some swelling of right leg which he says is normal for him since having surgery on the leg but no left lower extremity swelling.  Was admitted about 10 days ago with A-fib RVR mild CHF exacerbation and NSTEMI that was thought to be due to demand ischemia and was started on Eliquis and beta-blocker.  Currently he gets his medications from the [place] and has not been able to do so since leaving the hospital so has not had any new medications for that he was prescribed.     Past Medical History:  Diagnosis Date   Atrial fibrillation (HCC)    Bicuspid aortic valve    Diabetes mellitus without complication [facility])     Patient Active Problem List   Diagnosis Date Noted   NSTEMI (non-ST elevated myocardial infarction) (HCC) 03/21/2022   Type 2 diabetes mellitus with peripheral neuropathy (HCC) 03/21/2022   Dyslipidemia 03/21/2022   Depression 03/21/2022   Chronic obstructive pulmonary disease (COPD) (HCC) 03/21/2022   Acute CHF (congestive heart failure) (HCC) 03/21/2022   New onset atrial fibrillation (HCC) 03/21/2022     Physical Exam  Triage Vital Signs: ED Triage Vitals  Enc Vitals Group     BP 04/04/22 0941 (!) 134/119     Pulse Rate  04/04/22 0941 (!) 105     Resp 04/04/22 0941 20     Temp 04/04/22 0941 97.9 F (36.6 C)     Temp Source 04/04/22 0941 Oral     SpO2 04/04/22 0940 92 %     Weight 04/04/22 0942 170 lb (77.1 kg)     Height 04/04/22 0942 5\' 10"  (1.778 m)     Head Circumference --      Peak Flow --      Pain Score 04/04/22 0941 0     Pain Loc --      Pain Edu? --      Excl. in GC? --     Most recent vital signs: Vitals:   04/04/22 1230 04/04/22 1253  BP: 121/89   Pulse: 71 98  Resp: 20   Temp:    SpO2: 98% 98%     General: Awake, no distress.  CV:  Good peripheral perfusion.  Trace pitting edema in the right lower extremity no left lower extremity edema Resp:  Normal effort.  Abd:  No distention.  Neuro:             Awake, Alert, Oriented x 3  Other:     ED Results / Procedures / Treatments  Labs (all labs ordered are listed, but only abnormal results are displayed) Labs Reviewed  BASIC METABOLIC PANEL - Abnormal; Notable for the following components:  Result Value   Glucose, Bld 133 (*)    All other components within normal limits  CBC - Abnormal; Notable for the following components:   RBC 4.07 (*)    Hemoglobin 11.4 (*)    HCT 36.2 (*)    All other components within normal limits  BRAIN NATRIURETIC PEPTIDE - Abnormal; Notable for the following components:   B Natriuretic Peptide 722.7 (*)    All other components within normal limits  TROPONIN I (HIGH SENSITIVITY) - Abnormal; Notable for the following components:   Troponin I (High Sensitivity) 24 (*)    All other components within normal limits  TROPONIN I (HIGH SENSITIVITY) - Abnormal; Notable for the following components:   Troponin I (High Sensitivity) 30 (*)    All other components within normal limits     EKG  EKG shows A-fib RVR with PVCs right axis deviation no acute schema changes   RADIOLOGY Chest x-ray reviewed interpreted myself shows interstitial edema and pleural effusion   PROCEDURES:  Critical  Care performed: Yes, see critical care procedure note(s)  .1-3 Lead EKG Interpretation  Performed by: Georga HackingMcHugh, Hjalmar Ballengee Rose, MD Authorized by: Georga HackingMcHugh, Sani Loiseau Rose, MD     Interpretation: abnormal     ECG rate assessment: tachycardic     Rhythm: atrial fibrillation     Ectopy: none     Conduction: normal   .Critical Care  Performed by: Georga HackingMcHugh, Poseidon Pam Rose, MD Authorized by: Georga HackingMcHugh, Folasade Mooty Rose, MD   Critical care provider statement:    Critical care time (minutes):  30   Critical care was time spent personally by me on the following activities:  Development of treatment plan with patient or surrogate, discussions with consultants, evaluation of patient's response to treatment, examination of patient, ordering and review of laboratory studies, ordering and review of radiographic studies, ordering and performing treatments and interventions, pulse oximetry, re-evaluation of patient's condition and review of old charts   The patient is on the cardiac monitor to evaluate for evidence of arrhythmia and/or significant heart rate changes.   MEDICATIONS ORDERED IN ED: Medications  furosemide (LASIX) injection 40 mg (40 mg Intravenous Given 04/04/22 1047)  metoprolol tartrate (LOPRESSOR) injection 5 mg (5 mg Intravenous Given 04/04/22 1048)  metoprolol tartrate (LOPRESSOR) injection 5 mg (5 mg Intravenous Given 04/04/22 1206)  metoprolol tartrate (LOPRESSOR) injection 5 mg (5 mg Intravenous Given 04/04/22 1237)     IMPRESSION / MDM / ASSESSMENT AND PLAN / ED COURSE  I reviewed the triage vital signs and the nursing notes.                              Patient's presentation is most consistent with acute presentation with potential threat to life or bodily function.  Differential diagnosis includes, but is not limited to, CHF exacerbation, ACS, tachycardia induced cardiomyopathy, alcohol withdrawal  Patient is a 66 year old male presents with dyspnea x 1 day.  Has recent diagnosis of A-fib  heart failure discharged about 10 days ago but has not been able to get any of his medications because apparently gets symptoms [facility] and has not received them yet.  He has cough reductive of clear sputum and dyspnea on exertion as well as orthopnea.  Did have a short-lived episode of chest pain but has no chest pain currently or since that time last night.  Patient sats are borderline in the low 90s he is in A-fib with heart rates ranging from 100-1  20s.  He is not in respiratory distress does have crackles at bilateral bases and asymmetric pitting edema which is apparently normal for him.  Plan to diurese and rate control.  Will check labs and chest x-ray.    Patient's chest x-ray to me looks like some volume overload.  Radiology is reading it as potential interstitial lung disease and some scarring at the bases.  Patient's BNP is elevated.  Troponin is 24 and 38 which I suspect is demand related and is relatively flat.  Patient's not having chest pain currently.  He received 5 mg x 3 doses of IV metoprolol and heart rates were consistently in the high 90s to low 100s.  Patient given IV Lasix and did diurese and sats now in the high 90s.  He is feeling improved.  I think given heart rate is improved he is not hypoxic and feeling better after Lasix that he can be discharged.  I think primary issue is that he was not taking any medications.  He was not discharged on Lasix but I think that he would benefit from starting Lasix.  Will start 40 mg.  Patient is planned to get his care at the [place] but I we will refer to the heart failure clinic as well in case he is not able to be seen at the [facility] so that he can have close follow-up and he will need a repeat BMP to ensure he is not becoming hypokalemic with the Lasix being added.  I have [facility] all of [facility] as it seems like the issue was that he was not getting his medications from the [place].   FINAL CLINICAL IMPRESSION(S) / ED DIAGNOSES   Final  diagnoses:  Atrial fibrillation, unspecified type [facility])     Rx / DC Orders   ED Discharge Orders          Ordered    atorvastatin (LIPITOR) 80 MG tablet  Daily        04/04/22 1305    ciclesonide (ALVESCO) 160 MCG/ACT inhaler  2 times daily        04/04/22 1305    clopidogrel (PLAVIX) 75 MG tablet  Daily        04/04/22 1305    fluticasone-salmeterol (ADVAIR) 250-50 MCG/ACT AEPB  2 times daily        04/04/22 1305    gabapentin (NEURONTIN) 300 MG capsule  3 times daily        04/04/22 1305    lisinopril (ZESTRIL) 20 MG tablet  Daily        04/04/22 1305    metFORMIN (GLUCOPHAGE) 500 MG tablet  2 times daily with meals        04/04/22 1305    methadone (DOLOPHINE) 5 MG tablet  Daily at bedtime        04/04/22 1305    metoprolol tartrate (LOPRESSOR) 25 MG tablet  2 times daily        04/04/22 1305    nicotine (NICODERM CQ - DOSED IN MG/24 HOURS) 21 mg/24hr patch  Daily        04/04/22 1305    varenicline (CHANTIX) 1 MG tablet  2 times daily        04/04/22 1306    tiotropium (SPIRIVA) 18 MCG inhalation capsule  Daily        04/04/22 1306    furosemide (LASIX) 20 MG tablet  Daily        04/04/22 1306    apixaban (  ELIQUIS) 5 MG TABS tablet  2 times daily        04/04/22 1306    AMB referral to CHF clinic        04/04/22 1308             Note:  This document was prepared using Dragon voice recognition software and may include unintentional dictation errors.   Georga Hacking, MD 04/04/22 1310

## 2022-04-04 NOTE — Discharge Instructions (Addendum)
[facility].  Very important that you take the Eliquis, Plavix and metoprolol.  I have also added Lasix which is a water pill.  It is very important that you follow-up with the [facility] cardiology.  I would like you to have a basic metabolic panel which is a blood test checked in about 1 week to make sure you are potassium is okay we are starting you on Lasix.  Please take the first dose of metoprolol this evening.  Please try to cut back on your alcohol intake is much as possible.

## 2022-04-04 NOTE — ED Triage Notes (Signed)
Pt to ED from home [facility]  Recently diagnosed with a fib (1-2 weeks ago) and has not been able to access prescriptions yet (pharmacy at [facility]). Last night began having SOB. No chest pain.  A fib with rate of 105 on EKG.

## 2022-04-07 NOTE — Progress Notes (Unsigned)
   Patient ID: TORREN MAFFEO, male    DOB: Mar 19, 1956, 66 y.o.   MRN: [id number]  HPI  Mr Guay is a 66 y/o male with a history of  Echo report from 03/21/22 showed an EF of 40-45% along with mild/ moderate LAE, mild/moderate MR, mild/moderate AR, severe AS & myxomatous mitral valve.   Was in the ED 04/04/22 due to AF and HF after not having his meds for ~ 10 days. Admitted 03/20/22 due to midsternal chest pain. Concern for STEMI but then cancelled. Cardiology consult obtained. Mild alcohol withdrawal so started on CIWA. Lasix held. Discharged after 3 days.   He presents today for his initial visit with a chief complaint of   Review of Systems    Physical Exam    Assessment & Plan:  1: Chronic heart failure with mildly reduced ejection fraction- - NYHA class - BNP 04/04/22 was 722.7  2: HTN- - BP - saw PCP @ the [facility] 02/13/22 - BMP 04/04/22 reviewed and showed sodium 139, potassium 4.2, creatinine 0.84 & GFR >60  3: DM- - A1c 03/21/22 was 5.8%  4: Atrial fibrillation-  5: Alcohol use-

## 2022-04-09 ENCOUNTER — Ambulatory Visit: Payer: Medicare PPO | Attending: Family | Admitting: Family

## 2022-04-09 ENCOUNTER — Other Ambulatory Visit
Admission: RE | Admit: 2022-04-09 | Discharge: 2022-04-09 | Disposition: A | Discharge status: Home / Self Care | Payer: Medicare PPO | Source: Ambulatory Visit | Attending: Family | Admitting: Family

## 2022-04-09 ENCOUNTER — Encounter: Payer: Self-pay | Admitting: Family

## 2022-04-09 VITALS — BP 104/86 | HR 70 | Resp 18 | Wt 169.1 lb

## 2022-04-09 DIAGNOSIS — I110 Hypertensive heart disease with heart failure: Secondary | ICD-10-CM | POA: Insufficient documentation

## 2022-04-09 DIAGNOSIS — I480 Paroxysmal atrial fibrillation: Secondary | ICD-10-CM

## 2022-04-09 DIAGNOSIS — Z79899 Other long term (current) drug therapy: Secondary | ICD-10-CM | POA: Diagnosis not present

## 2022-04-09 DIAGNOSIS — E1142 Type 2 diabetes mellitus with diabetic polyneuropathy: Secondary | ICD-10-CM

## 2022-04-09 DIAGNOSIS — F1090 Alcohol use, unspecified, uncomplicated: Secondary | ICD-10-CM | POA: Diagnosis not present

## 2022-04-09 DIAGNOSIS — E119 Type 2 diabetes mellitus without complications: Secondary | ICD-10-CM | POA: Insufficient documentation

## 2022-04-09 DIAGNOSIS — Z7984 Long term (current) use of oral hypoglycemic drugs: Secondary | ICD-10-CM | POA: Insufficient documentation

## 2022-04-09 DIAGNOSIS — Z716 Tobacco abuse counseling: Secondary | ICD-10-CM | POA: Diagnosis not present

## 2022-04-09 DIAGNOSIS — Z7902 Long term (current) use of antithrombotics/antiplatelets: Secondary | ICD-10-CM | POA: Diagnosis not present

## 2022-04-09 DIAGNOSIS — R002 Palpitations: Secondary | ICD-10-CM | POA: Diagnosis not present

## 2022-04-09 DIAGNOSIS — R42 Dizziness and giddiness: Secondary | ICD-10-CM | POA: Insufficient documentation

## 2022-04-09 DIAGNOSIS — R5383 Other fatigue: Secondary | ICD-10-CM | POA: Insufficient documentation

## 2022-04-09 DIAGNOSIS — I5022 Chronic systolic (congestive) heart failure: Secondary | ICD-10-CM

## 2022-04-09 DIAGNOSIS — R079 Chest pain, unspecified: Secondary | ICD-10-CM | POA: Diagnosis not present

## 2022-04-09 DIAGNOSIS — J449 Chronic obstructive pulmonary disease, unspecified: Secondary | ICD-10-CM | POA: Diagnosis not present

## 2022-04-09 DIAGNOSIS — R058 Other specified cough: Secondary | ICD-10-CM | POA: Diagnosis not present

## 2022-04-09 DIAGNOSIS — Z789 Other specified health status: Secondary | ICD-10-CM

## 2022-04-09 DIAGNOSIS — R0602 Shortness of breath: Secondary | ICD-10-CM | POA: Diagnosis not present

## 2022-04-09 DIAGNOSIS — F17210 Nicotine dependence, cigarettes, uncomplicated: Secondary | ICD-10-CM | POA: Diagnosis not present

## 2022-04-09 DIAGNOSIS — I10 Essential (primary) hypertension: Secondary | ICD-10-CM | POA: Diagnosis not present

## 2022-04-09 DIAGNOSIS — F1290 Cannabis use, unspecified, uncomplicated: Secondary | ICD-10-CM | POA: Diagnosis not present

## 2022-04-09 LAB — BASIC METABOLIC PANEL
Anion gap: 9 (ref 5–15)
BUN: 24 mg/dL — ABNORMAL HIGH (ref 8–23)
CO2: 29 mmol/L (ref 22–32)
Calcium: 9.2 mg/dL (ref 8.9–10.3)
Chloride: 96 mmol/L — ABNORMAL LOW (ref 98–111)
Creatinine, Ser: 1.2 mg/dL (ref 0.61–1.24)
GFR, Estimated: >60 mL/min (ref >60)
Glucose, Bld: 108 mg/dL — ABNORMAL HIGH (ref 70–99)
Potassium: 3.6 mmol/L (ref 3.5–5.1)
Sodium: 134 mmol/L — ABNORMAL LOW (ref 135–145)

## 2022-04-09 NOTE — Patient Instructions (Addendum)
Begin weighing daily and call for an overnight weight gain of 3 pounds or more or a weekly weight gain of more than 5 pounds.   If you have voicemail, please make sure your mailbox is cleaned out so that we may leave a message and please make sure to listen to any voicemails.    Continue to decrease your alcohol and tobacco use   Bring ALL medications including any over the counter or vitamins to every visit.

## 2022-04-19 ENCOUNTER — Inpatient Hospital Stay
Admission: EM | Admit: 2022-04-19 | Discharge: 2022-04-20 | DRG: 309 | Disposition: A | Discharge status: Home / Self Care | Payer: No Typology Code available for payment source | Attending: Internal Medicine | Admitting: Internal Medicine

## 2022-04-19 ENCOUNTER — Emergency Department: Payer: No Typology Code available for payment source

## 2022-04-19 DIAGNOSIS — I480 Paroxysmal atrial fibrillation: Principal | ICD-10-CM

## 2022-04-19 DIAGNOSIS — I10 Essential (primary) hypertension: Secondary | ICD-10-CM | POA: Diagnosis not present

## 2022-04-19 DIAGNOSIS — F32A Depression, unspecified: Secondary | ICD-10-CM | POA: Diagnosis present

## 2022-04-19 DIAGNOSIS — J4520 Mild intermittent asthma, uncomplicated: Secondary | ICD-10-CM | POA: Diagnosis not present

## 2022-04-19 DIAGNOSIS — F17210 Nicotine dependence, cigarettes, uncomplicated: Secondary | ICD-10-CM | POA: Diagnosis present

## 2022-04-19 DIAGNOSIS — J4489 Other specified chronic obstructive pulmonary disease: Secondary | ICD-10-CM | POA: Diagnosis present

## 2022-04-19 DIAGNOSIS — Z91148 Patient's other noncompliance with medication regimen for other reason: Secondary | ICD-10-CM | POA: Diagnosis not present

## 2022-04-19 DIAGNOSIS — Z880 Allergy status to penicillin: Secondary | ICD-10-CM

## 2022-04-19 DIAGNOSIS — Z7902 Long term (current) use of antithrombotics/antiplatelets: Secondary | ICD-10-CM | POA: Diagnosis not present

## 2022-04-19 DIAGNOSIS — Q231 Congenital insufficiency of aortic valve: Secondary | ICD-10-CM | POA: Diagnosis not present

## 2022-04-19 DIAGNOSIS — Z89429 Acquired absence of other toe(s), unspecified side: Secondary | ICD-10-CM

## 2022-04-19 DIAGNOSIS — Z7984 Long term (current) use of oral hypoglycemic drugs: Secondary | ICD-10-CM

## 2022-04-19 DIAGNOSIS — E1143 Type 2 diabetes mellitus with diabetic autonomic (poly)neuropathy: Secondary | ICD-10-CM | POA: Diagnosis present

## 2022-04-19 DIAGNOSIS — I4892 Unspecified atrial flutter: Secondary | ICD-10-CM | POA: Diagnosis present

## 2022-04-19 DIAGNOSIS — I4891 Unspecified atrial fibrillation: Principal | ICD-10-CM

## 2022-04-19 DIAGNOSIS — Z888 Allergy status to other drugs, medicaments and biological substances status: Secondary | ICD-10-CM

## 2022-04-19 DIAGNOSIS — I110 Hypertensive heart disease with heart failure: Secondary | ICD-10-CM | POA: Diagnosis present

## 2022-04-19 DIAGNOSIS — G894 Chronic pain syndrome: Secondary | ICD-10-CM | POA: Diagnosis present

## 2022-04-19 DIAGNOSIS — G8929 Other chronic pain: Secondary | ICD-10-CM | POA: Insufficient documentation

## 2022-04-19 DIAGNOSIS — Z7901 Long term (current) use of anticoagulants: Secondary | ICD-10-CM

## 2022-04-19 DIAGNOSIS — E785 Hyperlipidemia, unspecified: Secondary | ICD-10-CM | POA: Diagnosis present

## 2022-04-19 DIAGNOSIS — J45909 Unspecified asthma, uncomplicated: Secondary | ICD-10-CM | POA: Insufficient documentation

## 2022-04-19 DIAGNOSIS — I5043 Acute on chronic combined systolic (congestive) and diastolic (congestive) heart failure: Secondary | ICD-10-CM

## 2022-04-19 DIAGNOSIS — Z79899 Other long term (current) drug therapy: Secondary | ICD-10-CM | POA: Diagnosis not present

## 2022-04-19 DIAGNOSIS — I5023 Acute on chronic systolic (congestive) heart failure: Secondary | ICD-10-CM

## 2022-04-19 DIAGNOSIS — R079 Chest pain, unspecified: Secondary | ICD-10-CM | POA: Diagnosis present

## 2022-04-19 LAB — BASIC METABOLIC PANEL
Anion gap: 11 (ref 5–15)
BUN: 11 mg/dL (ref 8–23)
CO2: 26 mmol/L (ref 22–32)
Calcium: 9.2 mg/dL (ref 8.9–10.3)
Chloride: 102 mmol/L (ref 98–111)
Creatinine, Ser: 0.96 mg/dL (ref 0.61–1.24)
GFR, Estimated: >60 mL/min (ref >60)
Glucose, Bld: 112 mg/dL — ABNORMAL HIGH (ref 70–99)
Potassium: 3.8 mmol/L (ref 3.5–5.1)
Sodium: 139 mmol/L (ref 135–145)

## 2022-04-19 LAB — CBC WITH DIFFERENTIAL/PLATELET
Abs Immature Granulocytes: 0.02 10*3/uL (ref 0.00–0.07)
Basophils Absolute: 0 10*3/uL (ref 0.0–0.1)
Basophils Relative: 1 %
Eosinophils Absolute: 0.2 10*3/uL (ref 0.0–0.5)
Eosinophils Relative: 2 %
HCT: 37.7 % — ABNORMAL LOW (ref 39.0–52.0)
Hemoglobin: 11.7 g/dL — ABNORMAL LOW (ref 13.0–17.0)
Immature Granulocytes: 0 %
Lymphocytes Relative: 38 %
Lymphs Abs: 2.5 10*3/uL (ref 0.7–4.0)
MCH: 26.7 pg (ref 26.0–34.0)
MCHC: 31 g/dL (ref 30.0–36.0)
MCV: 85.9 fL (ref 80.0–100.0)
Monocytes Absolute: 0.7 10*3/uL (ref 0.1–1.0)
Monocytes Relative: 10 %
Neutro Abs: 3.3 10*3/uL (ref 1.7–7.7)
Neutrophils Relative %: 49 %
Platelets: 299 10*3/uL (ref 150–400)
RBC: 4.39 MIL/uL (ref 4.22–5.81)
RDW: 15.9 % — ABNORMAL HIGH (ref 11.5–15.5)
WBC: 6.7 10*3/uL (ref 4.0–10.5)
nRBC: 0 % (ref 0.0–0.2)

## 2022-04-19 LAB — CBC
HCT: 37.9 % — ABNORMAL LOW (ref 39.0–52.0)
Hemoglobin: 11.9 g/dL — ABNORMAL LOW (ref 13.0–17.0)
MCH: 26.8 pg (ref 26.0–34.0)
MCHC: 31.4 g/dL (ref 30.0–36.0)
MCV: 85.4 fL (ref 80.0–100.0)
Platelets: 289 10*3/uL (ref 150–400)
RBC: 4.44 MIL/uL (ref 4.22–5.81)
RDW: 15.9 % — ABNORMAL HIGH (ref 11.5–15.5)
WBC: 5.7 10*3/uL (ref 4.0–10.5)
nRBC: 0 % (ref 0.0–0.2)

## 2022-04-19 LAB — COMPREHENSIVE METABOLIC PANEL
ALT: 29 U/L (ref 0–44)
AST: 29 U/L (ref 15–41)
Albumin: 3.8 g/dL (ref 3.5–5.0)
Alkaline Phosphatase: 64 U/L (ref 38–126)
Anion gap: 8 (ref 5–15)
BUN: 12 mg/dL (ref 8–23)
CO2: 26 mmol/L (ref 22–32)
Calcium: 9 mg/dL (ref 8.9–10.3)
Chloride: 105 mmol/L (ref 98–111)
Creatinine, Ser: 1.04 mg/dL (ref 0.61–1.24)
GFR, Estimated: >60 mL/min (ref >60)
Glucose, Bld: 107 mg/dL — ABNORMAL HIGH (ref 70–99)
Potassium: 4.2 mmol/L (ref 3.5–5.1)
Sodium: 139 mmol/L (ref 135–145)
Total Bilirubin: 1.1 mg/dL (ref 0.3–1.2)
Total Protein: 7.1 g/dL (ref 6.5–8.1)

## 2022-04-19 LAB — CBG MONITORING, ED
Glucose-Capillary: 107 mg/dL — ABNORMAL HIGH (ref 70–99)
Glucose-Capillary: 127 mg/dL — ABNORMAL HIGH (ref 70–99)
Glucose-Capillary: 97 mg/dL (ref 70–99)
Glucose-Capillary: 88 mg/dL (ref 70–99)

## 2022-04-19 LAB — TROPONIN I (HIGH SENSITIVITY)
Troponin I (High Sensitivity): 51 ng/L — ABNORMAL HIGH (ref <18)
Troponin I (High Sensitivity): 52 ng/L — ABNORMAL HIGH (ref <18)

## 2022-04-19 LAB — ETHANOL: Alcohol, Ethyl (B): <10 mg/dL (ref <10)

## 2022-04-19 LAB — TSH: TSH: 1.545 u[IU]/mL (ref 0.350–4.500)

## 2022-04-19 LAB — MAGNESIUM: Magnesium: 2.1 mg/dL (ref 1.7–2.4)

## 2022-04-19 MED ORDER — ACETAMINOPHEN 325 MG PO TABS
650.0000 mg | ORAL_TABLET | Freq: Four times a day (QID) | ORAL | Status: DC | PRN
  Administered 2022-04-19: 650 mg via ORAL
  Filled 2022-04-19: qty 2

## 2022-04-19 MED ORDER — ONDANSETRON HCL 4 MG PO TABS: 4.0000 mg | ORAL_TABLET | Freq: Four times a day (QID) | ORAL | Status: DC | PRN

## 2022-04-19 MED ORDER — OXYCODONE HCL 5 MG PO TABS
5.0000 mg | ORAL_TABLET | ORAL | Status: DC | PRN
  Administered 2022-04-19 (×2): 10 mg via ORAL
  Administered 2022-04-20: 5 mg via ORAL
  Filled 2022-04-19: qty 2
  Filled 2022-04-19: qty 1
  Filled 2022-04-19: qty 2

## 2022-04-19 MED ORDER — METOPROLOL TARTRATE 50 MG PO TABS
50.0000 mg | ORAL_TABLET | Freq: Two times a day (BID) | ORAL | Status: DC
  Administered 2022-04-19 – 2022-04-20 (×3): 50 mg via ORAL
  Filled 2022-04-19 (×3): qty 1

## 2022-04-19 MED ORDER — THIAMINE MONONITRATE 100 MG PO TABS
100.0000 mg | ORAL_TABLET | Freq: Every day | ORAL | Status: DC
  Administered 2022-04-19 – 2022-04-20 (×2): 100 mg via ORAL
  Filled 2022-04-19 (×2): qty 1

## 2022-04-19 MED ORDER — DILTIAZEM HCL 25 MG/5ML IV SOLN
10.0000 mg | Freq: Once | INTRAVENOUS | Status: AC
  Administered 2022-04-19: 10 mg via INTRAVENOUS
  Filled 2022-04-19: qty 5

## 2022-04-19 MED ORDER — INSULIN ASPART 100 UNIT/ML IJ SOLN
0.0000 [IU] | Freq: Three times a day (TID) | INTRAMUSCULAR | Status: DC
  Administered 2022-04-19: 1 [IU] via SUBCUTANEOUS
  Administered 2022-04-20: 2 [IU] via SUBCUTANEOUS
  Filled 2022-04-19 (×2): qty 1

## 2022-04-19 MED ORDER — ACETAMINOPHEN 325 MG RE SUPP: 650.0000 mg | Freq: Four times a day (QID) | RECTAL | Status: DC | PRN

## 2022-04-19 MED ORDER — LISINOPRIL 10 MG PO TABS
10.0000 mg | ORAL_TABLET | Freq: Every day | ORAL | Status: DC
  Administered 2022-04-19: 10 mg via ORAL
  Filled 2022-04-19: qty 1

## 2022-04-19 MED ORDER — NICOTINE 21 MG/24HR TD PT24
21.0000 mg | MEDICATED_PATCH | Freq: Every day | TRANSDERMAL | Status: DC
  Administered 2022-04-19 – 2022-04-20 (×2): 21 mg via TRANSDERMAL
  Filled 2022-04-19 (×2): qty 1

## 2022-04-19 MED ORDER — ONDANSETRON HCL 4 MG/2ML IJ SOLN: 4.0000 mg | Freq: Four times a day (QID) | INTRAMUSCULAR | Status: DC | PRN

## 2022-04-19 MED ORDER — INSULIN ASPART 100 UNIT/ML IJ SOLN: 0.0000 [IU] | Freq: Every day | INTRAMUSCULAR | Status: DC

## 2022-04-19 MED ORDER — ATORVASTATIN CALCIUM 20 MG PO TABS
80.0000 mg | ORAL_TABLET | Freq: Every day | ORAL | Status: DC
  Administered 2022-04-19 – 2022-04-20 (×2): 80 mg via ORAL
  Filled 2022-04-19 (×2): qty 4

## 2022-04-19 MED ORDER — LORAZEPAM 1 MG PO TABS: 1.0000 mg | ORAL_TABLET | ORAL | Status: DC | PRN

## 2022-04-19 MED ORDER — FOLIC ACID 1 MG PO TABS
1.0000 mg | ORAL_TABLET | Freq: Every day | ORAL | Status: DC
  Administered 2022-04-19 – 2022-04-20 (×2): 1 mg via ORAL
  Filled 2022-04-19 (×2): qty 1

## 2022-04-19 MED ORDER — GABAPENTIN 300 MG PO CAPS
300.0000 mg | ORAL_CAPSULE | Freq: Three times a day (TID) | ORAL | Status: DC
  Administered 2022-04-19 – 2022-04-20 (×4): 300 mg via ORAL
  Filled 2022-04-19 (×4): qty 1

## 2022-04-19 MED ORDER — OXYCODONE HCL 5 MG PO TABS: 5.0000 mg | ORAL_TABLET | ORAL | Status: DC | PRN

## 2022-04-19 MED ORDER — LORAZEPAM 2 MG/ML IJ SOLN: 1.0000 mg | INTRAMUSCULAR | Status: DC | PRN

## 2022-04-19 MED ORDER — CLOPIDOGREL BISULFATE 75 MG PO TABS
75.0000 mg | ORAL_TABLET | Freq: Every day | ORAL | Status: DC
  Administered 2022-04-19 – 2022-04-20 (×2): 75 mg via ORAL
  Filled 2022-04-19 (×2): qty 1

## 2022-04-19 MED ORDER — METOPROLOL TARTRATE 25 MG PO TABS: 25.0000 mg | ORAL_TABLET | Freq: Two times a day (BID) | ORAL | Status: DC

## 2022-04-19 MED ORDER — MAGNESIUM HYDROXIDE 400 MG/5ML PO SUSP: 30.0000 mL | Freq: Every day | ORAL | Status: DC | PRN

## 2022-04-19 MED ORDER — FUROSEMIDE 10 MG/ML IJ SOLN
40.0000 mg | Freq: Once | INTRAMUSCULAR | Status: AC
  Administered 2022-04-19: 40 mg via INTRAVENOUS
  Filled 2022-04-19: qty 4

## 2022-04-19 MED ORDER — TRAZODONE HCL 50 MG PO TABS: 25.0000 mg | ORAL_TABLET | Freq: Every evening | ORAL | Status: DC | PRN

## 2022-04-19 MED ORDER — METHADONE HCL 10 MG PO TABS
5.0000 mg | ORAL_TABLET | Freq: Every day | ORAL | Status: DC
  Administered 2022-04-19: 5 mg via ORAL
  Filled 2022-04-19: qty 1

## 2022-04-19 MED ORDER — DILTIAZEM HCL-DEXTROSE 125-5 MG/125ML-% IV SOLN (PREMIX)
5.0000 mg/h | INTRAVENOUS | Status: DC
  Administered 2022-04-19: 5 mg/h via INTRAVENOUS
  Filled 2022-04-19: qty 125

## 2022-04-19 MED ORDER — FUROSEMIDE 10 MG/ML IJ SOLN
40.0000 mg | Freq: Two times a day (BID) | INTRAMUSCULAR | Status: DC
  Administered 2022-04-19 (×2): 40 mg via INTRAVENOUS
  Filled 2022-04-19 (×2): qty 4

## 2022-04-19 MED ORDER — THIAMINE HCL 100 MG/ML IJ SOLN: 100.0000 mg | Freq: Every day | INTRAMUSCULAR | Status: DC

## 2022-04-19 MED ORDER — APIXABAN 5 MG PO TABS
5.0000 mg | ORAL_TABLET | Freq: Two times a day (BID) | ORAL | Status: DC
  Administered 2022-04-19 – 2022-04-20 (×3): 5 mg via ORAL
  Filled 2022-04-19 (×3): qty 1

## 2022-04-19 NOTE — Assessment & Plan Note (Signed)
-   We will continue methadone.

## 2022-04-19 NOTE — Assessment & Plan Note (Addendum)
-   The patient will be placed on diuresis with IV Lasix. - We will follow the troponins. - Cardiology consult will be obtained as mentioned above. - Strict I's and O's and daily weights. - 2D echo on 03/21/2022 revealed an EF of 40 to 45%, grade 1 diastolic dysfunction, mild to moderate LAD, mild RAD, mild to moderate mitral valve regurgitation and aortic valve regurgitation.

## 2022-04-19 NOTE — H&P (Signed)
[place]   PATIENT NAME: Jason Beck    MR#:  [id number]  DATE OF BIRTH:  June 25, 1955  DATE OF ADMISSION:  04/19/2022  PRIMARY CARE PHYSICIAN: [facility] [place]   Patient is coming from: Home  REQUESTING/REFERRING PHYSICIAN: Blake Divine, MD  CHIEF COMPLAINT:   Chief Complaint  Patient presents with   Chest Pain    HISTORY OF PRESENT ILLNESS:  Jason Beck is a 66 y.o. Caucasian male with medical history significant for asthma, atrial fibrillation, CHF, COPD, type II diabetes mellitus, hypertension and depression, who presented to the ER with acute onset of chest pain that woke him up from sleep with a feeling of tightness and tingling in the chest and radiation to his left arm without diaphoresis.  He had associated dyspnea and has been having dry cough and chest congestion over the last couple days.  He had associated wheezing and has been using her albuterol more frequently.  He admits to two-pillow orthopnea, dyspnea on exertion and paroxysmal nocturnal dyspnea without lower extremity edema.  Upon arrival of EMS he was noted to be in atrial fibrillation with RVR.  He has been compliant with her medications..  No fever or chills.  No nausea or vomiting or abdominal pain.  No dysuria, oliguria or hematuria or flank pain.  ED Course: When he came to the ER, BP was 123/93 with heart rate of 151.  CMP was unremarkable.  CBC with anemia close to baseline.  EKG as reviewed by me :  EKG showed atrial fibrillation/flutter with RVR of 119 with right axis deviation and prolonged QT interval with QTc of 514 MS. Imaging: Chest x-ray showed stable cardiomegaly with additional findings consistent with mild interstitial edema and mild bibasilar atelectasis.  The patient was given 10 mg of IV Cardizem bolus followed by IV Cardizem drip and 40 mg of IV Lasix.  He will be admitted to a progressive unit bed for further evaluation and management. PAST MEDICAL HISTORY:   Past  Medical History:  Diagnosis Date   Asthma    Atrial fibrillation (HCC)    Bicuspid aortic valve    CHF (congestive heart failure) (HCC)    COPD (chronic obstructive pulmonary disease) (HCC)    Depression    Diabetes mellitus without complication ([place])    Hypertension     PAST SURGICAL HISTORY:   Past Surgical History:  Procedure Laterality Date   toe amputations      SOCIAL HISTORY:   Social History   Tobacco Use   Smoking status: Every Day    Packs/day: 1.50    Types: Cigarettes   Smokeless tobacco: Never  Substance Use Topics   Alcohol use: Yes    Alcohol/week: 2.0 - 3.0 standard drinks of alcohol    Types: 2 - 3 Cans of beer per week    Comment: daily    FAMILY HISTORY:  No family history on file.  DRUG ALLERGIES:   Allergies  Allergen Reactions   Niacin Other (See Comments)   Penicillin G     REVIEW OF SYSTEMS:   ROS As per history of present illness. All pertinent systems were reviewed above. Constitutional, HEENT, cardiovascular, respiratory, GI, GU, musculoskeletal, neuro, psychiatric, endocrine, integumentary and hematologic systems were reviewed and are otherwise negative/unremarkable except for positive findings mentioned above in the HPI.   MEDICATIONS AT HOME:   Prior to Admission medications   Medication Sig Start Date End Date Taking? Authorizing Provider  apixaban Arne Cleveland)  5 MG TABS tablet Take 1 tablet (5 mg total) by mouth 2 (two) times daily. 04/04/22 05/04/22 Yes Rada Hay, MD  atorvastatin (LIPITOR) 80 MG tablet Take 1 tablet (80 mg total) by mouth daily. 04/04/22 03/30/23 Yes Rada Hay, MD  ciclesonide (ALVESCO) 160 MCG/ACT inhaler Inhale 2 puffs into the lungs 2 (two) times daily. 04/04/22 05/04/22 Yes Rada Hay, MD  clopidogrel (PLAVIX) 75 MG tablet Take 1 tablet (75 mg total) by mouth daily. 04/04/22  Yes Rada Hay, MD  fluticasone-salmeterol (ADVAIR) 250-50 MCG/ACT AEPB Inhale 1 puff into the lungs  in the morning and at bedtime. 04/04/22  Yes Rada Hay, MD  furosemide (LASIX) 20 MG tablet Take 2 tablets (40 mg total) by mouth daily. Patient taking differently: Take 20 mg by mouth daily. 04/04/22 10/01/22 Yes Rada Hay, MD  gabapentin (NEURONTIN) 300 MG capsule Take 1 capsule (300 mg total) by mouth 3 (three) times daily. 04/04/22 05/04/22 Yes Rada Hay, MD  lisinopril (ZESTRIL) 20 MG tablet Take 0.5 tablets (10 mg total) by mouth daily. 04/04/22  Yes Rada Hay, MD  metFORMIN (GLUCOPHAGE) 500 MG tablet Take 1 tablet (500 mg total) by mouth 2 (two) times daily with a meal. 04/04/22 05/04/22 Yes Rada Hay, MD  methadone (DOLOPHINE) 5 MG tablet Take 1 tablet (5 mg total) by mouth at bedtime. 04/04/22  Yes Rada Hay, MD  metoprolol tartrate (LOPRESSOR) 25 MG tablet Take 1 tablet (25 mg total) by mouth 2 (two) times daily. 04/04/22 05/04/22 Yes Rada Hay, MD  tiotropium (SPIRIVA) 18 MCG inhalation capsule Place 1 capsule (18 mcg total) into inhaler and inhale daily. 04/04/22 06/03/22 Yes Rada Hay, MD  oxycodone (OXY-IR) 5 MG capsule Take 5 mg by mouth every 4 (four) hours as needed.    [provider]      VITAL SIGNS:  Blood pressure 128/80, pulse 73, temperature 97.8 F (36.6 C), temperature source Oral, resp. rate 16, height 5\' 10"  (1.778 m), weight 74.4 kg, SpO2 96 %.  PHYSICAL EXAMINATION:  Physical Exam  GENERAL:  66 y.o.-year-old Caucasian male patient lying in the bed with no acute distress.  EYES: Pupils equal, round, reactive to light and accommodation. No scleral icterus. Extraocular muscles intact.  HEENT: Head atraumatic, normocephalic. Oropharynx and nasopharynx clear.  NECK:  Supple, no jugular venous distention. No thyroid enlargement, no tenderness.  LUNGS: Slightly diminished bibasilar breath sounds with bibasilar rales.. No use of accessory muscles of respiration.  CARDIOVASCULAR: Irregularly  irregular rhythm, S1, S2 normal. No murmurs, rubs, or gallops.  ABDOMEN: Soft, nondistended, nontender. Bowel sounds present. No organomegaly or mass.  EXTREMITIES: No pedal edema, cyanosis, or clubbing.  NEUROLOGIC: Cranial nerves II through XII are intact. Muscle strength 5/5 in all extremities. Sensation intact. Gait not checked.  PSYCHIATRIC: The patient is alert and oriented x 3.  Normal affect and good eye contact. SKIN: No obvious rash, lesion, or ulcer.   LABORATORY PANEL:   CBC Recent Labs  Lab 04/19/22 0352  WBC 5.7  HGB 11.9*  HCT 37.9*  PLT 289   ------------------------------------------------------------------------------------------------------------------  Chemistries  Recent Labs  Lab 04/19/22 0105 04/19/22 0352  NA 139 139  K 4.2 3.8  CL 105 102  CO2 26 26  GLUCOSE 107* 112*  BUN 12 11  CREATININE 1.04 0.96  CALCIUM 9.0 9.2  MG 2.1  --   AST 29  --   ALT 29  --   ALKPHOS  64  --   BILITOT 1.1  --    ------------------------------------------------------------------------------------------------------------------  Cardiac Enzymes No results for input(s): "TROPONINI" in the last 168 hours. ------------------------------------------------------------------------------------------------------------------  RADIOLOGY:  DG Chest Portable 1 View  Result Date: 04/19/2022 CLINICAL DATA:  Chest pain and shortness of breath. EXAM: PORTABLE CHEST 1 VIEW COMPARISON:  April 04, 2022 FINDINGS: The cardiac silhouette is mildly enlarged and unchanged in size. There is marked severity calcification of the aortic arch. Mild, diffusely increased interstitial lung markings are seen. Mild atelectatic changes are also noted within the bilateral lung bases. There is no evidence of a pleural effusion or pneumothorax. Multilevel degenerative changes seen throughout the thoracic spine. IMPRESSION: 1. Stable cardiomegaly with additional findings consistent with mild  interstitial edema. 2. Mild bibasilar atelectasis. Electronically Signed   By: Virgina Norfolk M.D.   On: 04/19/2022 01:41      IMPRESSION AND PLAN:  Assessment and Plan: * Paroxysmal atrial fibrillation with RVR ([place]) - The patient has atrial flutter/fibrillation with RVR. - The patient will be admitted to a progressive unit bed. - We will continue him on IV Cardizem drip. - We will continue Eliquis. - We will follow serial troponins. - Cardiology consult to be obtained. - I notified Dr. Saralyn Pilar about the patient.  Acute on chronic combined systolic and diastolic CHF (congestive heart failure) ([place]) - The patient will be placed on diuresis with IV Lasix. - We will follow the troponins. - Cardiology consult will be obtained as mentioned above. - Strict I's and O's and daily weights. - 2D echo on 03/21/2022 revealed an EF of 40 to [id number], grade 1 diastolic dysfunction, mild to moderate LAD, mild RAD, mild to moderate mitral valve regurgitation and aortic valve regurgitation.  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 (HCC) - The patient will be placed on supplement coverage with NovoLog - We will hold off metformin. - We will continue Neurontin.  Dyslipidemia - We will continue statin therapy.  Chronic pain - We will continue methadone.  Asthma, chronic - We will hold off long-acting beta agonist given his acute CHF.  Essential hypertension - We will continue his antihypertensives.    DVT prophylaxis: Lovenox.  Advanced Care Planning:  Code Status: full code.  Family Communication:  The plan of care was discussed in details with the patient (and family). I answered all questions. The patient agreed to proceed with the above mentioned plan. Further management will depend upon hospital course. Disposition Plan: Back to previous home environment Consults called: none.  All the records are reviewed and case discussed with ED provider.  Status is:  Inpatient   At the time of the admission, it appears that the appropriate admission status for this patient is inpatient.  This is judged to be reasonable and necessary in order to provide the required intensity of service to ensure the patient's safety given the presenting symptoms, physical exam findings and initial radiographic and laboratory data in the context of comorbid conditions.  The patient requires inpatient status due to high intensity of service, high risk of further deterioration and high frequency of surveillance required.  I certify that at the time of admission, it is my clinical judgment that the patient will require inpatient hospital care extending more than 2 midnights.                            Dispo: The patient is from: Home  Anticipated d/c is to: Home              Patient currently is not medically stable to d/c.              Difficult to place patient: No  Hannah Beat M.D on 04/19/2022 at 4:32 AM  [facility]   From 7 PM-7 AM, contact night-coverage [email or web address]  CC: Primary care physician; [facility]

## 2022-04-19 NOTE — Assessment & Plan Note (Signed)
-   The patient will be placed on supplement coverage with NovoLog. - We will hold off metformin. - We will continue Neurontin. 

## 2022-04-19 NOTE — Assessment & Plan Note (Signed)
-   We will continue his antihypertensives. 

## 2022-04-19 NOTE — Assessment & Plan Note (Addendum)
-   The patient has atrial flutter/fibrillation with RVR. - The patient will be admitted to a progressive unit bed. - We will continue him on IV Cardizem drip. - We will continue Eliquis. - We will follow serial troponins. - Cardiology consult to be obtained. - I notified Dr. Darrold Junker about the patient.

## 2022-04-19 NOTE — Assessment & Plan Note (Signed)
-   We will continue statin therapy. 

## 2022-04-19 NOTE — Progress Notes (Addendum)
PROGRESS NOTE    Jason Beck  TGG:[id number] DOB: November 02, 1955 DOA: 04/19/2022 PCP: [facility]    Brief Narrative:   Jason Beck is a 66 y.o. male [employer] [place] veteran with past medical history significant for asthma/COPD, chronic combined systolic/diastolic congestive heart failure, persistent atrial fibrillation, type 2 diabetes mellitus, essential hypertension, peripheral neuropathy, tobacco use disorder, EtOH abuse who presented to [facility] ED on 12/21 with chest tightness, shortness of breath.  Patient reports it woke him from sleep and describes pain as a tightness.  Additionally he reports a dry cough and congestion over the last few days.  He has been using his albuterol MDI more frequently.  Denies any sick contacts but does report that he misses doses of his medication and says he becomes confused on when to take them.  Denies headache, no abdominal pain, no urinary symptoms, no fever/chills/night sweats.  In the ED, temperature 97.8 F, HR 151, RR 18, BP 123/93, SpO2 98% on room air.  WBC 6.7, hemoglobin 11.7, platelets 299.  Sodium 139, potassium 4.2, chloride 105, CO2 26, glucose 107, BUN 12, creatinine 1.04.  AST 29, ALT 29, total bilirubin 1.1.  EtOH level less than 10.  Chest x-ray with stable cardiomegaly, with mild interstitial edema, mild bibasilar atelectasis.  EKG with atrial fibrillation/flutter with RVR with rate 119, right axis deviation, prolonged QTc of 514 ms.  Patient was given Cardizem 10 mg IV bolus followed by IV Cardizem drip and 40 mg IV Lasix.  TRH consulted for further evaluation and management of A-fib with RVR and mild congestive heart failure exacerbation.  Assessment & Plan:   Persistent atrial fibrillation with RVR Patient presenting with chest tightness, shortness of breath was found to be in A-fib with RVR with rates up to 151 on ED presentation.  Recently diagnosed and was prescribed metoprolol 25 mg p.o. twice daily, but reports  intermittent use.  Etiology likely secondary to medication noncompliance.  Was initially placed on Cardizem drip and now titrated off.  TTE 03/21/2022 with LVEF 40-45%, grade 1 diastolic dysfunction, biatrial enlargement, moderate MR, moderate AR.  TSH within normal limits. -- Metoprolol tartrate 50 mg p.o. twice daily -- Titrate off Cardizem today -- Eliquis 5 mg p.o. twice daily -- Continue monitor on telemetry -- Will likely benefit from home health RN on discharge to monitor his medications outpatient  Acute on chronic combined systolic/diastolic congestive heart failure Chest x-ray on admission with mild vascular congestion.  Patient reports intermittent use of his home medications. -- Furosemide 40 mg IV every 12 hours -- Strict I's and O's and daily weights -- BMP daily  Essential hypertension -- Metoprolol tartrate 50 mg p.o. twice daily -- Lisinopril 10 mg p.o. daily  Type 2 diabetes mellitus On metformin 500 mg p.o. twice daily outpatient. -- Hold oral hypoglycemics while inpatient -- Sensitive SSI for coverage -- CBGs before every meal/at bedtime  Peripheral neuropathy -- Gabapentin 300 mg p.o. 3 times daily  Dyslipidemia -- Atorvastatin 80 mg p.o. daily  Chronic pain syndrome --Methadone 5 mg p.o. nightly -- Oxycodone 5-10 mg p.o. every 4 hours as needed moderate/severe pain  Tobacco use disorder Continues to smoke 1 pack/day, down from 1.5 packs/day.  Counseled on need for complete cessation. -- Nicotine patch  EtOH abuse Reports now drinks roughly 4 beers a day, down from a "case" previously.  Denies any significant withdrawal symptoms when he stops drinking.  EtOH level less than 10 on admission. -- CIWA protocol with  symptom triggered Ativan -- Thiamine, folic acid   DVT prophylaxis:  apixaban (ELIQUIS) tablet 5 mg    Code Status: Full Code Family Communication: No family present at bedside this morning  Disposition Plan:  Level of care:  Progressive Status is: Inpatient Remains inpatient appropriate because: Tight trading off Cardizem drip, anticipate discharge home in 1-2 days    Consultants:  None  Procedures:  None  Antimicrobials:  None   Subjective: Patient seen examined at bedside, resting comfortably.  Remains in ED holding area.  Chest tightness and shortness of breath resolved.  Remains on Cardizem drip at 5 mg/h.  Heart rate currently controlled.  Discussed with patient's that needs to comply with his medications as prescribed in order to prevent recurrent hospitalizations.  Patient reports that he just becomes confused on when to take his medications.  No other specific complaints or concerns at this time.  Denies headache, no dizziness, no chest pain, no palpitations, no shortness of breath, no abdominal pain, no focal weakness, no fever/chills/night sweats, no nausea/vomiting/diarrhea, no paresthesias.  No acute events overnight per nursing staff.  Objective: Vitals:   04/19/22 0830 04/19/22 0900 04/19/22 0904 04/19/22 1230  BP: 131/70 (!) 145/72 (!) 142/72 102/68  Pulse: 73 74 74 75  Resp: 17 16  14   Temp:      TempSrc:      SpO2: 95% 97%  95%  Weight:      Height:        Intake/Output Summary (Last 24 hours) at 04/19/2022 1320 Last data filed at 04/19/2022 0731 Gross per 24 hour  Intake --  Output 680 ml  Net -680 ml   Filed Weights   04/19/22 0102  Weight: 74.4 kg    Examination:  Physical Exam: GEN: NAD, alert and oriented x 3, chronically ill/disheveled in appearance, appears older than stated age HEENT: NCAT, PERRL, EOMI, sclera clear, MMM PULM: CTAB w/o wheezes/crackles, normal respiratory effort, on room air CV: Irregularly irregular rhythm, normal rate w/o M/G/R GI: abd soft, NTND, NABS, no R/G/M MSK: no peripheral edema, muscle strength globally intact 5/5 bilateral upper/lower extremities NEURO: CN II-XII intact, no focal deficits, sensation to light touch intact PSYCH:  normal mood/affect Integumentary: Chronic venous changes bilateral lower extremities, otherwise no other concerning rashes/lesions/wounds noted on exposed skin surfaces.      Data Reviewed: I have personally reviewed following labs and imaging studies  CBC: Recent Labs  Lab 04/19/22 0105 04/19/22 0352  WBC 6.7 5.7  NEUTROABS 3.3  --   HGB 11.7* 11.9*  HCT 37.7* 37.9*  MCV 85.9 85.4  PLT 299 289   Basic Metabolic Panel: Recent Labs  Lab 04/19/22 0105 04/19/22 0352  NA 139 139  K 4.2 3.8  CL 105 102  CO2 26 26  GLUCOSE 107* 112*  BUN 12 11  CREATININE 1.04 0.96  CALCIUM 9.0 9.2  MG 2.1  --    GFR: Estimated Creatinine Clearance: 77.1 mL/min (by C-G formula based on SCr of 0.96 mg/dL). Liver Function Tests: Recent Labs  Lab 04/19/22 0105  AST 29  ALT 29  ALKPHOS 64  BILITOT 1.1  PROT 7.1  ALBUMIN 3.8   No results for input(s): "LIPASE", "AMYLASE" in the last 168 hours. No results for input(s): "AMMONIA" in the last 168 hours. Coagulation Profile: No results for input(s): "INR", "PROTIME" in the last 168 hours. Cardiac Enzymes: No results for input(s): "CKTOTAL", "CKMB", "CKMBINDEX", "TROPONINI" in the last 168 hours. BNP (last 3 results) No results  for input(s): "PROBNP" in the last 8760 hours. HbA1C: No results for input(s): "HGBA1C" in the last 72 hours. CBG: Recent Labs  Lab 04/19/22 0754 04/19/22 1228  GLUCAP 107* 127*   Lipid Profile: No results for input(s): "CHOL", "HDL", "LDLCALC", "TRIG", "CHOLHDL", "LDLDIRECT" in the last 72 hours. Thyroid Function Tests: Recent Labs    04/19/22 0352  TSH 1.545   Anemia Panel: No results for input(s): "VITAMINB12", "FOLATE", "FERRITIN", "TIBC", "IRON", "RETICCTPCT" in the last 72 hours. Sepsis Labs: No results for input(s): "PROCALCITON", "LATICACIDVEN" in the last 168 hours.  No results found for this or any previous visit (from the past 240 hour(s)).       Radiology Studies: DG Chest  Portable 1 View  Result Date: 04/19/2022 CLINICAL DATA:  Chest pain and shortness of breath. EXAM: PORTABLE CHEST 1 VIEW COMPARISON:  April 04, 2022 FINDINGS: The cardiac silhouette is mildly enlarged and unchanged in size. There is marked severity calcification of the aortic arch. Mild, diffusely increased interstitial lung markings are seen. Mild atelectatic changes are also noted within the bilateral lung bases. There is no evidence of a pleural effusion or pneumothorax. Multilevel degenerative changes seen throughout the thoracic spine. IMPRESSION: 1. Stable cardiomegaly with additional findings consistent with mild interstitial edema. 2. Mild bibasilar atelectasis. Electronically Signed   By: Aram Candela M.D.   On: 04/19/2022 01:41        Scheduled Meds:  apixaban  5 mg Oral BID   atorvastatin  80 mg Oral Daily   clopidogrel  75 mg Oral Daily   furosemide  40 mg Intravenous Q12H   gabapentin  300 mg Oral TID   insulin aspart  0-5 Units Subcutaneous QHS   insulin aspart  0-9 Units Subcutaneous TID WC   lisinopril  10 mg Oral Daily   methadone  5 mg Oral QHS   metoprolol tartrate  50 mg Oral BID   nicotine  21 mg Transdermal Daily   Continuous Infusions:  diltiazem (CARDIZEM) infusion Stopped (04/19/22 1229)     LOS: 0 days    Time spent: 52 minutes spent on chart review, discussion with nursing staff, consultants, updating family and interview/physical exam; more than 50% of that time was spent in counseling and/or coordination of care.    Alvira Philips [place], DO [facility] Available via Epic secure chat 7am-7pm After these hours, please refer to coverage provider listed on [email or web address] 04/19/2022, 1:20 PM

## 2022-04-19 NOTE — ED Provider Notes (Signed)
[facility] Provider Note    Event Date/Time   First MD Initiated Contact with Patient 04/19/22 0102     (approximate)   History   Chief Complaint Chest Pain   HPI  Jason Beck is a 66 y.o. male with past medical history of hypertension, diabetes, atrial fibrillation on Eliquis, CHF, and COPD who presents to the ED complaining of chest pain.  Patient reports that he was woken from sleep just prior to arrival with feeling of tightness and tingling in his chest.  This been associated with some difficulty breathing, patient does endorse that he has been coughing for the past couple of days with increased use of his albuterol at home.  EMS was called and found patient to be in atrial fibrillation with RVR.  Patient reports a history of this, states he has been taking all prescribed medications for it but is not sure exactly what he takes as his wife manages his medications.  He has not had any fevers and his cough is nonproductive, he denies any pain or swelling in his legs.     Physical Exam   Triage Vital Signs: ED Triage Vitals  Enc Vitals Group     BP 04/19/22 0059 (!) 123/93     Pulse Rate 04/19/22 0059 (!) 151     Resp 04/19/22 0059 18     Temp 04/19/22 0059 97.8 F (36.6 C)     Temp Source 04/19/22 0059 Oral     SpO2 04/19/22 0059 98 %     Weight 04/19/22 0102 164 lb (74.4 kg)     Height 04/19/22 0102 5\' 10"  (1.778 m)     Head Circumference --      Peak Flow --      Pain Score 04/19/22 0102 1     Pain Loc --      Pain Edu? --      Excl. in GC? --     Most recent vital signs: Vitals:   04/19/22 0130 04/19/22 0200  BP: 117/68 115/67  Pulse: (!) 58 73  Resp: 16 14  Temp:    SpO2: 97% 97%    Constitutional: Alert and oriented. Eyes: Conjunctivae are normal. Head: Atraumatic. Nose: No congestion/rhinnorhea. Mouth/Throat: Mucous membranes are moist.  Cardiovascular: Tachycardic, irregularly irregular rhythm. Grossly normal heart  sounds.  2+ radial pulses bilaterally. Respiratory: Normal respiratory effort.  No retractions. Lungs with expiratory wheezing throughout. Gastrointestinal: Soft and nontender. No distention. Musculoskeletal: No lower extremity tenderness nor edema.  Neurologic:  Normal speech and language. No gross focal neurologic deficits are appreciated.    ED Results / Procedures / Treatments   Labs (all labs ordered are listed, but only abnormal results are displayed) Labs Reviewed  CBC WITH DIFFERENTIAL/PLATELET - Abnormal; Notable for the following components:      Result Value   Hemoglobin 11.7 (*)    HCT 37.7 (*)    RDW 15.9 (*)    All other components within normal limits  COMPREHENSIVE METABOLIC PANEL - Abnormal; Notable for the following components:   Glucose, Bld 107 (*)    All other components within normal limits  TROPONIN I (HIGH SENSITIVITY) - Abnormal; Notable for the following components:   Troponin I (High Sensitivity) 51 (*)    All other components within normal limits  MAGNESIUM  ETHANOL     EKG  ED ECG REPORT I, Chesley Noon, the attending physician, personally viewed and interpreted this ECG.   Date: 04/19/2022  EKG Time: 1:00  Rate: 150  Rhythm: Atrial flutter with 2:1 block  Axis: RAD  Intervals:none  ST&T Change: Lateral ST depressions  RADIOLOGY Chest x-ray reviewed and interpreted by me with interstitial infiltrates consistent with pulmonary edema.  PROCEDURES:  Critical Care performed: Yes, see critical care procedure note(s)  .Critical Care  Performed by: Chesley Noon, MD Authorized by: Chesley Noon, MD   Critical care provider statement:    Critical care time (minutes):  30   Critical care time was exclusive of:  Separately billable procedures and treating other patients and teaching time   Critical care was necessary to treat or prevent imminent or life-threatening deterioration of the following conditions:  Cardiac failure   Critical  care was time spent personally by me on the following activities:  Development of treatment plan with patient or surrogate, discussions with consultants, evaluation of patient's response to treatment, examination of patient, ordering and review of laboratory studies, ordering and review of radiographic studies, ordering and performing treatments and interventions, pulse oximetry, re-evaluation of patient's condition and review of old charts   I assumed direction of critical care for this patient from another provider in my specialty: no     Care discussed with: admitting provider      MEDICATIONS ORDERED IN ED: Medications  diltiazem (CARDIZEM) 125 mg in dextrose 5% 125 mL (1 mg/mL) infusion (5 mg/hr Intravenous New Bag/Given 04/19/22 0114)  furosemide (LASIX) injection 40 mg (has no administration in time range)  diltiazem (CARDIZEM) injection 10 mg (10 mg Intravenous Given 04/19/22 0114)     IMPRESSION / MDM / ASSESSMENT AND PLAN / ED COURSE  I reviewed the triage vital signs and the nursing notes.                              66 y.o. male with past medical history of hypertension, diabetes, atrial fibrillation on Eliquis, CHF, and COPD who presents to the ED complaining of chest pain waking him from sleep with increasing cough and difficulty breathing over the past couple of days.  Patient's presentation is most consistent with acute presentation with potential threat to life or bodily function.  Differential diagnosis includes, but is not limited to, arrhythmia, ACS, PE, pneumonia, COPD exacerbation, electrolyte abnormality, anemia.  Patient nontoxic-appearing and in no acute distress, vital signs remarkable for significant tachycardia with initial heart rates in the 140s to 150s.  Blood pressure remained stable, EKG consistent with atrial flutter with 2-1 block.  Patient seems to vary from atrial flutter to atrial fibrillation with heart rates in the 120s to 130s.  He was given IV  diltiazem bolus and we will also start him on a drip for rate control, it appears he has been compliant with Eliquis.  Plan to screen labs for ACS or electrolyte abnormality contributing to atrial fibrillation, patient also admits to regular alcohol consumption.  Patient also has some wheezing on exam consistent with COPD exacerbation, would avoid beta-blocker for now and may give breathing treatment once heart rate improved.  Heart rate significantly improved on diltiazem drip, currently around 100 with stable blood pressure.  Labs show no significant anemia, leukocytosis, electrolyte abnormality, or AKI.  LFTs are unremarkable, troponin mildly elevated, likely rate related with rate related ST changes seen on EKG.  Chest x-ray does show evidence of pulmonary edema and we will diurese with IV Lasix.  Case discussed with hospitalist for admission.  FINAL CLINICAL IMPRESSION(S) / ED DIAGNOSES   Final diagnoses:  Atrial fibrillation with RVR (HCC)  Acute on chronic systolic congestive heart failure ([place])     Rx / DC Orders   ED Discharge Orders     None        Note:  This document was prepared using Dragon voice recognition software and may include unintentional dictation errors.   Blake Divine, MD 04/19/22 [phone]

## 2022-04-19 NOTE — ED Triage Notes (Signed)
Pt from home for CP.  Pt describes pain as tightness.  Pt reports SOB.  Pain started when waking from sleep.  Recent Dx on 16th of A-Fib.  HR currentlyh 150's.

## 2022-04-19 NOTE — Assessment & Plan Note (Signed)
-   We will hold off long-acting beta agonist given his acute CHF.

## 2022-04-20 DIAGNOSIS — I480 Paroxysmal atrial fibrillation: Secondary | ICD-10-CM | POA: Diagnosis not present

## 2022-04-20 LAB — MAGNESIUM: Magnesium: 2 mg/dL (ref 1.7–2.4)

## 2022-04-20 LAB — BASIC METABOLIC PANEL
Anion gap: 8 (ref 5–15)
BUN: 17 mg/dL (ref 8–23)
CO2: 28 mmol/L (ref 22–32)
Calcium: 8.6 mg/dL — ABNORMAL LOW (ref 8.9–10.3)
Chloride: 103 mmol/L (ref 98–111)
Creatinine, Ser: 1.13 mg/dL (ref 0.61–1.24)
GFR, Estimated: >60 mL/min (ref >60)
Glucose, Bld: 108 mg/dL — ABNORMAL HIGH (ref 70–99)
Potassium: 3.2 mmol/L — ABNORMAL LOW (ref 3.5–5.1)
Sodium: 139 mmol/L (ref 135–145)

## 2022-04-20 LAB — CBG MONITORING, ED: Glucose-Capillary: 195 mg/dL — ABNORMAL HIGH (ref 70–99)

## 2022-04-20 MED ORDER — FUROSEMIDE 40 MG PO TABS
40.0000 mg | ORAL_TABLET | Freq: Every day | ORAL | Status: DC
  Administered 2022-04-20: 40 mg via ORAL
  Filled 2022-04-20: qty 1

## 2022-04-20 MED ORDER — METOPROLOL TARTRATE 50 MG PO TABS: 50.0000 mg | ORAL_TABLET | Freq: Two times a day (BID) | ORAL | 2 refills | Status: DC

## 2022-04-20 MED ORDER — FLUTICASONE FUROATE-VILANTEROL 200-25 MCG/ACT IN AEPB
1.0000 | INHALATION_SPRAY | Freq: Every day | RESPIRATORY_TRACT | Status: DC
  Filled 2022-04-20: qty 28

## 2022-04-20 MED ORDER — LISINOPRIL 5 MG PO TABS
5.0000 mg | ORAL_TABLET | Freq: Every day | ORAL | Status: DC
  Administered 2022-04-20: 5 mg via ORAL
  Filled 2022-04-20: qty 1

## 2022-04-20 MED ORDER — TIOTROPIUM BROMIDE MONOHYDRATE 18 MCG IN CAPS
18.0000 ug | ORAL_CAPSULE | Freq: Every day | RESPIRATORY_TRACT | Status: DC
  Filled 2022-04-20: qty 5

## 2022-04-20 MED ORDER — LISINOPRIL 5 MG PO TABS: 5.0000 mg | ORAL_TABLET | Freq: Every day | ORAL | 2 refills | Status: DC

## 2022-04-20 NOTE — Discharge Instructions (Addendum)
The following are medication changes:  Take two 25mg  tablets (50mg  total) of metoprolol twice daily. Lisinopril changed to 5 mg once daily (previously taking 10mg  daily)  Ensure you are referred to a cardiologist by your PCP through the [facility]

## 2022-04-20 NOTE — Discharge Summary (Signed)
Physician Discharge Summary  Jason Beck HYW:[id number] DOB: 1955/09/28 DOA: 04/19/2022  PCP: [facility]  Admit date: 04/19/2022 Discharge date: 04/20/2022  Admitted From: Home Disposition: Home  Recommendations for Outpatient Follow-up:  Follow up with PCP in 1-2 weeks Metoprolol increased to 50 mg p.o. twice daily Lisinopril decreased to 5 mg p.o. daily Continue to encourage alcohol and tobacco cessation  Home Health: No Equipment/Devices: None  Discharge Condition: Stable CODE STATUS: Full code Diet recommendation: Heart healthy diet  History of present illness:  Jason Beck is a 67 y.o. male [employer] [place] veteran with past medical history significant for asthma/COPD, chronic combined systolic/diastolic congestive heart failure, persistent atrial fibrillation, type 2 diabetes mellitus, essential hypertension, peripheral neuropathy, tobacco use disorder, EtOH abuse who presented to [facility] ED on 12/21 with chest tightness, shortness of breath.  Patient reports it woke him from sleep and describes pain as a tightness.  Additionally he reports a dry cough and congestion over the last few days.  He has been using his albuterol MDI more frequently.  Denies any sick contacts but does report that he misses doses of his medication and says he becomes confused on when to take them.  Denies headache, no abdominal pain, no urinary symptoms, no fever/chills/night sweats.   In the ED, temperature 97.8 F, HR 151, RR 18, BP 123/93, SpO2 98% on room air.  WBC 6.7, hemoglobin 11.7, platelets 299.  Sodium 139, potassium 4.2, chloride 105, CO2 26, glucose 107, BUN 12, creatinine 1.04.  AST 29, ALT 29, total bilirubin 1.1.  EtOH level less than 10.  Chest x-ray with stable cardiomegaly, with mild interstitial edema, mild bibasilar atelectasis.  EKG with atrial fibrillation/flutter with RVR with rate 119, right axis deviation, prolonged QTc of 514 ms.  Patient was given Cardizem 10 mg  IV bolus followed by IV Cardizem drip and 40 mg IV Lasix.  TRH consulted for further evaluation and management of A-fib with RVR and mild congestive heart failure exacerbation.  Hospital course:  Persistent atrial fibrillation with RVR Patient presenting with chest tightness, shortness of breath was found to be in A-fib with RVR with rates up to 151 on ED presentation.  Recently diagnosed and was prescribed metoprolol 25 mg p.o. twice daily, but reports intermittent use.  Etiology likely secondary to medication noncompliance.  Was initially placed on Cardizem drip and now titrated off.  TTE 03/21/2022 with LVEF 48-54%, grade 1 diastolic dysfunction, biatrial enlargement, moderate MR, moderate AR.  TSH within normal limits.  Metoprolol tartrate was increased to 50 mg p.o. twice daily.  Continue Eliquis 5 mg p.o. twice daily.  Outpatient follow-up with PCP.   Acute on chronic combined systolic/diastolic congestive heart failure Chest x-ray on admission with mild vascular congestion.  Patient reports intermittent use of his home medications.  Patient was given IV furosemide with improvement of his respiratory status and good urine output.  Will continue furosemide 40 mg p.o. daily on discharge.   Essential hypertension Metoprolol tartrate 50 mg p.o. twice daily, Lisinopril 5 mg p.o. daily   Type 2 diabetes mellitus On metformin 500 mg p.o. twice daily outpatient.   Peripheral neuropathy Gabapentin 300 mg p.o. 3 times daily   Dyslipidemia Atorvastatin 80 mg p.o. daily   Chronic pain syndrome Methadone 5 mg p.o. nightly, Oxycodone 5-10 mg p.o. every 4 hours as needed moderate/severe pain   Tobacco use disorder Continues to smoke 1 pack/day, down from 1.5 packs/day.  Counseled on need for complete cessation.  EtOH abuse Reports now drinks roughly 4 beers a day, down from a "case" previously.  Denies any significant withdrawal symptoms when he stops drinking.  EtOH level less than 10 on  admission.  Continue to encourage abstinence from alcohol use in the future.  Discharge Diagnoses:  Principal Problem:   Paroxysmal atrial fibrillation with RVR (HCC) Active Problems:   Acute on chronic combined systolic and diastolic CHF (congestive heart failure) (HCC)   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 (HCC)   Dyslipidemia   Essential hypertension   Asthma, chronic   Chronic pain    Discharge Instructions  Discharge Instructions     Call MD for:  difficulty breathing, headache or visual disturbances   Complete by: As directed    Call MD for:  extreme fatigue   Complete by: As directed    Call MD for:  persistant dizziness or light-headedness   Complete by: As directed    Call MD for:  persistant nausea and vomiting   Complete by: As directed    Call MD for:  severe uncontrolled pain   Complete by: As directed    Call MD for:  temperature >100.4   Complete by: As directed    Diet - low sodium heart healthy   Complete by: As directed    Increase activity slowly   Complete by: As directed       Allergies as of 04/20/2022       Reactions   Niacin Other (See Comments)   Penicillin G         Medication List     TAKE these medications    apixaban 5 MG Tabs tablet Commonly known as: ELIQUIS Take 1 tablet (5 mg total) by mouth 2 (two) times daily.   atorvastatin 80 MG tablet Commonly known as: LIPITOR Take 1 tablet (80 mg total) by mouth daily.   ciclesonide 160 MCG/ACT inhaler Commonly known as: ALVESCO Inhale 2 puffs into the lungs 2 (two) times daily.   clopidogrel 75 MG tablet Commonly known as: PLAVIX Take 1 tablet (75 mg total) by mouth daily.   fluticasone-salmeterol 250-50 MCG/ACT Aepb Commonly known as: ADVAIR Inhale 1 puff into the lungs in the morning and at bedtime.   furosemide 20 MG tablet Commonly known as: Lasix Take 2 tablets (40 mg total) by mouth daily. What changed: how much to take   gabapentin 300  MG capsule Commonly known as: NEURONTIN Take 1 capsule (300 mg total) by mouth 3 (three) times daily.   lisinopril 5 MG tablet Commonly known as: ZESTRIL Take 1 tablet (5 mg total) by mouth daily. What changed:  medication strength how much to take   metFORMIN 500 MG tablet Commonly known as: GLUCOPHAGE Take 1 tablet (500 mg total) by mouth 2 (two) times daily with a meal.   methadone 5 MG tablet Commonly known as: DOLOPHINE Take 1 tablet (5 mg total) by mouth at bedtime.   metoprolol tartrate 50 MG tablet Commonly known as: LOPRESSOR Take 1 tablet (50 mg total) by mouth 2 (two) times daily. What changed:  medication strength how much to take   oxycodone 5 MG capsule Commonly known as: OXY-IR Take 5 mg by mouth every 4 (four) hours as needed.   tiotropium 18 MCG inhalation capsule Commonly known as: SPIRIVA Place 1 capsule (18 mcg total) into inhaler and inhale daily.        Follow-up [place]. Schedule an appointment  as soon as possible for a visit in 1 week(s).   Specialty: General Practice Contact information: [address] [place] [address] [phone]                Allergies  Allergen Reactions   Niacin Other (See Comments)   Penicillin G     Consultations: None   Procedures/Studies: DG Chest Portable 1 View  Result Date: 04/19/2022 CLINICAL DATA:  Chest pain and shortness of breath. EXAM: PORTABLE CHEST 1 VIEW COMPARISON:  April 04, 2022 FINDINGS: The cardiac silhouette is mildly enlarged and unchanged in size. There is marked severity calcification of the aortic arch. Mild, diffusely increased interstitial lung markings are seen. Mild atelectatic changes are also noted within the bilateral lung bases. There is no evidence of a pleural effusion or pneumothorax. Multilevel degenerative changes seen throughout the thoracic spine. IMPRESSION: 1. Stable cardiomegaly with additional findings consistent with mild  interstitial edema. 2. Mild bibasilar atelectasis. Electronically Signed   By: Aram Candelahaddeus  Houston M.D.   On: 04/19/2022 01:41   DG Chest 2 View  Result Date: 04/04/2022 CLINICAL DATA:  67 year old male with history of shortness of breath. EXAM: CHEST - 2 VIEW COMPARISON:  Chest x-ray 03/21/2022. FINDINGS: Lung volumes are low. No confluent consolidative airspace disease. Diffuse interstitial prominence and peribronchial cuffing throughout the lungs bilaterally. Small bilateral pleural effusions. Bibasilar opacities favored to reflect areas of subsegmental atelectasis or chronic scarring. No pneumothorax. No evidence of pulmonary edema. Heart size is normal. Upper mediastinal contours are within normal limits. Atherosclerotic calcifications are noted in the thoracic aorta. IMPRESSION: 1. The appearance the chest may suggest bronchitis, however, the possibility of underlying interstitial lung disease should be considered. Outpatient referral to Pulmonology for further clinical evaluation is recommended. Consideration for follow-up high-resolution chest CT is also suggested if clinically appropriate. 2. Aortic atherosclerosis. Electronically Signed   By: Trudie Reedaniel  Entrikin M.D.   On: 04/04/2022 10:36   ECHOCARDIOGRAM COMPLETE  Result Date: 03/21/2022    ECHOCARDIOGRAM REPORT   Patient Name:   Moses MannersDAVID B Lovett Date of Exam: 03/21/2022 Medical Rec #:  [id number]        Height:       70.0 in Accession #:    [id number]       Weight:       170.0 lb Date of Birth:  1955-07-30        BSA:          1.948 m Patient Age:    66 years         BP:           131/97 mmHg Patient Gender: M                HR:           111 bpm. Exam Location:  [facility] Procedure: 2D Echo Indications:     Acute MI I21.9  History:         Patient has no prior history of Echocardiogram examinations.  Sonographer:     Overton Mamikeshia Johnson RDCS Referring Phys:  [id number] JAN A MANSY Diagnosing Phys: Alwyn Peawayne D Callwood MD  Sonographer Comments: Technically  difficult study due to poor echo windows. Image acquisition challenging due to respiratory motion. Challenging study due to both patient's irregular rhythm (a-fib) and coughing. IMPRESSIONS  1. Left ventricular ejection fraction, by estimation, is 40 to 45%. The left ventricle has mild to moderately decreased function. The left ventricle demonstrates global hypokinesis. The left ventricular internal cavity size  was mildly to moderately dilated. Left ventricular diastolic parameters are consistent with Grade I diastolic dysfunction (impaired relaxation).  2. Right ventricular systolic function is low normal. The right ventricular size is mildly enlarged. Mildly increased right ventricular wall thickness.  3. Left atrial size was mild to moderately dilated.  4. Right atrial size was mildly dilated.  5. The mitral valve is myxomatous. Mild to moderate mitral valve regurgitation. Moderate mitral annular calcification.  6. The aortic valve is bicuspid. There is moderate calcification of the aortic valve. There is moderate thickening of the aortic valve. Aortic valve regurgitation is mild to moderate. Severe aortic valve stenosis.  7. Aortic dilatation noted. There is mild dilatation of the aortic root, measuring 32 mm. FINDINGS  Left Ventricle: Left ventricular ejection fraction, by estimation, is 40 to 45%. The left ventricle has mild to moderately decreased function. The left ventricle demonstrates global hypokinesis. The left ventricular internal cavity size was mildly to moderately dilated. There is borderline left ventricular hypertrophy. Left ventricular diastolic parameters are consistent with Grade I diastolic dysfunction (impaired relaxation). Right Ventricle: The right ventricular size is mildly enlarged. Mildly increased right ventricular wall thickness. Right ventricular systolic function is low normal. Left Atrium: Left atrial size was mild to moderately dilated. Right Atrium: Right atrial size was mildly  dilated. Pericardium: There is no evidence of pericardial effusion. Mitral Valve: The mitral valve is myxomatous. There is moderate thickening of the mitral valve leaflet(s). There is moderate calcification of the mitral valve leaflet(s). Moderately decreased mobility of the mitral valve leaflets. Moderate mitral annular  calcification. Mild to moderate mitral valve regurgitation. MV peak gradient, 21.3 mmHg. The mean mitral valve gradient is 7.5 mmHg. Tricuspid Valve: The tricuspid valve is grossly normal. Tricuspid valve regurgitation is mild. Aortic Valve: The aortic valve is bicuspid. There is moderate calcification of the aortic valve. There is moderate thickening of the aortic valve. There is moderate to severe aortic valve annular calcification. Aortic valve regurgitation is mild to moderate. Aortic regurgitation PHT measures 543 msec. Severe aortic stenosis is present. Aortic valve mean gradient measures 19.8 mmHg. Aortic valve peak gradient measures 34.7 mmHg. Aortic valve area, by VTI measures 0.62 cm. Pulmonic Valve: The pulmonic valve was grossly normal. Pulmonic valve regurgitation is mild. Aorta: Aortic dilatation noted. There is mild dilatation of the aortic root, measuring 32 mm. IAS/Shunts: No atrial level shunt detected by color flow Doppler.  LEFT VENTRICLE PLAX 2D LVIDd:         4.90 cm     Diastology LVIDs:         3.50 cm     LV e' medial:    6.85 cm/s LV PW:         1.20 cm     LV E/e' medial:  29.5 LV IVS:        1.10 cm     LV e' lateral:   5.87 cm/s LVOT diam:     1.80 cm     LV E/e' lateral: 34.4 LV SV:         33 LV SV Index:   17 LVOT Area:     2.54 cm  LV Volumes (MOD) LV vol d, MOD A4C: 39.1 ml LV vol s, MOD A4C: 22.9 ml LV SV MOD A4C:     39.1 ml RIGHT VENTRICLE RV Basal diam:  3.10 cm RV S prime:     10.00 cm/s TAPSE (M-mode): 1.2 cm LEFT ATRIUM  Index        RIGHT ATRIUM           Index LA diam:        4.90 cm 2.52 cm/m   RA Area:     12.60 cm LA Vol (A2C):   50.6 ml  25.98 ml/m  RA Volume:   31.40 ml  16.12 ml/m LA Vol (A4C):   48.6 ml 24.95 ml/m LA Biplane Vol: 53.9 ml 27.67 ml/m  AORTIC VALVE AV Area (Vmax):    0.60 cm AV Area (Vmean):   0.55 cm AV Area (VTI):     0.62 cm AV Vmax:           294.60 cm/s AV Vmean:          206.000 cm/s AV VTI:            0.530 m AV Peak Grad:      34.7 mmHg AV Mean Grad:      19.8 mmHg LVOT Vmax:         69.50 cm/s LVOT Vmean:        44.800 cm/s LVOT VTI:          0.128 m LVOT/AV VTI ratio: 0.24 AI PHT:            543 msec  AORTA Ao Root diam: 3.20 cm MITRAL VALVE                TRICUSPID VALVE MV Area (PHT): 1.92 cm     TR Peak grad:   63.7 mmHg MV Area VTI:   0.82 cm     TR Vmax:        399.00 cm/s MV Peak grad:  21.3 mmHg MV Mean grad:  7.5 mmHg     SHUNTS MV Vmax:       2.31 m/s     Systemic VTI:  0.13 m MV Vmean:      118.0 cm/s   Systemic Diam: 1.80 cm MV Decel Time: 395 msec MV E velocity: 202.00 cm/s Alwyn Pea MD Electronically signed by Alwyn Pea MD Signature Date/Time: 03/21/2022/12:56:25 PM    Final      Subjective: Patient seen examined bedside, resting comfortably.  Heart rate now controlled.  Updated spouse present at bedside.  Patient feels ready for discharge home.  Went over all discharge instructions/medications with both patient and spouse at bedside to ensure no confusion moving forward.  Also discussed need for complete cessation from alcohol and tobacco use moving forward.  No other specific questions or concerns at this time.  Denies headache, no dizziness, no chest pain, no palpitations, no fever/chills/night sweats, no nausea/vomiting/diarrhea, no focal weakness, no fatigue, no paresthesias.  No acute events overnight per nurse staff.  Discharge Exam: Vitals:   04/20/22 0630 04/20/22 0700  BP: 108/65 108/64  Pulse: 71 73  Resp: 14 16  Temp:  98 F (36.7 C)  SpO2: 93% 96%   Vitals:   04/20/22 0530 04/20/22 0600 04/20/22 0630 04/20/22 0700  BP: (!) 94/50 (!) 87/48 108/65 108/64   Pulse: 71 72 71 73  Resp: 15 15 14 16   Temp:    98 F (36.7 C)  TempSrc:    Oral  SpO2: 92% 90% 93% 96%  Weight:      Height:        Physical Exam: GEN: NAD, alert and oriented x 3, chronically ill appearance, appears older than stated age, disheveled HEENT: NCAT, PERRL, EOMI, sclera clear, MMM PULM:  CTAB w/o wheezes/crackles, normal respiratory effort, on room air CV: Irregularly irregular rhythm, normal rate, w/o M/G/R GI: abd soft, NTND, NABS, no R/G/M MSK: no peripheral edema, muscle strength globally intact 5/5 bilateral upper/lower extremities NEURO: CN II-XII intact, no focal deficits, sensation to light touch intact PSYCH: normal mood/affect Integumentary: Chronic venous changes bilateral lower extremities, otherwise no other concerning rashes/lesions/wounds noted on exposed skin surfaces    The results of significant diagnostics from this hospitalization (including imaging, microbiology, ancillary and laboratory) are listed below for reference.     Microbiology: No results found for this or any previous visit (from the past 240 hour(s)).   Labs: BNP (last 3 results) Recent Labs    03/20/22 2316 04/04/22 0947  BNP 695.9* 722.7*   Basic Metabolic Panel: Recent Labs  Lab 04/19/22 0105 04/19/22 0352 04/20/22 0250  NA 139 139 139  K 4.2 3.8 3.2*  CL 105 102 103  CO2 26 26 28   GLUCOSE 107* 112* 108*  BUN 12 11 17   CREATININE 1.04 0.96 1.13  CALCIUM 9.0 9.2 8.6*  MG 2.1  --  2.0   Liver Function Tests: Recent Labs  Lab 04/19/22 0105  AST 29  ALT 29  ALKPHOS 64  BILITOT 1.1  PROT 7.1  ALBUMIN 3.8   No results for input(s): "LIPASE", "AMYLASE" in the last 168 hours. No results for input(s): "AMMONIA" in the last 168 hours. CBC: Recent Labs  Lab 04/19/22 0105 04/19/22 0352  WBC 6.7 5.7  NEUTROABS 3.3  --   HGB 11.7* 11.9*  HCT 37.7* 37.9*  MCV 85.9 85.4  PLT 299 289   Cardiac Enzymes: No results for input(s): "CKTOTAL", "CKMB",  "CKMBINDEX", "TROPONINI" in the last 168 hours. BNP: Invalid input(s): "POCBNP" CBG: Recent Labs  Lab 04/19/22 0754 04/19/22 1228 04/19/22 1632 04/19/22 2114  GLUCAP 107* 127* 97 88   D-Dimer No results for input(s): "DDIMER" in the last 72 hours. Hgb A1c No results for input(s): "HGBA1C" in the last 72 hours. Lipid Profile No results for input(s): "CHOL", "HDL", "LDLCALC", "TRIG", "CHOLHDL", "LDLDIRECT" in the last 72 hours. Thyroid function studies Recent Labs    04/19/22 0352  TSH 1.545   Anemia work up No results for input(s): "VITAMINB12", "FOLATE", "FERRITIN", "TIBC", "IRON", "RETICCTPCT" in the last 72 hours. Urinalysis No results found for: "COLORURINE", "APPEARANCEUR", "LABSPEC", "PHURINE", "GLUCOSEU", "HGBUR", "BILIRUBINUR", "KETONESUR", "PROTEINUR", "UROBILINOGEN", "NITRITE", "LEUKOCYTESUR" Sepsis Labs Recent Labs  Lab 04/19/22 0105 04/19/22 0352  WBC 6.7 5.7   Microbiology No results found for this or any previous visit (from the past 240 hour(s)).   Time coordinating discharge: Over 30 minutes  SIGNED:   04/21/22 04/21/22, DO  [facility] 04/20/2022, 8:05 AM

## 2022-04-21 LAB — HEMOGLOBIN A1C
Hgb A1c MFr Bld: 6.4 % — ABNORMAL HIGH (ref 4.8–5.6)
Mean Plasma Glucose: 137 mg/dL

## 2022-04-25 ENCOUNTER — Other Ambulatory Visit: Payer: Self-pay

## 2022-04-25 ENCOUNTER — Emergency Department: Payer: No Typology Code available for payment source

## 2022-04-25 ENCOUNTER — Inpatient Hospital Stay
Admission: EM | Admit: 2022-04-25 | Discharge: 2022-05-05 | DRG: 280 | Disposition: A | Discharge status: Home Health | Payer: No Typology Code available for payment source | Attending: Internal Medicine | Admitting: Internal Medicine

## 2022-04-25 DIAGNOSIS — J4489 Other specified chronic obstructive pulmonary disease: Secondary | ICD-10-CM | POA: Diagnosis present

## 2022-04-25 DIAGNOSIS — F0393 Unspecified dementia, unspecified severity, with mood disturbance: Secondary | ICD-10-CM | POA: Diagnosis present

## 2022-04-25 DIAGNOSIS — I480 Paroxysmal atrial fibrillation: Secondary | ICD-10-CM | POA: Diagnosis present

## 2022-04-25 DIAGNOSIS — I959 Hypotension, unspecified: Secondary | ICD-10-CM | POA: Diagnosis not present

## 2022-04-25 DIAGNOSIS — I2510 Atherosclerotic heart disease of native coronary artery without angina pectoris: Secondary | ICD-10-CM | POA: Diagnosis present

## 2022-04-25 DIAGNOSIS — I5043 Acute on chronic combined systolic (congestive) and diastolic (congestive) heart failure: Secondary | ICD-10-CM | POA: Diagnosis present

## 2022-04-25 DIAGNOSIS — F03911 Unspecified dementia, unspecified severity, with agitation: Secondary | ICD-10-CM | POA: Diagnosis present

## 2022-04-25 DIAGNOSIS — F17210 Nicotine dependence, cigarettes, uncomplicated: Secondary | ICD-10-CM | POA: Diagnosis present

## 2022-04-25 DIAGNOSIS — Z7901 Long term (current) use of anticoagulants: Secondary | ICD-10-CM | POA: Diagnosis not present

## 2022-04-25 DIAGNOSIS — E1151 Type 2 diabetes mellitus with diabetic peripheral angiopathy without gangrene: Secondary | ICD-10-CM | POA: Diagnosis present

## 2022-04-25 DIAGNOSIS — I214 Non-ST elevation (NSTEMI) myocardial infarction: Principal | ICD-10-CM | POA: Diagnosis present

## 2022-04-25 DIAGNOSIS — Z923 Personal history of irradiation: Secondary | ICD-10-CM | POA: Diagnosis not present

## 2022-04-25 DIAGNOSIS — Z91148 Patient's other noncompliance with medication regimen for other reason: Secondary | ICD-10-CM | POA: Diagnosis not present

## 2022-04-25 DIAGNOSIS — F17200 Nicotine dependence, unspecified, uncomplicated: Secondary | ICD-10-CM | POA: Insufficient documentation

## 2022-04-25 DIAGNOSIS — N179 Acute kidney failure, unspecified: Secondary | ICD-10-CM | POA: Diagnosis not present

## 2022-04-25 DIAGNOSIS — F0394 Unspecified dementia, unspecified severity, with anxiety: Secondary | ICD-10-CM | POA: Diagnosis present

## 2022-04-25 DIAGNOSIS — F1011 Alcohol abuse, in remission: Secondary | ICD-10-CM | POA: Diagnosis present

## 2022-04-25 DIAGNOSIS — F1090 Alcohol use, unspecified, uncomplicated: Secondary | ICD-10-CM

## 2022-04-25 DIAGNOSIS — I350 Nonrheumatic aortic (valve) stenosis: Secondary | ICD-10-CM | POA: Diagnosis present

## 2022-04-25 DIAGNOSIS — J810 Acute pulmonary edema: Secondary | ICD-10-CM | POA: Diagnosis present

## 2022-04-25 DIAGNOSIS — E876 Hypokalemia: Secondary | ICD-10-CM | POA: Diagnosis not present

## 2022-04-25 DIAGNOSIS — T502X5A Adverse effect of carbonic-anhydrase inhibitors, benzothiadiazides and other diuretics, initial encounter: Secondary | ICD-10-CM | POA: Diagnosis not present

## 2022-04-25 DIAGNOSIS — D72829 Elevated white blood cell count, unspecified: Secondary | ICD-10-CM | POA: Diagnosis present

## 2022-04-25 DIAGNOSIS — E1142 Type 2 diabetes mellitus with diabetic polyneuropathy: Secondary | ICD-10-CM | POA: Diagnosis present

## 2022-04-25 DIAGNOSIS — J449 Chronic obstructive pulmonary disease, unspecified: Secondary | ICD-10-CM | POA: Diagnosis present

## 2022-04-25 DIAGNOSIS — Z1152 Encounter for screening for COVID-19: Secondary | ICD-10-CM

## 2022-04-25 DIAGNOSIS — T501X5A Adverse effect of loop [high-ceiling] diuretics, initial encounter: Secondary | ICD-10-CM | POA: Diagnosis not present

## 2022-04-25 DIAGNOSIS — Z85118 Personal history of other malignant neoplasm of bronchus and lung: Secondary | ICD-10-CM | POA: Diagnosis not present

## 2022-04-25 DIAGNOSIS — Z888 Allergy status to other drugs, medicaments and biological substances status: Secondary | ICD-10-CM

## 2022-04-25 DIAGNOSIS — I482 Chronic atrial fibrillation, unspecified: Secondary | ICD-10-CM | POA: Diagnosis present

## 2022-04-25 DIAGNOSIS — I110 Hypertensive heart disease with heart failure: Secondary | ICD-10-CM | POA: Diagnosis present

## 2022-04-25 DIAGNOSIS — Z7902 Long term (current) use of antithrombotics/antiplatelets: Secondary | ICD-10-CM

## 2022-04-25 DIAGNOSIS — R911 Solitary pulmonary nodule: Secondary | ICD-10-CM | POA: Diagnosis present

## 2022-04-25 DIAGNOSIS — I739 Peripheral vascular disease, unspecified: Secondary | ICD-10-CM | POA: Insufficient documentation

## 2022-04-25 DIAGNOSIS — Z880 Allergy status to penicillin: Secondary | ICD-10-CM

## 2022-04-25 DIAGNOSIS — Z7984 Long term (current) use of oral hypoglycemic drugs: Secondary | ICD-10-CM

## 2022-04-25 DIAGNOSIS — G8929 Other chronic pain: Secondary | ICD-10-CM | POA: Diagnosis present

## 2022-04-25 DIAGNOSIS — Z79899 Other long term (current) drug therapy: Secondary | ICD-10-CM

## 2022-04-25 DIAGNOSIS — I10 Essential (primary) hypertension: Secondary | ICD-10-CM | POA: Diagnosis present

## 2022-04-25 LAB — PROTIME-INR
INR: 1.5 — ABNORMAL HIGH (ref 0.8–1.2)
Prothrombin Time: 17.9 seconds — ABNORMAL HIGH (ref 11.4–15.2)

## 2022-04-25 LAB — BRAIN NATRIURETIC PEPTIDE: B Natriuretic Peptide: 791.9 pg/mL — ABNORMAL HIGH (ref 0.0–100.0)

## 2022-04-25 LAB — BASIC METABOLIC PANEL
Anion gap: 9 (ref 5–15)
BUN: 20 mg/dL (ref 8–23)
CO2: 26 mmol/L (ref 22–32)
Calcium: 9.2 mg/dL (ref 8.9–10.3)
Chloride: 102 mmol/L (ref 98–111)
Creatinine, Ser: 1.06 mg/dL (ref 0.61–1.24)
GFR, Estimated: >60 mL/min (ref >60)
Glucose, Bld: 135 mg/dL — ABNORMAL HIGH (ref 70–99)
Potassium: 3.8 mmol/L (ref 3.5–5.1)
Sodium: 137 mmol/L (ref 135–145)

## 2022-04-25 LAB — URINALYSIS, ROUTINE W REFLEX MICROSCOPIC
Bilirubin Urine: NEGATIVE
Glucose, UA: NEGATIVE mg/dL
Hgb urine dipstick: NEGATIVE
Ketones, ur: NEGATIVE mg/dL
Leukocytes,Ua: NEGATIVE
Nitrite: NEGATIVE
Protein, ur: NEGATIVE mg/dL
Specific Gravity, Urine: 1.005 (ref 1.005–1.030)
pH: 7 (ref 5.0–8.0)

## 2022-04-25 LAB — RESP PANEL BY RT-PCR (RSV, FLU A&B, COVID)  RVPGX2
Influenza A by PCR: NEGATIVE
Influenza B by PCR: NEGATIVE
Resp Syncytial Virus by PCR: NEGATIVE
SARS Coronavirus 2 by RT PCR: NEGATIVE

## 2022-04-25 LAB — CBC
HCT: 38.1 % — ABNORMAL LOW (ref 39.0–52.0)
Hemoglobin: 11.8 g/dL — ABNORMAL LOW (ref 13.0–17.0)
MCH: 26.3 pg (ref 26.0–34.0)
MCHC: 31 g/dL (ref 30.0–36.0)
MCV: 85 fL (ref 80.0–100.0)
Platelets: 289 10*3/uL (ref 150–400)
RBC: 4.48 MIL/uL (ref 4.22–5.81)
RDW: 16.2 % — ABNORMAL HIGH (ref 11.5–15.5)
WBC: 9.3 10*3/uL (ref 4.0–10.5)
nRBC: 0 % (ref 0.0–0.2)

## 2022-04-25 LAB — TROPONIN I (HIGH SENSITIVITY)
Troponin I (High Sensitivity): 245 ng/L (ref <18)
Troponin I (High Sensitivity): 254 ng/L (ref <18)

## 2022-04-25 LAB — HEPARIN LEVEL (UNFRACTIONATED): Heparin Unfractionated: >1.1 IU/mL — ABNORMAL HIGH (ref 0.30–0.70)

## 2022-04-25 LAB — APTT: aPTT: 40 seconds — ABNORMAL HIGH (ref 24–36)

## 2022-04-25 MED ORDER — ACETAMINOPHEN 325 MG PO TABS: 650.0000 mg | ORAL_TABLET | ORAL | Status: DC | PRN

## 2022-04-25 MED ORDER — ASPIRIN 81 MG PO CHEW
324.0000 mg | CHEWABLE_TABLET | Freq: Once | ORAL | Status: AC
  Administered 2022-04-26: 324 mg via ORAL
  Filled 2022-04-25: qty 4

## 2022-04-25 MED ORDER — METOPROLOL TARTRATE 50 MG PO TABS
50.0000 mg | ORAL_TABLET | Freq: Once | ORAL | Status: AC
  Administered 2022-04-25: 50 mg via ORAL
  Filled 2022-04-25: qty 1

## 2022-04-25 MED ORDER — CLOPIDOGREL BISULFATE 75 MG PO TABS
75.0000 mg | ORAL_TABLET | Freq: Every day | ORAL | Status: DC
  Administered 2022-04-26 – 2022-05-05 (×10): 75 mg via ORAL
  Filled 2022-04-25 (×12): qty 1

## 2022-04-25 MED ORDER — HEPARIN BOLUS VIA INFUSION
4000.0000 [IU] | Freq: Once | INTRAVENOUS | Status: AC
  Administered 2022-04-25: 4000 [IU] via INTRAVENOUS
  Filled 2022-04-25: qty 4000

## 2022-04-25 MED ORDER — MOMETASONE FURO-FORMOTEROL FUM 200-5 MCG/ACT IN AERO
2.0000 | INHALATION_SPRAY | Freq: Two times a day (BID) | RESPIRATORY_TRACT | Status: DC
  Administered 2022-04-26 – 2022-05-05 (×19): 2 via RESPIRATORY_TRACT
  Filled 2022-04-25 (×2): qty 8.8

## 2022-04-25 MED ORDER — INSULIN ASPART 100 UNIT/ML IJ SOLN: 0.0000 [IU] | Freq: Every day | INTRAMUSCULAR | Status: DC

## 2022-04-25 MED ORDER — FUROSEMIDE 10 MG/ML IJ SOLN
40.0000 mg | Freq: Two times a day (BID) | INTRAMUSCULAR | Status: DC
  Administered 2022-04-26 – 2022-04-29 (×7): 40 mg via INTRAVENOUS
  Filled 2022-04-25 (×6): qty 4

## 2022-04-25 MED ORDER — ONDANSETRON HCL 4 MG/2ML IJ SOLN: 4.0000 mg | Freq: Four times a day (QID) | INTRAMUSCULAR | Status: DC | PRN

## 2022-04-25 MED ORDER — METOPROLOL TARTRATE 50 MG PO TABS
50.0000 mg | ORAL_TABLET | Freq: Two times a day (BID) | ORAL | Status: DC
  Administered 2022-04-25 – 2022-04-28 (×6): 50 mg via ORAL
  Filled 2022-04-25 (×6): qty 1

## 2022-04-25 MED ORDER — NITROGLYCERIN 0.4 MG SL SUBL
0.4000 mg | SUBLINGUAL_TABLET | SUBLINGUAL | Status: DC | PRN
  Administered 2022-04-25 – 2022-04-26 (×2): 0.4 mg via SUBLINGUAL
  Filled 2022-04-25 (×2): qty 1

## 2022-04-25 MED ORDER — TIOTROPIUM BROMIDE MONOHYDRATE 18 MCG IN CAPS
18.0000 ug | ORAL_CAPSULE | Freq: Every day | RESPIRATORY_TRACT | Status: DC
  Administered 2022-04-26 – 2022-05-05 (×10): 18 ug via RESPIRATORY_TRACT
  Filled 2022-04-25 (×3): qty 5

## 2022-04-25 MED ORDER — HEPARIN (PORCINE) 25000 UT/250ML-% IV SOLN
900.0000 [IU]/h | INTRAVENOUS | Status: DC
  Administered 2022-04-25: 900 [IU]/h via INTRAVENOUS
  Filled 2022-04-25: qty 250

## 2022-04-25 MED ORDER — OXYCODONE HCL 5 MG PO TABS
5.0000 mg | ORAL_TABLET | ORAL | Status: DC | PRN
  Administered 2022-04-27 – 2022-04-30 (×6): 5 mg via ORAL
  Filled 2022-04-25 (×7): qty 1

## 2022-04-25 MED ORDER — INSULIN ASPART 100 UNIT/ML IJ SOLN
0.0000 [IU] | Freq: Three times a day (TID) | INTRAMUSCULAR | Status: DC
  Administered 2022-04-26: 1 [IU] via SUBCUTANEOUS
  Administered 2022-04-28: 2 [IU] via SUBCUTANEOUS
  Administered 2022-04-28: 1 [IU] via SUBCUTANEOUS
  Administered 2022-04-29: 2 [IU] via SUBCUTANEOUS
  Administered 2022-04-30: 1 [IU] via SUBCUTANEOUS
  Administered 2022-04-30: 2 [IU] via SUBCUTANEOUS
  Administered 2022-05-01 – 2022-05-02 (×4): 1 [IU] via SUBCUTANEOUS
  Administered 2022-05-02 – 2022-05-03 (×2): 2 [IU] via SUBCUTANEOUS
  Administered 2022-05-04: 1 [IU] via SUBCUTANEOUS
  Filled 2022-04-25 (×16): qty 1

## 2022-04-25 MED ORDER — FUROSEMIDE 10 MG/ML IJ SOLN
40.0000 mg | Freq: Once | INTRAMUSCULAR | Status: AC
  Administered 2022-04-25: 40 mg via INTRAVENOUS
  Filled 2022-04-25: qty 4

## 2022-04-25 MED ORDER — LISINOPRIL 5 MG PO TABS
5.0000 mg | ORAL_TABLET | Freq: Every day | ORAL | Status: DC
  Administered 2022-04-26 – 2022-04-28 (×4): 5 mg via ORAL
  Filled 2022-04-25 (×4): qty 1

## 2022-04-25 MED ORDER — ATORVASTATIN CALCIUM 80 MG PO TABS
80.0000 mg | ORAL_TABLET | Freq: Every day | ORAL | Status: DC
  Administered 2022-04-26 – 2022-05-05 (×11): 80 mg via ORAL
  Filled 2022-04-25 (×2): qty 1
  Filled 2022-04-25 (×2): qty 4
  Filled 2022-04-25: qty 1
  Filled 2022-04-25: qty 4
  Filled 2022-04-25 (×6): qty 1

## 2022-04-25 MED ORDER — METHADONE HCL 10 MG PO TABS
5.0000 mg | ORAL_TABLET | Freq: Every day | ORAL | Status: DC
  Administered 2022-04-25 – 2022-05-04 (×10): 5 mg via ORAL
  Filled 2022-04-25 (×11): qty 1

## 2022-04-25 MED ORDER — NICOTINE 21 MG/24HR TD PT24
21.0000 mg | MEDICATED_PATCH | Freq: Every day | TRANSDERMAL | Status: DC
  Administered 2022-04-26 – 2022-04-28 (×4): 21 mg via TRANSDERMAL
  Filled 2022-04-25 (×4): qty 1

## 2022-04-25 NOTE — Assessment & Plan Note (Signed)
Not acutely exacerbated Continue home inhalers DuoNebs as needed 

## 2022-04-25 NOTE — Assessment & Plan Note (Addendum)
12 pack beer daily, quit cold [place] a week prior and has no  withdrawals CIWA

## 2022-04-25 NOTE — Assessment & Plan Note (Signed)
Dyspnea on exertion with BNP 791 and pulmonary vascular congestion on chest x-ray Last echo from 12/2 shows EF 40 to 45% with G1 DD IV Lasix Continue metoprolol and lisinopril Daily weights with intake and output monitoring

## 2022-04-25 NOTE — H&P (Addendum)
History and Physical    Patient: Jason Beck IRC:[id number] DOB: 25-Oct-1955 DOA: 04/25/2022 DOS: the patient was seen and examined on 04/25/2022 PCP: [facility]  Patient coming from: Home  Chief Complaint:  Chief Complaint  Patient presents with   Weakness    HPI: Jason Beck is a 67 y.o. 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  He complains of intermittent sharp right-sided chest pain.  Denies lower extremity edema or orthopnea.  He is compliant with his Eliquis.  Has had no cough, fever or chills.  States he felt so poorly he did not take his medications today.  He was brought in by EMS who reported a heart rate of 80-1 30 with otherwise normal vitals.   ED course and data review: On arrival, afebrile BP 112/97 with pulse 122 and otherwise normal vitals.  Troponin 245 with BNP 791.9.  WBC and respiratory viral panel negative for COVID flu and RSV.  Hemoglobin at baseline at 11.8.  BMP unremarkable.  EKG, personally viewed and interpreted showing A-fib at 122 with nonspecific ST-T wave changes. Chest x-ray showed mild cardiomegaly and pulmonary vascular congestion.  No focal consolidation or large pleural effusion. Patient was administered his home metoprolol with improvement in heart rate to 99 by admission.  He was also treated with a dose of IV Lasix for possible CHF and started on heparin infusion for possible NSTEMI.  Hospitalist consulted for admission.    Review of Systems: As mentioned in the history of present illness. All other systems reviewed and are negative.  Past Medical History:  Diagnosis Date   Asthma    Atrial fibrillation (HCC)    Bicuspid aortic valve    CHF (congestive heart failure) (HCC)    COPD (chronic obstructive pulmonary disease) (HCC)    Depression    Diabetes mellitus without complication (HCC)    Hypertension    Past Surgical History:  Procedure Laterality Date   toe amputations     Social History:  reports that he has been smoking cigarettes. He has been smoking an average of 1.5 packs per day. He has never used smokeless tobacco. He reports current alcohol use of about 2.0 - 3.0 standard drinks of alcohol per week. He reports current drug use. Drug: Marijuana.  Allergies  Allergen Reactions   Niacin Other (See Comments)   Penicillin G     History reviewed. No pertinent family history.  Prior to Admission medications   Medication Sig Start Date End Date Taking? Authorizing Provider  apixaban (ELIQUIS) 5 MG TABS tablet Take 1 tablet (5 mg total) by mouth 2 (two) times daily. 04/04/22 05/04/22  Georga Hacking, MD  atorvastatin (LIPITOR) 80 MG tablet Take 1 tablet (80 mg total) by mouth daily. 04/04/22 03/30/23  Georga Hacking, MD  ciclesonide (ALVESCO) 160 MCG/ACT inhaler Inhale 2 puffs into the lungs 2 (two) times daily. 04/04/22 05/04/22  Georga Hacking, MD  clopidogrel (PLAVIX) 75 MG tablet Take 1 tablet (75 mg total)  by mouth daily. 04/04/22   Georga Hacking, MD  fluticasone-salmeterol (ADVAIR) 250-50 MCG/ACT AEPB Inhale 1 puff into the lungs in the morning and at bedtime. 04/04/22   Georga Hacking, MD  furosemide (LASIX) 20 MG tablet Take 2 tablets (40 mg total) by mouth daily. Patient taking differently: Take 20 mg by mouth daily. 04/04/22 10/01/22  Georga Hacking, MD  gabapentin (NEURONTIN) 300 MG capsule Take 1 capsule (300 mg total) by mouth 3 (three) times daily. 04/04/22  05/04/22  Georga Hacking, MD  lisinopril (ZESTRIL) 5 MG tablet Take 1 tablet (5 mg total) by mouth daily. 04/20/22 07/19/22  [place], Alvira Philips, DO  metFORMIN (GLUCOPHAGE) 500 MG tablet Take 1 tablet (500 mg total) by mouth 2 (two) times daily with a meal. 04/04/22 05/04/22  Georga Hacking, MD  methadone (DOLOPHINE) 5 MG tablet Take 1 tablet (5 mg total) by mouth at bedtime. 04/04/22   Georga Hacking, MD  metoprolol tartrate (LOPRESSOR) 50 MG tablet Take 1 tablet (50 mg total) by mouth 2 (two) times daily. 04/20/22 07/19/22  [place], Eric J, DO  oxycodone (OXY-IR) 5 MG capsule Take 5 mg by mouth every 4 (four) hours as needed.    [provider]  tiotropium (SPIRIVA) 18 MCG inhalation capsule Place 1 capsule (18 mcg total) into inhaler and inhale daily. 04/04/22 06/03/22  Georga Hacking, MD    Physical Exam: Vitals:   04/25/22 1938 04/25/22 2000 04/25/22 2030 04/25/22 2100  BP:  (!) 147/118 120/78 (!) 149/85  Pulse: (!) 36 (!) 104 (!) 114 98  Resp: 12 16 18 18   Temp:      TempSrc:      SpO2: 97% 96% 96% 96%   Physical Exam Vitals and nursing note reviewed.  Constitutional:      General: He is not in acute distress. HENT:     Head: Normocephalic and atraumatic.  Cardiovascular:     Rate and Rhythm: Regular rhythm. Tachycardia present.     Heart sounds: Normal heart sounds.  Pulmonary:     Effort: Tachypnea present.     Breath sounds: Examination of the right-upper field reveals wheezing. Examination of the right-middle field reveals wheezing. Wheezing present.     Comments: Inspiratory wheezes upper and mid right lung Abdominal:     Palpations: Abdomen is soft.     Tenderness: There is no abdominal tenderness.  Musculoskeletal:     Right lower leg: No edema.     Left lower leg: No edema.     Comments: Amputation digits right foot  Neurological:     Mental Status: Mental status is at baseline.     Labs on Admission: I have personally reviewed following labs and  imaging studies  CBC: Recent Labs  Lab 04/19/22 0105 04/19/22 0352 04/25/22 1642  WBC 6.7 5.7 9.3  NEUTROABS 3.3  --   --   HGB 11.7* 11.9* 11.8*  HCT 37.7* 37.9* 38.1*  MCV 85.9 85.4 85.0  PLT 299 289 289   Basic Metabolic Panel: Recent Labs  Lab 04/19/22 0105 04/19/22 0352 04/20/22 0250 04/25/22 1642  NA 139 139 139 137  K 4.2 3.8 3.2* 3.8  CL 105 102 103 102  CO2 26 26 28 26   GLUCOSE 107* 112* 108* 135*  BUN 12 11 17 20   CREATININE 1.04 0.96 1.13 1.06  CALCIUM 9.0 9.2 8.6* 9.2  MG 2.1  --  2.0  --    GFR: Estimated Creatinine Clearance: 69.8 mL/min (  by C-G formula based on SCr of 1.06 mg/dL). Liver Function Tests: Recent Labs  Lab 04/19/22 0105  AST 29  ALT 29  ALKPHOS 64  BILITOT 1.1  PROT 7.1  ALBUMIN 3.8   No results for input(s): "LIPASE", "AMYLASE" in the last 168 hours. No results for input(s): "AMMONIA" in the last 168 hours. Coagulation Profile: No results for input(s): "INR", "PROTIME" in the last 168 hours. Cardiac Enzymes: No results for input(s): "CKTOTAL", "CKMB", "CKMBINDEX", "TROPONINI" in the last 168 hours. BNP (last 3 results) No results for input(s): "PROBNP" in the last 8760 hours. HbA1C: No results for input(s): "HGBA1C" in the last 72 hours. CBG: Recent Labs  Lab 04/19/22 0754 04/19/22 1228 04/19/22 1632 04/19/22 2114 04/20/22 0936  GLUCAP 107* 127* 97 88 195*   Lipid Profile: No results for input(s): "CHOL", "HDL", "LDLCALC", "TRIG", "CHOLHDL", "LDLDIRECT" in the last 72 hours. Thyroid Function Tests: No results for input(s): "TSH", "T4TOTAL", "FREET4", "T3FREE", "THYROIDAB" in the last 72 hours. Anemia Panel: No results for input(s): "VITAMINB12", "FOLATE", "FERRITIN", "TIBC", "IRON", "RETICCTPCT" in the last 72 hours. Urine analysis: No results found for: "COLORURINE", "APPEARANCEUR", "LABSPEC", "PHURINE", "GLUCOSEU", "HGBUR", "BILIRUBINUR", "KETONESUR", "PROTEINUR", "UROBILINOGEN", "NITRITE",  "LEUKOCYTESUR"  Radiological Exams on Admission: DG Chest Portable 1 View  Result Date: 04/25/2022 CLINICAL DATA:  Shortness of breath. EXAM: PORTABLE CHEST 1 VIEW COMPARISON:  Radiographs dated April 19, 2022. FINDINGS: The heart is mildly enlarged. Atherosclerotic calcification of the aortic arch. Mild pulmonary vascular congestion. No focal consolidation or large pleural effusion. Thoracic spondylosis. IMPRESSION: Mild cardiomegaly and pulmonary vascular congestion. No focal consolidation or large pleural effusion. Electronically Signed   By: Larose Hires D.O.   On: 04/25/2022 20:30     Data Reviewed: Relevant notes from primary care and specialist visits, past discharge summaries as available in EHR, including Care Everywhere. Prior diagnostic testing as pertinent to current admission diagnoses Updated medications and problem lists for reconciliation ED course, including vitals, labs, imaging, treatment and response to treatment Triage notes, nursing and pharmacy notes and ED provider's notes Notable results as noted in HPI   Assessment and Plan: * NSTEMI (non-ST elevated myocardial infarction) [facility]) Patient presents with fatigue and dyspnea on exertion with troponin 245  Has right sided chest pain and with nonacute EKG Was ruled out for MI after presenting as code STEMI in December with troponin peaks in the 500s.  No cath done Will treat as type I NSTEMI though possibility could be demand ischemia Continue heparin infusion Continue metoprolol, lisinopril and atorvastatin.  Not on antiplatelets Limited echo to evaluate for wall motion abnormality Cardiology consult  Acute on chronic combined systolic and diastolic CHF (congestive heart failure) (HCC) Dyspnea on exertion with BNP 791 and pulmonary vascular congestion on chest x-ray Last echo from 12/2 shows EF 40 to 45% with G1 DD IV Lasix Continue metoprolol and lisinopril Daily weights with intake and output  monitoring  Paroxysmal atrial fibrillation with RVR (HCC) Heart rate 122 on arrival in the setting of missing medication on the day of arrival Recently hospitalized from 12/31 through 04/20/2022 with rapid A-fib attributed to medication noncompliance, Improved with home dose of metoprolol Hold Eliquis while on heparin infusion.  Continue metoprolol Explore barriers to medication compliance.  Type 2 diabetes mellitus with peripheral neuropathy (HCC) Controlled.  Last A1c 6.4 on 12/31 Sliding scale insulin coverage while hospitalized  Tobacco use disorder Nicotine patch  Alcohol use disorder, in remission 12 pack beer daily, quit cold [place] a week prior and  has no  withdrawals CIWA   PVD (peripheral vascular disease) (HCC) Continue aspirin and atorvastatin History of amputation digits right foot  History of lung cancer Spiculated nodule on CT chest 04/25/21 S/p radiation  at [facility] 2019 CTA showed "Spiculated mixed solid and cystic nodule within the anterior left upper lobe measuring 18 mm, indeterminate. Consider one of the following in 3 months for both low-risk and high-risk individuals: (a) repeat chest CT, (b) follow-up PET-CT, or (c) tissue sampling. This recommendation follows the consensus statement: Guidelines for Management of Incidental Pulmonary Nodules Detected on CT Images: From the Fleischner Society 2017; Radiology 2017; 284:228-243"  Chronic pain Continue methadone and oxycodone  Essential hypertension Continue lisinopril and metoprolol  Chronic obstructive pulmonary disease (COPD) ([place]) Not acutely exacerbated Continue home inhalers DuoNebs as needed        DVT prophylaxis: heparin infusion  Consults: [facility] cardiology Dr. Humphrey Rolls  Advance Care Planning:   Code Status: Prior   Family Communication: sister at bedside  Disposition Plan: Back to previous home environment  Severity of Illness: The appropriate patient status for this patient is  INPATIENT. Inpatient status is judged to be reasonable and necessary in order to provide the required intensity of service to ensure the patient's safety. The patient's presenting symptoms, physical exam findings, and initial radiographic and laboratory data in the context of their chronic comorbidities is felt to place them at high risk for further clinical deterioration. Furthermore, it is not anticipated that the patient will be medically stable for discharge from the hospital within 2 midnights of admission.   * I certify that at the point of admission it is my clinical judgment that the patient will require inpatient hospital care spanning beyond 2 midnights from the point of admission due to high intensity of service, high risk for further deterioration and high frequency of surveillance required.*  Author: Athena Masse, MD 04/25/2022 9:26 PM  For on call review [email or web address].

## 2022-04-25 NOTE — Assessment & Plan Note (Signed)
Continue aspirin and atorvastatin History of amputation digits right foot

## 2022-04-25 NOTE — ED Triage Notes (Signed)
Pt arrives via EMS from home- pt had a recent afib diagnosis- pt states he has been feeling weak today and SHOB- pt O2 98% on RA per EMS, cbg was 207- pt denies chest pain- pt HR was 80-130 per EMS in an afib rhythm

## 2022-04-25 NOTE — Assessment & Plan Note (Signed)
Continue lisinopril and metoprolol ?

## 2022-04-25 NOTE — Consult Note (Signed)
ANTICOAGULATION CONSULT NOTE - Initial Consult  Pharmacy Consult for heparin drip Indication: chest pain/ACS  Allergies  Allergen Reactions   Niacin Other (See Comments)   Penicillin G     Patient Measurements:   Heparin Dosing Weight: 74.4 kg  Vital Signs: Temp: 97.6 F (36.4 C) (01/06 1936) Temp Source: Oral (01/06 1936) BP: 149/85 (01/06 2100) Pulse Rate: 98 (01/06 2100)  Labs: Recent Labs    04/25/22 1642  HGB 11.8*  HCT 38.1*  PLT 289  CREATININE 1.06  TROPONINIHS 245*    Estimated Creatinine Clearance: 69.8 mL/min (by C-G formula based on SCr of 1.06 mg/dL).   Medical History: Past Medical History:  Diagnosis Date   Asthma    Atrial fibrillation (HCC)    Bicuspid aortic valve    CHF (congestive heart failure) (HCC)    COPD (chronic obstructive pulmonary disease) ([place])    Depression    Diabetes mellitus without complication ([place])    Hypertension     Medications:  Patient takes Eliquis 5 mg po BID, provider reported last dose 1/5 in evening.  Assessment: 67 yo male with history of Afib brought in by EMS with elevated heart rate.  Pharmacy consulted to start heparin drip for possible NSTEMI.  Goal of Therapy:  aPTT 66-102 seconds Heparin level 0.3-0.7 Monitor platelets by anticoagulation protocol: Yes   Plan:  Heparin 4000 units bolus IV x 1 Start heparin drip at 900 units/hr Check aPTT 6 hours after initiation Use aPTT dose adjustments until correlates with heparin level Daily CBC while on heparin drip  Lorin Picket 04/25/2022,9:23 PM

## 2022-04-25 NOTE — Assessment & Plan Note (Signed)
-  Nicotine patch 

## 2022-04-25 NOTE — Assessment & Plan Note (Addendum)
Patient presents with fatigue and dyspnea on exertion with troponin 245  Has right sided chest pain and with nonacute EKG Was ruled out for MI after presenting as code STEMI in December with troponin peaks in the 500s.  No cath done Will treat as type I NSTEMI though possibility could be demand ischemia Continue heparin infusion Continue metoprolol, lisinopril and atorvastatin.  Not on antiplatelets Limited echo to evaluate for wall motion abnormality Cardiology consult

## 2022-04-25 NOTE — ED Provider Notes (Signed)
[facility] Provider Note    Event Date/Time   First MD Initiated Contact with Patient 04/25/22 1935     (approximate)   History   Chief Complaint: Weakness   HPI  Jason Beck is a 67 y.o. male with a history of CHF COPD hypertension atrial fibrillation on Eliquis and diabetes who comes ED complaining of worsening shortness of breath and generalized weakness over the last 3 days.  Increased dyspnea on exertion.  Also some intermittent vague right-sided chest pain which is nonradiating and not exertional.  He has been compliant with his medications up through yesterday, but today he was feeling so ill that he did not take any of his medications.     Physical Exam   Triage Vital Signs: ED Triage Vitals  Enc Vitals Group     BP 04/25/22 1644 (!) 112/97     Pulse Rate 04/25/22 1644 (!) 122     Resp 04/25/22 1644 20     Temp 04/25/22 1644 98.3 F (36.8 C)     Temp Source 04/25/22 1644 Oral     SpO2 04/25/22 1644 95 %     Weight --      Height --      Head Circumference --      Peak Flow --      Pain Score 04/25/22 1640 0     Pain Loc --      Pain Edu? --      Excl. in GC? --     Most recent vital signs: Vitals:   04/25/22 2030 04/25/22 2100  BP: 120/78 (!) 149/85  Pulse: (!) 114 98  Resp: 18 18  Temp:    SpO2: 96% 96%    General: Awake, no distress.  CV:  Good peripheral perfusion.  Irregular rhythm, tachycardiac heart rate 120.  Normal distal pulses. Resp:  Normal effort.  Diminished breath sounds at bilateral bases Abd:  No distention.  Soft nontender Other:  No lower extremity edema.     ED Results / Procedures / Treatments   Labs (all labs ordered are listed, but only abnormal results are displayed) Labs Reviewed  BASIC METABOLIC PANEL - Abnormal; Notable for the following components:      Result Value   Glucose, Bld 135 (*)    All other components within normal limits  CBC - Abnormal; Notable for the following  components:   Hemoglobin 11.8 (*)    HCT 38.1 (*)    RDW 16.2 (*)    All other components within normal limits  BRAIN NATRIURETIC PEPTIDE - Abnormal; Notable for the following components:   B Natriuretic Peptide 791.9 (*)    All other components within normal limits  TROPONIN I (HIGH SENSITIVITY) - Abnormal; Notable for the following components:   Troponin I (High Sensitivity) 245 (*)    All other components within normal limits  RESP PANEL BY RT-PCR (RSV, FLU A&B, COVID)  [id number]  URINALYSIS, ROUTINE W REFLEX MICROSCOPIC     EKG Interpreted by me Atrial fibrillation, rate of 106.  Right axis, normal intervals.  Normal QRS ST segments and T waves, no ischemic changes.   RADIOLOGY Chest x-ray interpreted by me, appears normal.  Radiology report reviewed noting some vascular congestion.   PROCEDURES:  .Critical Care  Performed by: Sharman Cheek, MD Authorized by: Sharman Cheek, MD   Critical care provider statement:    Critical care time (minutes):  35   Critical care time was exclusive of:  Separately billable procedures and treating other patients   Critical care was necessary to treat or prevent imminent or life-threatening deterioration of the following conditions:  Cardiac failure and circulatory failure   Critical care was time spent personally by me on the following activities:  Development of treatment plan with patient or surrogate, discussions with consultants, evaluation of patient's response to treatment, examination of patient, obtaining history from patient or surrogate, ordering and performing treatments and interventions, ordering and review of laboratory studies, ordering and review of radiographic studies, pulse oximetry, re-evaluation of patient's condition and review of old charts   Care discussed with: admitting provider   Comments:        .1-3 Lead EKG Interpretation  Performed by: Carrie Mew, MD Authorized by: Carrie Mew, MD      Interpretation: abnormal     ECG rate:  130   ECG rate assessment: tachycardic     Rhythm: atrial fibrillation     Ectopy: none      MEDICATIONS ORDERED IN ED: Medications  metoprolol tartrate (LOPRESSOR) tablet 50 mg (50 mg Oral Given 04/25/22 2021)     IMPRESSION / MDM / [place] / ED COURSE  I reviewed the triage vital signs and the nursing notes.                              Differential diagnosis includes, but is not limited to, symptomatic A-fib with RVR, acute heart failure, non-STEMI, pneumonia, pleural effusion, viral illness, electrolyte abnormality, anemia  Patient's presentation is most consistent with acute presentation with potential threat to life or bodily function.  Patient presents with worsening fatigue and dyspnea on exertion.  Found to be in A-fib with RVR, heart rate of about 130, possibly worsened by medication noncompliance today.  Will obtain labs, chest x-ray, give home dose of oral metoprolol  ----------------------------------------- 9:00 PM on 04/25/2022 ----------------------------------------- Labs showed troponin of 245, concerning for non-STEMI.  Last Eliquis was about 24 hours ago, will order heparin per pharmacy protocol.  Patient having some recurrent bouts of chest pain which are being managed with sublingual nitroglycerin.  Aspirin ordered.   Clinical Course as of 04/25/22 2324  Sat Apr 25, 2022  2114 Heart rate improved 100.  Still in atrial fibrillation.  Shortness of breath not improved.  Last took Eliquis yesterday.  Will start heparin bolus and drip, IV Lasix for diuresis.  Will need to admit for further cardiac workup, concern for NSTEMI versus A-fib RVR versus decompensated CHF. [PS]    Clinical Course User Index [PS] Carrie Mew, MD    ----------------------------------------- 11:31 PM on 04/25/2022 ----------------------------------------- Case discussed with hospitalist.   FINAL CLINICAL IMPRESSION(S) / ED  DIAGNOSES   Final diagnoses:  NSTEMI (non-ST elevated myocardial infarction) ([place])  Chronic atrial fibrillation ([place])  Acute pulmonary edema ([place])     Rx / DC Orders   ED Discharge Orders     None        Note:  This document was prepared using Dragon voice recognition software and may include unintentional dictation errors.   Carrie Mew, MD 04/25/22 2332

## 2022-04-25 NOTE — Assessment & Plan Note (Addendum)
Spiculated nodule on CT chest 04/25/21 S/p radiation  at the [facility] 2019 CTA showed "Spiculated mixed solid and cystic nodule within the anterior left upper lobe measuring 18 mm, indeterminate. Consider one of the following in 3 months for both low-risk and high-risk individuals: (a) repeat chest CT, (b) follow-up PET-CT, or (c) tissue sampling. This recommendation follows the consensus statement: Guidelines for Management of Incidental Pulmonary Nodules Detected on CT Images: From the Fleischner Society 2017; Radiology 2017; [phone]"

## 2022-04-25 NOTE — Assessment & Plan Note (Signed)
Continue methadone and oxycodone

## 2022-04-25 NOTE — Assessment & Plan Note (Signed)
Controlled.  Last A1c 6.4 on 12/31 Sliding scale insulin coverage while hospitalized

## 2022-04-25 NOTE — Assessment & Plan Note (Addendum)
Heart rate 122 on arrival in the setting of missing medication on the day of arrival Recently hospitalized from 12/31 through 04/20/2022 with rapid A-fib attributed to medication noncompliance, Improved with home dose of metoprolol Hold Eliquis while on heparin infusion.  Continue metoprolol Explore barriers to medication compliance.

## 2022-04-26 ENCOUNTER — Inpatient Hospital Stay: Payer: No Typology Code available for payment source

## 2022-04-26 ENCOUNTER — Inpatient Hospital Stay
Admit: 2022-04-26 | Discharge: 2022-04-26 | Disposition: A | Discharge status: Home / Self Care | Payer: No Typology Code available for payment source | Attending: Internal Medicine | Admitting: Internal Medicine

## 2022-04-26 DIAGNOSIS — R911 Solitary pulmonary nodule: Secondary | ICD-10-CM | POA: Insufficient documentation

## 2022-04-26 LAB — CBG MONITORING, ED
Glucose-Capillary: 112 mg/dL — ABNORMAL HIGH (ref 70–99)
Glucose-Capillary: 130 mg/dL — ABNORMAL HIGH (ref 70–99)
Glucose-Capillary: 138 mg/dL — ABNORMAL HIGH (ref 70–99)
Glucose-Capillary: 90 mg/dL (ref 70–99)
Glucose-Capillary: 98 mg/dL (ref 70–99)

## 2022-04-26 LAB — BASIC METABOLIC PANEL
Anion gap: 11 (ref 5–15)
BUN: 19 mg/dL (ref 8–23)
CO2: 26 mmol/L (ref 22–32)
Calcium: 9.4 mg/dL (ref 8.9–10.3)
Chloride: 100 mmol/L (ref 98–111)
Creatinine, Ser: 0.99 mg/dL (ref 0.61–1.24)
GFR, Estimated: >60 mL/min (ref >60)
Glucose, Bld: 127 mg/dL — ABNORMAL HIGH (ref 70–99)
Potassium: 3.8 mmol/L (ref 3.5–5.1)
Sodium: 137 mmol/L (ref 135–145)

## 2022-04-26 LAB — TROPONIN I (HIGH SENSITIVITY): Troponin I (High Sensitivity): 337 ng/L (ref <18)

## 2022-04-26 LAB — HEPARIN LEVEL (UNFRACTIONATED): Heparin Unfractionated: >1.1 IU/mL — ABNORMAL HIGH (ref 0.30–0.70)

## 2022-04-26 LAB — APTT
aPTT: 134 seconds — ABNORMAL HIGH (ref 24–36)
aPTT: 53 seconds — ABNORMAL HIGH (ref 24–36)
aPTT: 61 seconds — ABNORMAL HIGH (ref 24–36)

## 2022-04-26 MED ORDER — THIAMINE MONONITRATE 100 MG PO TABS
100.0000 mg | ORAL_TABLET | Freq: Every day | ORAL | Status: DC
  Administered 2022-04-26 – 2022-05-05 (×10): 100 mg via ORAL
  Filled 2022-04-26 (×11): qty 1

## 2022-04-26 MED ORDER — FOLIC ACID 1 MG PO TABS
1.0000 mg | ORAL_TABLET | Freq: Every day | ORAL | Status: DC
  Administered 2022-04-26 – 2022-05-05 (×10): 1 mg via ORAL
  Filled 2022-04-26 (×11): qty 1

## 2022-04-26 MED ORDER — ADULT MULTIVITAMIN W/MINERALS CH
1.0000 | ORAL_TABLET | Freq: Every day | ORAL | Status: DC
  Administered 2022-04-26 – 2022-04-28 (×3): 1 via ORAL
  Filled 2022-04-26 (×3): qty 1

## 2022-04-26 MED ORDER — LORAZEPAM 2 MG/ML PO CONC
1.0000 mg | ORAL | Status: DC | PRN
  Filled 2022-04-26: qty 0.5

## 2022-04-26 MED ORDER — HEPARIN BOLUS VIA INFUSION
2000.0000 [IU] | Freq: Once | INTRAVENOUS | Status: AC
  Administered 2022-04-26: 2000 [IU] via INTRAVENOUS
  Filled 2022-04-26: qty 2000

## 2022-04-26 MED ORDER — HEPARIN (PORCINE) 25000 UT/250ML-% IV SOLN
1000.0000 [IU]/h | INTRAVENOUS | Status: DC
  Administered 2022-04-26: 650 [IU]/h via INTRAVENOUS
  Administered 2022-04-26: 850 [IU]/h via INTRAVENOUS
  Filled 2022-04-26: qty 250

## 2022-04-26 MED ORDER — LORAZEPAM 1 MG PO TABS
1.0000 mg | ORAL_TABLET | ORAL | Status: DC | PRN
  Administered 2022-04-27: 4 mg via ORAL
  Administered 2022-04-27 – 2022-04-28 (×2): 1 mg via ORAL
  Filled 2022-04-26: qty 4
  Filled 2022-04-26 (×2): qty 1

## 2022-04-26 MED ORDER — THIAMINE HCL 100 MG/ML IJ SOLN
100.0000 mg | Freq: Every day | INTRAMUSCULAR | Status: DC
  Filled 2022-04-26 (×2): qty 2

## 2022-04-26 MED ORDER — HEPARIN BOLUS VIA INFUSION
1100.0000 [IU] | Freq: Once | INTRAVENOUS | Status: AC
  Administered 2022-04-26: 1100 [IU] via INTRAVENOUS
  Filled 2022-04-26: qty 1100

## 2022-04-26 MED ORDER — HEPARIN BOLUS VIA INFUSION
1000.0000 [IU] | Freq: Once | INTRAVENOUS | Status: DC
  Filled 2022-04-26: qty 1000

## 2022-04-26 MED ORDER — IOHEXOL 350 MG/ML SOLN
100.0000 mL | Freq: Once | INTRAVENOUS | Status: AC | PRN
  Administered 2022-04-26: 100 mL via INTRAVENOUS

## 2022-04-26 MED ORDER — LORAZEPAM 2 MG/ML IJ SOLN
1.0000 mg | INTRAMUSCULAR | Status: DC | PRN
  Administered 2022-04-26 – 2022-04-30 (×11): 1 mg via INTRAVENOUS
  Filled 2022-04-26 (×11): qty 1

## 2022-04-26 NOTE — ED Notes (Signed)
Per pharmacy heparin stopped x1 hr. To be resumed @ 0730

## 2022-04-26 NOTE — ED Notes (Signed)
Pt up to use urinal, pt wanted to stand at bedside. Pt is very unsteady on feet as earlier. Pt was able to void 246mL urine. Pt is now sitting on side of bed eating dinner.

## 2022-04-26 NOTE — ED Notes (Signed)
Pt eating breakfast with sister at bedside

## 2022-04-26 NOTE — Consult Note (Signed)
ANTICOAGULATION CONSULT NOTE - Initial Consult  Pharmacy Consult for heparin drip Indication: chest pain/ACS  Allergies  Allergen Reactions   Niacin Other (See Comments)   Penicillin G     Patient Measurements:   Heparin Dosing Weight: 74.4 kg  Vital Signs: Temp: 97.8 F (36.6 C) (01/07 1613) Temp Source: Oral (01/07 1613) BP: 136/84 (01/07 1800) Pulse Rate: 59 (01/07 1800)  Labs: Recent Labs    04/25/22 1642 04/25/22 2124 04/25/22 2124 04/26/22 0146 04/26/22 0550 04/26/22 1506 04/26/22 2216  HGB 11.8*  --   --   --   --   --   --   HCT 38.1*  --   --   --   --   --   --   PLT 289  --   --   --   --   --   --   APTT  --  40*   < >  --  134* 53* 61*  LABPROT  --  17.9*  --   --   --   --   --   INR  --  1.5*  --   --   --   --   --   HEPARINUNFRC  --  >1.10*  --   --  >1.10*  --   --   CREATININE 1.06  --   --  0.99  --   --   --   TROPONINIHS 245* 254*  --  337*  --   --   --    < > = values in this interval not displayed.     Estimated Creatinine Clearance: 74.8 mL/min (by C-G formula based on SCr of 0.99 mg/dL).   Medical History: Past Medical History:  Diagnosis Date   Asthma    Atrial fibrillation (HCC)    Bicuspid aortic valve    CHF (congestive heart failure) (HCC)    COPD (chronic obstructive pulmonary disease) ([place])    Depression    Diabetes mellitus without complication ([place])    Hypertension     Medications:  Patient takes Eliquis 5 mg po BID, provider reported last dose 1/5 in evening.  Assessment: 67 yo male with history of Afib brought in by EMS with elevated heart rate.  Pharmacy consulted to start heparin drip for possible NSTEMI.  Goal of Therapy:  aPTT 66-102 seconds Heparin level 0.3-0.7 Monitor platelets by anticoagulation protocol: Yes   Plan:  1/7 @  2216: aPTT = 61 sec Will order heparin 1100 units IV X 1 bolus and increase drip rate to 1000 units/hr.  Recheck aPTT in 6 hours Use aPTT to titrate until correlation with  heparin levels Will recheck HL and aPTT on 1/8 with AM labs.  Daily CBC while on heparin drip  Karris Deangelo D, PharmD 04/26/2022,10:50 PM

## 2022-04-26 NOTE — Progress Notes (Signed)
Progress Note   Patient: Jason Beck NKN:[id number] DOB: [birth year] DOA: 04/25/2022     1 DOS: the patient was seen and examined on 04/26/2022   Brief hospital course:  67 y.o. 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  He complains of intermittent sharp right-sided chest pain.  Denies lower extremity edema or orthopnea.  He is compliant with his Eliquis.  Has had no cough, fever or chills.  States he felt so poorly he did not take his medications today.  He was brought in by EMS who reported a heart rate of 80-1 30 with otherwise normal vitals.    ED course and data review: On arrival, afebrile BP 112/97 with pulse 122 and otherwise normal vitals.  Troponin 245 with BNP 791.9.  WBC and respiratory viral panel negative for COVID flu and RSV.  Hemoglobin at baseline at 11.8.  BMP unremarkable.  EKG, personally viewed and interpreted showing A-fib at 122 with nonspecific ST-T wave changes. Chest x-ray showed mild cardiomegaly and pulmonary vascular congestion.  No focal consolidation or large pleural effusion. Patient was administered his home metoprolol with improvement in heart rate to 99 by admission.  He was also treated with a dose of IV Lasix for possible CHF and started on heparin infusion for possible NSTEMI.    1/7 : Patient had mild uptrend in high-sensitivity troponins however asymptomatic with no chest pain or palpitation/diaphoresis.  Cardiology consulted by the admitting  physician pending eval.   Assessment and Plan: * NSTEMI (non-ST elevated myocardial infarction) [facility]) Patient presents with fatigue and dyspnea on exertion with troponin 245  Has right sided chest pain and with nonacute EKG Was ruled out for MI after presenting as code STEMI in December with troponin peaks in the 500s.  No cath done Will treat as type I NSTEMI though possibility could be demand ischemia Continue heparin infusion Continue metoprolol, lisinopril and atorvastatin.  Not on antiplatelets Limited echo to evaluate for wall motion abnormality Cardiology consult-pending  Acute on chronic combined systolic and diastolic CHF (congestive heart failure) (HCC) Dyspnea on exertion with BNP 791 and pulmonary vascular congestion on chest x-ray Last echo from 12/2 shows EF 40 to 45% with G1 DD IV Lasix Continue metoprolol and lisinopril Daily weights with intake and output monitoring  Paroxysmal atrial fibrillation with RVR - Rate normal  Heart rate 122 on arrival in the setting of missing medication on the day of arrival Recently hospitalized from 12/31 through 04/20/2022 with rapid A-fib attributed to medication noncompliance, Improved with home dose of metoprolol Hold Eliquis while on heparin infusion.  Continue metoprolol Explore barriers to medication compliance.  Type 2 diabetes mellitus with peripheral neuropathy (HCC) Controlled.  Last A1c 6.4 on 12/31 Sliding scale insulin coverage while hospitalized  Tobacco use disorder Nicotine patch  Alcohol use disorder, in remission 12 pack beer daily, quit cold [place] a week prior and has no  withdrawals CIWA   PVD (peripheral vascular disease) (HCC) Continue aspirin and atorvastatin History of amputation digits right  foot  History of lung cancer S/p radiation  at the [facility] 2019 No acute issues suspected  Chronic pain Continue methadone and oxycodone  Essential hypertension Continue lisinopril and metoprolol  Chronic  obstructive pulmonary disease (COPD) ([place]) Not acutely exacerbated Continue home inhalers DuoNebs as needed     Subjective: Patient seen and examined this morning.  Vital labs and imaging reviewed.  Patient reports improvement in symptoms with no complaint of chest pain/chest tightness/diaphoresis or palpitations.  Troponins mildly uptrending cardiology consult pending  Physical Exam: Vitals:   04/26/22 1330 04/26/22 1400 04/26/22 1430 04/26/22 1500  BP: 123/77 (!) 101/53 136/74 125/81  Pulse: 77 78 88 (!) 54  Resp:      Temp:      TempSrc:      SpO2: 98% 97% 100% 94%   Physical Exam Constitutional:      Appearance: Normal appearance.  HENT:     Head: Normocephalic.     Nose: Nose normal.  Eyes:     Pupils: Pupils are equal, round, and reactive to light.  Cardiovascular:     Rate and Rhythm: Tachycardia present. Rhythm irregular.     Pulses: Normal pulses.  Pulmonary:     Effort: Pulmonary effort is normal.  Abdominal:     Palpations: Abdomen is soft.  Musculoskeletal:        General: Normal range of motion.     Cervical back: Normal range of motion.  Skin:    General: Skin is warm.     Capillary Refill: Capillary refill takes less than 2 seconds.  Neurological:     General: No focal deficit present.     Mental Status: He is alert.  Psychiatric:        Mood and Affect: Mood normal.     Data Reviewed:    Family Communication: None by bedside Disposition: Status is: Inpatient Remains inpatient appropriate because: NTEMI   Planned Discharge Destination: Home    Time spent: 32 minutes  Author: Oran Rein, MD 04/26/2022 3:47 PM  For on call review [email or web address].

## 2022-04-26 NOTE — Consult Note (Signed)
ANTICOAGULATION CONSULT NOTE - Initial Consult  Pharmacy Consult for heparin drip Indication: chest pain/ACS  Allergies  Allergen Reactions   Niacin Other (See Comments)   Penicillin G     Patient Measurements:   Heparin Dosing Weight: 74.4 kg  Vital Signs: Temp: 97.5 F (36.4 C) (01/07 0040) Temp Source: Oral (01/07 0040) BP: 143/86 (01/07 0530) Pulse Rate: 71 (01/07 0530)  Labs: Recent Labs    04/25/22 1642 04/25/22 2124 04/26/22 0146 04/26/22 0550  HGB 11.8*  --   --   --   HCT 38.1*  --   --   --   PLT 289  --   --   --   APTT  --  40*  --  134*  LABPROT  --  17.9*  --   --   INR  --  1.5*  --   --   HEPARINUNFRC  --  >1.10*  --  >1.10*  CREATININE 1.06  --  0.99  --   TROPONINIHS 245* 254* 337*  --      Estimated Creatinine Clearance: 74.8 mL/min (by C-G formula based on SCr of 0.99 mg/dL).   Medical History: Past Medical History:  Diagnosis Date   Asthma    Atrial fibrillation (HCC)    Bicuspid aortic valve    CHF (congestive heart failure) (HCC)    COPD (chronic obstructive pulmonary disease) ([place])    Depression    Diabetes mellitus without complication ([place])    Hypertension     Medications:  Patient takes Eliquis 5 mg po BID, provider reported last dose 1/5 in evening.  Assessment: 67 yo male with history of Afib brought in by EMS with elevated heart rate.  Pharmacy consulted to start heparin drip for possible NSTEMI.  Goal of Therapy:  aPTT 66-102 seconds Heparin level 0.3-0.7 Monitor platelets by anticoagulation protocol: Yes   Plan:  1/7 @ 0550:  aPTT = 134,   HL = > 1.10 Will hold heparin drip for 1 hr and restart at 650 units/hr Will recheck aPTT 6 hrs after restart  Will recheck HL on 1/8 with AM labs.  Daily CBC while on heparin drip  Blessed Girdner D 04/26/2022,6:33 AM

## 2022-04-26 NOTE — Consult Note (Signed)
ANTICOAGULATION CONSULT NOTE - Initial Consult  Pharmacy Consult for heparin drip Indication: chest pain/ACS  Allergies  Allergen Reactions   Niacin Other (See Comments)   Penicillin G     Patient Measurements:   Heparin Dosing Weight: 74.4 kg  Vital Signs: BP: 125/81 (01/07 1500) Pulse Rate: 54 (01/07 1500)  Labs: Recent Labs    04/25/22 1642 04/25/22 2124 04/26/22 0146 04/26/22 0550 04/26/22 1506  HGB 11.8*  --   --   --   --   HCT 38.1*  --   --   --   --   PLT 289  --   --   --   --   APTT  --  40*  --  134* 53*  LABPROT  --  17.9*  --   --   --   INR  --  1.5*  --   --   --   HEPARINUNFRC  --  >1.10*  --  >1.10*  --   CREATININE 1.06  --  0.99  --   --   TROPONINIHS 245* 254* 337*  --   --      Estimated Creatinine Clearance: 74.8 mL/min (by C-G formula based on SCr of 0.99 mg/dL).   Medical History: Past Medical History:  Diagnosis Date   Asthma    Atrial fibrillation (HCC)    Bicuspid aortic valve    CHF (congestive heart failure) (HCC)    COPD (chronic obstructive pulmonary disease) ([place])    Depression    Diabetes mellitus without complication ([place])    Hypertension     Medications:  Patient takes Eliquis 5 mg po BID, provider reported last dose 1/5 in evening.  Assessment: 67 yo male with history of Afib brought in by EMS with elevated heart rate.  Pharmacy consulted to start heparin drip for possible NSTEMI.  Goal of Therapy:  aPTT 66-102 seconds Heparin level 0.3-0.7 Monitor platelets by anticoagulation protocol: Yes   Plan:  1/7 @ 1506: aPTT = 53 sec Give heparin 2000 unit bolus x 1 Increase heparin drip to 850 unit/hr Recheck aPTT in 6 hours Use aPTT to titrate until correlation with heparin levels Will recheck HL on 1/8 with AM labs.  Daily CBC while on heparin drip  Lorin Picket, PharmD 04/26/2022,3:40 PM

## 2022-04-26 NOTE — ED Notes (Signed)
Pt up to restroom, 1 person assist required, pt is unsteady on feet

## 2022-04-27 ENCOUNTER — Inpatient Hospital Stay
Admit: 2022-04-27 | Discharge: 2022-04-27 | Disposition: A | Discharge status: Home / Self Care | Payer: No Typology Code available for payment source | Attending: Internal Medicine | Admitting: Internal Medicine

## 2022-04-27 ENCOUNTER — Encounter: Payer: Self-pay | Admitting: Internal Medicine

## 2022-04-27 ENCOUNTER — Encounter
Admission: EM | Disposition: A | Discharge status: Home Health | Payer: No Typology Code available for payment source | Source: Home / Self Care | Attending: Internal Medicine

## 2022-04-27 DIAGNOSIS — J810 Acute pulmonary edema: Secondary | ICD-10-CM

## 2022-04-27 DIAGNOSIS — I482 Chronic atrial fibrillation, unspecified: Secondary | ICD-10-CM

## 2022-04-27 DIAGNOSIS — I214 Non-ST elevation (NSTEMI) myocardial infarction: Secondary | ICD-10-CM | POA: Diagnosis not present

## 2022-04-27 HISTORY — PX: LEFT HEART CATH AND CORONARY ANGIOGRAPHY: CATH118249

## 2022-04-27 HISTORY — PX: INTRAVASCULAR PRESSURE WIRE/FFR STUDY: CATH118243

## 2022-04-27 LAB — CBC
HCT: 35.1 % — ABNORMAL LOW (ref 39.0–52.0)
Hemoglobin: 11.1 g/dL — ABNORMAL LOW (ref 13.0–17.0)
MCH: 26.5 pg (ref 26.0–34.0)
MCHC: 31.6 g/dL (ref 30.0–36.0)
MCV: 83.8 fL (ref 80.0–100.0)
Platelets: 225 10*3/uL (ref 150–400)
RBC: 4.19 MIL/uL — ABNORMAL LOW (ref 4.22–5.81)
RDW: 16.1 % — ABNORMAL HIGH (ref 11.5–15.5)
WBC: 6.2 10*3/uL (ref 4.0–10.5)
nRBC: 0 % (ref 0.0–0.2)

## 2022-04-27 LAB — BASIC METABOLIC PANEL
Anion gap: 11 (ref 5–15)
BUN: 18 mg/dL (ref 8–23)
CO2: 28 mmol/L (ref 22–32)
Calcium: 9.1 mg/dL (ref 8.9–10.3)
Chloride: 98 mmol/L (ref 98–111)
Creatinine, Ser: 0.91 mg/dL (ref 0.61–1.24)
GFR, Estimated: >60 mL/min (ref >60)
Glucose, Bld: 95 mg/dL (ref 70–99)
Potassium: 3.2 mmol/L — ABNORMAL LOW (ref 3.5–5.1)
Sodium: 137 mmol/L (ref 135–145)

## 2022-04-27 LAB — ECHOCARDIOGRAM COMPLETE
AR max vel: 0.74 cm2
AV Area VTI: 0.74 cm2
AV Area mean vel: 0.77 cm2
AV Mean grad: 15 mmHg
AV Peak grad: 27 mmHg
Ao pk vel: 2.6 m/s
Area-P 1/2: 4.68 cm2
MV VTI: 0.89 cm2
S' Lateral: 4.2 cm

## 2022-04-27 LAB — POCT ACTIVATED CLOTTING TIME
Activated Clotting Time: 260 seconds
Activated Clotting Time: 195 seconds

## 2022-04-27 LAB — GLUCOSE, CAPILLARY
Glucose-Capillary: 109 mg/dL — ABNORMAL HIGH (ref 70–99)
Glucose-Capillary: 102 mg/dL — ABNORMAL HIGH (ref 70–99)
Glucose-Capillary: 142 mg/dL — ABNORMAL HIGH (ref 70–99)

## 2022-04-27 LAB — CBG MONITORING, ED: Glucose-Capillary: 106 mg/dL — ABNORMAL HIGH (ref 70–99)

## 2022-04-27 LAB — APTT: aPTT: 66 seconds — ABNORMAL HIGH (ref 24–36)

## 2022-04-27 LAB — HEPARIN LEVEL (UNFRACTIONATED): Heparin Unfractionated: >1.1 IU/mL — ABNORMAL HIGH (ref 0.30–0.70)

## 2022-04-27 SURGERY — LEFT HEART CATH AND CORONARY ANGIOGRAPHY
Anesthesia: Moderate Sedation

## 2022-04-27 MED ORDER — NITROGLYCERIN 0.4 MG SL SUBL
SUBLINGUAL_TABLET | SUBLINGUAL | Status: AC
  Filled 2022-04-27: qty 1

## 2022-04-27 MED ORDER — AMIODARONE HCL IN DEXTROSE 360-4.14 MG/200ML-% IV SOLN
60.0000 mg/h | INTRAVENOUS | Status: DC
  Administered 2022-04-27: 60 mg/h via INTRAVENOUS
  Filled 2022-04-27 (×2): qty 200

## 2022-04-27 MED ORDER — HEPARIN SODIUM (PORCINE) 1000 UNIT/ML IJ SOLN
INTRAMUSCULAR | Status: AC
  Filled 2022-04-27: qty 10

## 2022-04-27 MED ORDER — HEPARIN SODIUM (PORCINE) 1000 UNIT/ML IJ SOLN: INTRAMUSCULAR | Status: DC | PRN

## 2022-04-27 MED ORDER — SODIUM CHLORIDE 0.9% FLUSH: 3.0000 mL | INTRAVENOUS | Status: DC | PRN

## 2022-04-27 MED ORDER — ASPIRIN 81 MG PO CHEW
CHEWABLE_TABLET | ORAL | Status: AC
  Administered 2022-04-27: 81 mg via ORAL
  Filled 2022-04-27: qty 1

## 2022-04-27 MED ORDER — SODIUM CHLORIDE 0.9% FLUSH
3.0000 mL | Freq: Two times a day (BID) | INTRAVENOUS | Status: DC
  Administered 2022-04-27 – 2022-05-05 (×15): 3 mL via INTRAVENOUS

## 2022-04-27 MED ORDER — IOHEXOL 300 MG/ML  SOLN
INTRAMUSCULAR | Status: DC | PRN
  Administered 2022-04-27: 94 mL

## 2022-04-27 MED ORDER — POTASSIUM CHLORIDE CRYS ER 20 MEQ PO TBCR
40.0000 meq | EXTENDED_RELEASE_TABLET | Freq: Two times a day (BID) | ORAL | Status: AC
  Administered 2022-04-27 (×2): 40 meq via ORAL
  Filled 2022-04-27 (×2): qty 2

## 2022-04-27 MED ORDER — HEPARIN SODIUM (PORCINE) 1000 UNIT/ML IJ SOLN
INTRAMUSCULAR | Status: DC | PRN
  Administered 2022-04-27: 7000 [IU] via INTRAVENOUS

## 2022-04-27 MED ORDER — FENTANYL CITRATE (PF) 100 MCG/2ML IJ SOLN
INTRAMUSCULAR | Status: DC | PRN
  Administered 2022-04-27: 25 ug via INTRAVENOUS

## 2022-04-27 MED ORDER — SODIUM CHLORIDE 0.9% FLUSH
3.0000 mL | INTRAVENOUS | Status: DC | PRN
  Administered 2022-05-04: 3 mL via INTRAVENOUS

## 2022-04-27 MED ORDER — HEPARIN (PORCINE) 25000 UT/250ML-% IV SOLN
1000.0000 [IU]/h | INTRAVENOUS | Status: DC
  Administered 2022-04-27: 1000 [IU]/h via INTRAVENOUS

## 2022-04-27 MED ORDER — MIDAZOLAM HCL 2 MG/2ML IJ SOLN
INTRAMUSCULAR | Status: AC
  Filled 2022-04-27: qty 2

## 2022-04-27 MED ORDER — NITROGLYCERIN 0.4 MG SL SUBL
SUBLINGUAL_TABLET | SUBLINGUAL | Status: DC | PRN
  Administered 2022-04-27: .4 mg via SUBLINGUAL

## 2022-04-27 MED ORDER — SODIUM CHLORIDE 0.9 % IV SOLN: INTRAVENOUS | Status: DC

## 2022-04-27 MED ORDER — FENTANYL CITRATE (PF) 100 MCG/2ML IJ SOLN
INTRAMUSCULAR | Status: AC
  Filled 2022-04-27: qty 2

## 2022-04-27 MED ORDER — HEPARIN (PORCINE) IN NACL 1000-0.9 UT/500ML-% IV SOLN
INTRAVENOUS | Status: DC | PRN
  Administered 2022-04-27: 1000 mL

## 2022-04-27 MED ORDER — MIDAZOLAM HCL 2 MG/2ML IJ SOLN
INTRAMUSCULAR | Status: DC | PRN
  Administered 2022-04-27 (×2): 1 mg via INTRAVENOUS

## 2022-04-27 MED ORDER — ASPIRIN 81 MG PO CHEW: 81.0000 mg | CHEWABLE_TABLET | ORAL | Status: AC

## 2022-04-27 MED ORDER — LORAZEPAM 2 MG/ML IJ SOLN
INTRAMUSCULAR | Status: AC
  Administered 2022-04-27: 1 mg via INTRAVENOUS
  Filled 2022-04-27: qty 1

## 2022-04-27 MED ORDER — SODIUM CHLORIDE 0.9% FLUSH
3.0000 mL | Freq: Two times a day (BID) | INTRAVENOUS | Status: DC
  Administered 2022-04-27 (×2): 3 mL via INTRAVENOUS

## 2022-04-27 MED ORDER — SODIUM CHLORIDE 0.9 % IV SOLN: 250.0000 mL | INTRAVENOUS | Status: DC | PRN

## 2022-04-27 MED ORDER — AMIODARONE HCL IN DEXTROSE 360-4.14 MG/200ML-% IV SOLN
30.0000 mg/h | INTRAVENOUS | Status: DC
  Administered 2022-04-27 – 2022-04-29 (×4): 30 mg/h via INTRAVENOUS
  Filled 2022-04-27 (×3): qty 200

## 2022-04-27 MED ORDER — HEPARIN (PORCINE) IN NACL 1000-0.9 UT/500ML-% IV SOLN
INTRAVENOUS | Status: AC
  Filled 2022-04-27: qty 1000

## 2022-04-27 MED ORDER — FENTANYL CITRATE (PF) 100 MCG/2ML IJ SOLN
INTRAMUSCULAR | Status: DC | PRN
  Administered 2022-04-27: 50 ug via INTRAVENOUS

## 2022-04-27 MED ORDER — SODIUM CHLORIDE 0.9 % IV SOLN: INTRAVENOUS | Status: AC

## 2022-04-27 SURGICAL SUPPLY — 20 items
CATH INFINITI 5FR ANG PIGTAIL (CATHETERS) IMPLANT
CATH INFINITI 5FR JL4 (CATHETERS) IMPLANT
CATH INFINITI JR4 5F (CATHETERS) IMPLANT
CATH LAUNCHER 6FR AR1 (CATHETERS) IMPLANT
GUIDEWIRE INQWIRE 1.5J.035X260 (WIRE) IMPLANT
GUIDEWIRE PRESSURE X 175 (WIRE) IMPLANT
INQWIRE 1.5J .035X260CM (WIRE)
KIT ENCORE 26 ADVANTAGE (KITS) IMPLANT
NDL PERC 18GX7CM (NEEDLE) IMPLANT
NEEDLE PERC 18GX7CM (NEEDLE) ×1 IMPLANT
PACK CARDIAC CATH (CUSTOM PROCEDURE TRAY) ×1 IMPLANT
PROTECTION STATION PRESSURIZED (MISCELLANEOUS) ×1
SET ATX SIMPLICITY (MISCELLANEOUS) IMPLANT
SHEATH AVANTI 5FR X 11CM (SHEATH) IMPLANT
SHEATH AVANTI 6FR X 11CM (SHEATH) IMPLANT
STATION PROTECTION PRESSURIZED (MISCELLANEOUS) IMPLANT
SUT SILK 0 FSL (SUTURE) IMPLANT
TUBING CIL FLEX 10 FLL-RA (TUBING) IMPLANT
WIRE ASAHI PROWATER 180CM (WIRE) IMPLANT
WIRE GUIDERIGHT .035X150 (WIRE) IMPLANT

## 2022-04-27 NOTE — Progress Notes (Signed)
Echocardiogram shows moderate to severe LV dysfunction with left ventricular ejection fraction 36% and moderate to severe aortic stenosis.  Mean aortic gradient was 20 mmHg but patient has severe LV dysfunction and may have low flows aortic stenosis and is probably severe aortic stenosis.  Chest pain may be related to aortic stenosis since no significant disease on cardiac catheterization was found.  Will try to convert the patient to sinus rhythm by IV amiodarone and if symptoms improve evaluate for aortic valve replacement.

## 2022-04-27 NOTE — Progress Notes (Signed)
*  PRELIMINARY RESULTS* Echocardiogram 2D Echocardiogram has been performed.  Jason Beck 04/27/2022, 9:02 AM

## 2022-04-27 NOTE — Progress Notes (Signed)
Progress Note   Patient: Jason Beck JXB:[id number] DOB: 03/12/1956 DOA: 04/25/2022     2 DOS: the patient was seen and examined on 04/27/2022   Brief hospital course: 67 y.o. 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  He complains of intermittent sharp right-sided chest pain.  Denies lower extremity edema or orthopnea.  He is compliant with his Eliquis.  Has had no cough, fever or chills.  States he felt so poorly he did not take his medications today.  He was brought in by EMS who reported a heart rate of 80-1 30 with otherwise normal vitals.    ED course and data review: On arrival, afebrile BP 112/97 with pulse 122 and otherwise normal vitals.  Troponin 245 with BNP 791.9.  WBC and respiratory viral panel negative for COVID flu and RSV.  Hemoglobin at baseline at 11.8.  BMP unremarkable.  EKG, personally viewed and interpreted showing A-fib at 122 with nonspecific ST-T wave changes. Chest x-ray showed mild cardiomegaly and pulmonary vascular congestion.  No focal consolidation or large pleural effusion. Patient was administered his home metoprolol with improvement in heart rate to 99 by admission.  He was also treated with a dose of IV Lasix for possible CHF and started on heparin infusion for possible NSTEMI.    1/7 : Patient had mild uptrend in high-sensitivity troponins however asymptomatic with no chest pain or palpitation/diaphoresis.    1/8- remains hemodynamically stable.  Cardiology planning cath  Assessment and Plan: NSTEMI-  Patient presents with fatigue and dyspnea on exertion with troponin 245  Has right sided chest pain and with nonacute EKG - cardiology consulted, appreciate your care  - cath 1/8 - Continue heparin infusion - Continue metoprolol, lisinopril and atorvastatin.  Not on antiplatelets - f/u repeat echo  HFrEF- echo from 12/2 shows EF 40 to 45% with G1 DD Dyspnea on exertion with BNP 791 and pulmonary vascular congestion on chest x-ray IV Lasix Continue metoprolol and lisinopril Daily weights with intake and output monitoring  Paroxysmal atrial fibrillation with RVR - Rate normal  Heart rate 122 on arrival in the setting of missing medication on the day of arrival Recently hospitalized from 12/31 through 04/20/2022 with rapid A-fib attributed to medication noncompliance, Improved with home dose of metoprolol Hold Eliquis while on heparin infusion.  Continue metoprolol Explore barriers to medication compliance. Receives his care at [facility] hospital.   Type 2 diabetes mellitus with peripheral neuropathy ([place]) Controlled.  Last A1c 6.4 on 12/31 Sliding scale insulin coverage while hospitalized  Tobacco use disorder Nicotine patch  Alcohol use disorder, in remission 12 pack beer daily, quit cold [place] a week prior and has no  withdrawals CIWA  PVD (peripheral vascular disease) (HCC) Continue aspirin and atorvastatin History of amputation digits right foot  History of lung cancer- cta positive for Spiculated mixed solid and cystic nodule within the anterior left upper lobe measuring 18 mm, indeterminate. Will need follow up imaging in 3 months or sooner. Patient receives his oncology care  through [place]. Unsure of the significance of the current findings vs what has been present on monitoring images prior to admission due to not having access to [place] chart. Discussed the findings with patient and his sister at bedside and recommended they  discuss with oncology at follow up appointment. They endorsed understanding and agreement.  S/p radiation  at the [facility] 2019 No acute issues suspected - continue oxygen PRN  Chronic pain Continue methadone and oxycodone  Essential hypertension Continue lisinopril and metoprolol  COPD- Not acutely exacerbated Continue home inhalers DuoNebs as needed  Subjective: Patient seen and examined this morning.  Vital labs and imaging reviewed.  Patient reports improvement in symptoms with no complaint of chest pain/chest tightness/diaphoresis or palpitations. He feels "apprehensive" or anxious about the procedure planned today. He is also tired as he just received his pain medication. Sister at bedside has several questions regarding mainly his follow up. All were addressed at time of encounter.   Physical Exam: Vitals:   04/27/22 0230 04/27/22 0300 04/27/22 0318 04/27/22 0700  BP: (!) 142/53 129/71  136/78  Pulse: 73 83  (!) 53  Resp: 18 18    Temp:   98.4 F (36.9 C)   TempSrc:   Oral   SpO2: 99% 98%  97%   Physical Exam Constitutional:      Appearance: Normal appearance.  HENT:     Head: Normocephalic.     Nose: Nose normal.  Eyes:     Pupils: Pupils are equal, round, and reactive to light.  Cardiovascular:     Rate and Rhythm: Tachycardia present. Rhythm irregular.     Pulses: Normal pulses.  Pulmonary:     Effort: Pulmonary effort is normal.  Abdominal:     Palpations: Abdomen is soft.  Musculoskeletal:        General: Normal range of motion.     Cervical back: Normal range of motion.  Skin:    General: Skin is warm.     Capillary Refill: Capillary refill takes less than 2 seconds.  Neurological:     General: No focal deficit present.     Mental Status: He is alert.  Psychiatric:        Mood and Affect: Mood normal.     Data Reviewed: CBC    Component Value Date/Time   WBC 6.2 04/27/2022 0529   RBC 4.19 (L) 04/27/2022 0529   HGB 11.1 (L) 04/27/2022 0529   HCT  35.1 (L) 04/27/2022 0529   PLT 225 04/27/2022 0529   MCV 83.8 04/27/2022 0529   MCH 26.5 04/27/2022 0529   MCHC 31.6 04/27/2022 0529   RDW 16.1 (H) 04/27/2022 0529   LYMPHSABS 2.5 04/19/2022 0105   MONOABS 0.7 04/19/2022 0105   EOSABS 0.2 04/19/2022 0105   BASOSABS 0.0 04/19/2022 0105      Latest Ref Rng & Units 04/27/2022    5:29 AM 04/26/2022    1:46 AM 04/25/2022    4:42 PM  BMP  Glucose 70 - 99 mg/dL 95  397  673   BUN 8 - 23 mg/dL 18  19  20    Creatinine 0.61 - 1.24 mg/dL  4.19  3.79   Sodium 135 - 145 mmol/L 137  137  137   Potassium 3.5 - 5.1 mmol/L 3.2  3.8  3.8   Chloride 98 - 111 mmol/L 98  100  102   CO2 22 - 32 mmol/L 28  26  26    Calcium 8.9 - 10.3 mg/dL  9.1  9.4  9.2     Family Communication: sister at bedside Disposition: Status is: Inpatient Remains inpatient appropriate because: NTEMI   Planned Discharge Destination: Home  Time spent: 32 minutes  Author: Leeroy Bock, MD 04/27/2022 7:40 AM  For on call review [email or web address].

## 2022-04-27 NOTE — Discharge Instructions (Signed)

## 2022-04-27 NOTE — Consult Note (Signed)
Jason Beck is a 67 y.o. male  [id number]  Primary Cardiologist: [facility] cardiology Reason for Consultation: Atrial fibrillation and non-STEMI  HPI: This is a 67 year old white male with a past medical history of hypertension diabetes CHF bicuspid aortic valve and who had new onset atrial fibrillation diagnosed on 22 December last year and was admitted with elevated troponin and chest pain.  He now presents again with similar episodes with chest pain palpitation and found to be in atrial fibrillation with rapid ventricular response rate.  His troponins is on 320.  He continues to have intermittent chest pain.   Review of Systems: No orthopnea PND or leg swelling   Past Medical History:  Diagnosis Date   Asthma    Atrial fibrillation (HCC)    Bicuspid aortic valve    CHF (congestive heart failure) (HCC)    COPD (chronic obstructive pulmonary disease) (HCC)    Depression    Diabetes mellitus without complication (HCC)    Hypertension     (Not in a hospital admission)     atorvastatin  80 mg Oral Daily   clopidogrel  75 mg Oral Daily   folic acid  1 mg Oral Daily   furosemide  40 mg Intravenous BID   insulin aspart  0-5 Units Subcutaneous QHS   insulin aspart  0-9 Units Subcutaneous TID WC   lisinopril  5 mg Oral Daily   methadone  5 mg Oral QHS   metoprolol tartrate  50 mg Oral BID   mometasone-formoterol  2 puff Inhalation BID   multivitamin with minerals  1 tablet Oral Daily   nicotine  21 mg Transdermal Daily   thiamine  100 mg Oral Daily   Or   thiamine  100 mg Intravenous Daily   tiotropium  18 mcg Inhalation Daily    Infusions:  heparin 1,000 Units/hr (04/26/22 2309)    Allergies  Allergen Reactions   Niacin Other (See Comments)   Penicillin G     Social History   Socioeconomic History   Marital status: Married    Spouse name: Not on file   Number of children: Not on file   Years of education: Not on file   Highest education level: Not on file   Occupational History   Not on file  Tobacco Use   Smoking status: Every Day    Packs/day: 1.50    Types: Cigarettes   Smokeless tobacco: Never  Substance and Sexual Activity   Alcohol use: Yes    Alcohol/week: 2.0 - 3.0 standard drinks of alcohol    Types: 2 - 3 Cans of beer per week    Comment: daily   Drug use: Yes    Types: Marijuana   Sexual activity: Not on file  Other Topics Concern   Not on file  Social History Narrative   Not on file   Social Determinants of Health   Financial Resource Strain: Not on file  Food Insecurity: No Food Insecurity (04/27/2022)   Hunger Vital Sign    Worried About Running Out of Food in the Last Year: Never true    Ran Out of Food in the Last Year: Never true  Transportation Needs: No Transportation Needs (04/27/2022)   PRAPARE - Administrator, Civil Service (Medical): No    Lack of Transportation (Non-Medical): No  Physical Activity: Not on file  Stress: Not on file  Social Connections: Not on file  Intimate Partner Violence: Not At Risk (04/27/2022)  Humiliation, Afraid, Rape, and Kick questionnaire    Fear of Current or Ex-Partner: No    Emotionally Abused: No    Physically Abused: No    Sexually Abused: No    History reviewed. No pertinent family history.  PHYSICAL EXAM: Vitals:   04/27/22 0318 04/27/22 0700  BP:  136/78  Pulse:  (!) 53  Resp:    Temp: 98.4 F (36.9 C)   SpO2:  97%     Intake/Output Summary (Last 24 hours) at 04/27/2022 0817 Last data filed at 04/27/2022 0805 Gross per 24 hour  Intake --  Output 300 ml  Net -300 ml    General:  Well appearing. No respiratory difficulty HEENT: normal Neck: supple. no JVD. Carotids 2+ bilat; no bruits. No lymphadenopathy or thryomegaly appreciated. Cor: PMI nondisplaced. Regular rate & rhythm. No rubs, gallops or murmurs. Lungs: clear Abdomen: soft, nontender, nondistended. No hepatosplenomegaly. No bruits or masses. Good bowel sounds. Extremities: no  cyanosis, clubbing, rash, edema Neuro: alert & oriented x 3, cranial nerves grossly intact. moves all 4 extremities w/o difficulty. Affect pleasant.  ECG: Atrial fibrillation with nonspecific ST-T changes Results for orders placed or performed during the hospital encounter of 04/25/22 (from the past 24 hour(s))  CBG monitoring, ED     Status: None   Collection Time: 04/26/22  8:49 AM  Result Value Ref Range   Glucose-Capillary 90 70 - 99 mg/dL  CBG monitoring, ED     Status: Abnormal   Collection Time: 04/26/22 12:04 PM  Result Value Ref Range   Glucose-Capillary 138 (H) 70 - 99 mg/dL  APTT     Status: Abnormal   Collection Time: 04/26/22  3:06 PM  Result Value Ref Range   aPTT 53 (H) 24 - 36 seconds  CBG monitoring, ED     Status: None   Collection Time: 04/26/22  5:02 PM  Result Value Ref Range   Glucose-Capillary 98 70 - 99 mg/dL  APTT     Status: Abnormal   Collection Time: 04/26/22 10:16 PM  Result Value Ref Range   aPTT 61 (H) 24 - 36 seconds  CBG monitoring, ED     Status: Abnormal   Collection Time: 04/26/22 10:19 PM  Result Value Ref Range   Glucose-Capillary 112 (H) 70 - 99 mg/dL  Basic metabolic panel     Status: Abnormal   Collection Time: 04/27/22  5:29 AM  Result Value Ref Range   Sodium 137 135 - 145 mmol/L   Potassium 3.2 (L) 3.5 - 5.1 mmol/L   Chloride 98 98 - 111 mmol/L   CO2 28 22 - 32 mmol/L   Glucose, Bld 95 70 - 99 mg/dL   BUN 18 8 - 23 mg/dL   Creatinine, Ser 7.67 0.61 - 1.24 mg/dL   Calcium 9.1 8.9 - 34.1 mg/dL   GFR, Estimated >93 >79 mL/min   Anion gap 11 5 - 15  Heparin level (unfractionated)     Status: Abnormal   Collection Time: 04/27/22  5:29 AM  Result Value Ref Range   Heparin Unfractionated >1.10 (H) 0.30 - 0.70 IU/mL  CBC     Status: Abnormal   Collection Time: 04/27/22  5:29 AM  Result Value Ref Range   WBC 6.2 4.0 - 10.5 K/uL   RBC 4.19 (L) 4.22 - 5.81 MIL/uL   Hemoglobin 11.1 (L) 13.0 - 17.0 g/dL   HCT 02.4 (L) 09.7 - 35.3 %    MCV 83.8 80.0 - 100.0 fL  MCH 26.5 26.0 - 34.0 pg   MCHC 31.6 30.0 - 36.0 g/dL   RDW 16.1 (H) 11.5 - 15.5 %   Platelets 225 150 - 400 K/uL   nRBC 0.0 0.0 - 0.2 %  APTT     Status: Abnormal   Collection Time: 04/27/22  5:29 AM  Result Value Ref Range   aPTT 66 (H) 24 - 36 seconds  CBG monitoring, ED     Status: Abnormal   Collection Time: 04/27/22  7:48 AM  Result Value Ref Range   Glucose-Capillary 106 (H) 70 - 99 mg/dL   CT Angio Chest Pulmonary Embolism (PE) W or WO Contrast  Result Date: 04/26/2022 CLINICAL DATA:  Dyspnea, chronic, chest wall or pleura disease suspected EXAM: CT ANGIOGRAPHY CHEST WITH CONTRAST TECHNIQUE: Multidetector CT imaging of the chest was performed using the standard protocol during bolus administration of intravenous contrast. Multiplanar CT image reconstructions and MIPs were obtained to evaluate the vascular anatomy. RADIATION DOSE REDUCTION: This exam was performed according to the departmental dose-optimization program which includes automated exposure control, adjustment of the mA and/or kV according to patient size and/or use of iterative reconstruction technique. CONTRAST:  153mL OMNIPAQUE IOHEXOL 350 MG/ML SOLN COMPARISON:  None Available. FINDINGS: Cardiovascular: Adequate opacification of the pulmonary arterial tree. No intraluminal filling defect identified to suggest acute pulmonary embolism. Moderate multi-vessel coronary artery calcification. Mild global cardiomegaly. Extensive calcification of the mitral valve annulus. Mild calcification of the aortic valve leaflets. No pericardial effusion. Moderate atherosclerotic calcification of the thoracic aorta. No aortic aneurysm. Mediastinum/Nodes: No enlarged mediastinal, hilar, or axillary lymph nodes. Thyroid gland, trachea, and esophagus demonstrate no significant findings. Lungs/Pleura: Spiculated mixed solid and cystic nodule within the anterior left upper lobe measures 11 x 12 x 18 mm on axial image # 72/7  and coronal image # 51/9, indeterminate. This may represent a a subacute focal inflammatory process or focal neoplasm. Associated retraction of the adjacent paramediastinal pleura. Mild emphysema. Small bilateral pleural effusions. Bronchial wall thickening in keeping with airway inflammation. No pneumothorax. No central obstructing lesion. Upper Abdomen: Mild ascites.  No acute abnormality. Musculoskeletal: No acute bone abnormality. No lytic or blastic bone lesion. Review of the MIP images confirms the above findings. IMPRESSION: 1. No pulmonary embolism. 2. Moderate multi-vessel coronary artery calcification. Mild global cardiomegaly. 3. Spiculated mixed solid and cystic nodule within the anterior left upper lobe measuring 18 mm, indeterminate. Consider one of the following in 3 months for both low-risk and high-risk individuals: (a) repeat chest CT, (b) follow-up PET-CT, or (c) tissue sampling. This recommendation follows the consensus statement: Guidelines for Management of Incidental Pulmonary Nodules Detected on CT Images: From the Fleischner Society 2017; Radiology 2017; 284:228-243. 4. Mild emphysema.  Airway inflammation. 5. Small bilateral pleural effusions. 6. Mild ascites. Aortic Atherosclerosis (ICD10-I70.0) and Emphysema (ICD10-J43.9). Electronically Signed   By: Fidela Salisbury M.D.   On: 04/26/2022 02:23   DG Chest Portable 1 View  Result Date: 04/25/2022 CLINICAL DATA:  Shortness of breath. EXAM: PORTABLE CHEST 1 VIEW COMPARISON:  Radiographs dated April 19, 2022. FINDINGS: The heart is mildly enlarged. Atherosclerotic calcification of the aortic arch. Mild pulmonary vascular congestion. No focal consolidation or large pleural effusion. Thoracic spondylosis. IMPRESSION: Mild cardiomegaly and pulmonary vascular congestion. No focal consolidation or large pleural effusion. Electronically Signed   By: Keane Police D.O.   On: 04/25/2022 20:30     ASSESSMENT AND PLAN: Chest pain associated with  mildly elevated tropes troponin about 337 and atrial  fibrillation with rapid ventricular response rate.  He may have coronary artery disease given that this is the second time he had present patient very similar in past few weeks.  Advised left heart catheterization and patient was explained risk benefits and patient agreed to the procedure.  Also placed the patient on IV amiodarone attempt cardioversion.  Patrisia Faeth A

## 2022-04-27 NOTE — Progress Notes (Signed)
Pt. C/o feeling "antsy/anxioius" & a little restless. Asked pt. If he wanted something to help "calm his nerves" & he said yes. Med. Pt. With 1 mg slow IVP Ativan per pt. Request.

## 2022-04-27 NOTE — Progress Notes (Signed)
  Amiodarone Drug - Drug Interaction Consult Note  Recommendations: -Maintain serum potassium > 4 and mag > 2.  Concurrent furosemide use. -Monitor for signs of rhabdo (atorvastatin) -Monitor Qtc (methadone)  Amiodarone is metabolized by the cytochrome P450 system and therefore has the potential to cause many drug interactions. Amiodarone has an average plasma half-life of 50 days (range 20 to 100 days).   There is potential for drug interactions to occur several weeks or months after stopping treatment and the onset of drug interactions may be slow after initiating amiodarone.   [x]  Statins: Increased risk of myopathy. Simvastatin- restrict dose to 20mg  daily. Other statins: counsel patients to report any muscle pain or weakness immediately.  []  Anticoagulants: Amiodarone can increase anticoagulant effect. Consider warfarin dose reduction. Patients should be monitored closely and the dose of anticoagulant altered accordingly, remembering that amiodarone levels take several weeks to stabilize.  []  Antiepileptics: Amiodarone can increase plasma concentration of phenytoin, the dose should be reduced. Note that small changes in phenytoin dose can result in large changes in levels. Monitor patient and counsel on signs of toxicity.  []  Beta blockers: increased risk of bradycardia, AV block and myocardial depression. Sotalol - avoid concomitant use.  []   Calcium channel blockers (diltiazem and verapamil): increased risk of bradycardia, AV block and myocardial depression.  []   Cyclosporine: Amiodarone increases levels of cyclosporine. Reduced dose of cyclosporine is recommended.  []  Digoxin dose should be halved when amiodarone is started.  [x]  Diuretics: increased risk of cardiotoxicity if hypokalemia occurs.  []  Oral hypoglycemic agents (glyburide, glipizide, glimepiride): increased risk of hypoglycemia. Patient's glucose levels should be monitored closely when initiating amiodarone therapy.    [x]  Drugs that prolong the QT interval:  Torsades de pointes risk may be increased with concurrent use - avoid if possible.  Monitor QTc, also keep magnesium/potassium WNL if concurrent therapy can't be avoided.  Antibiotics: e.g. fluoroquinolones, erythromycin.  Antiarrhythmics: e.g. quinidine, procainamide, disopyramide, sotalol.  Antipsychotics: e.g. phenothiazines, haloperidol.   Lithium, tricyclic antidepressants, and methadone. Thank You,  Alison Murray  04/27/2022 8:45 AM

## 2022-04-27 NOTE — Progress Notes (Signed)
Patient had moderate disease in mid LAD and ostial OM1 and distal RCA.  Distal RCA had FFR done which was negative thus no PCI or stenting is necessary.  Advised Plavix and Eliquis and continue amiodarone to see if patient converts to sinus rhythm.  Atrial fibrillation with rapid ventricular response rate may be the cause for intermittent chest pain as troponin was only slightly elevated.  Will continue to follow the patient.

## 2022-04-28 ENCOUNTER — Encounter: Payer: Self-pay | Admitting: Cardiovascular Disease

## 2022-04-28 DIAGNOSIS — J810 Acute pulmonary edema: Secondary | ICD-10-CM | POA: Diagnosis not present

## 2022-04-28 DIAGNOSIS — I350 Nonrheumatic aortic (valve) stenosis: Secondary | ICD-10-CM | POA: Diagnosis not present

## 2022-04-28 DIAGNOSIS — I482 Chronic atrial fibrillation, unspecified: Secondary | ICD-10-CM | POA: Diagnosis not present

## 2022-04-28 DIAGNOSIS — I214 Non-ST elevation (NSTEMI) myocardial infarction: Secondary | ICD-10-CM | POA: Diagnosis not present

## 2022-04-28 LAB — COMPREHENSIVE METABOLIC PANEL
ALT: 20 U/L (ref 0–44)
AST: 22 U/L (ref 15–41)
Albumin: 3.8 g/dL (ref 3.5–5.0)
Alkaline Phosphatase: 57 U/L (ref 38–126)
Anion gap: 9 (ref 5–15)
BUN: 19 mg/dL (ref 8–23)
CO2: 27 mmol/L (ref 22–32)
Calcium: 9.2 mg/dL (ref 8.9–10.3)
Chloride: 104 mmol/L (ref 98–111)
Creatinine, Ser: 0.86 mg/dL (ref 0.61–1.24)
GFR, Estimated: >60 mL/min (ref >60)
Glucose, Bld: 110 mg/dL — ABNORMAL HIGH (ref 70–99)
Potassium: 3.7 mmol/L (ref 3.5–5.1)
Sodium: 140 mmol/L (ref 135–145)
Total Bilirubin: 1.1 mg/dL (ref 0.3–1.2)
Total Protein: 7.1 g/dL (ref 6.5–8.1)

## 2022-04-28 LAB — CBC
HCT: 35.3 % — ABNORMAL LOW (ref 39.0–52.0)
Hemoglobin: 11.3 g/dL — ABNORMAL LOW (ref 13.0–17.0)
MCH: 26.5 pg (ref 26.0–34.0)
MCHC: 32 g/dL (ref 30.0–36.0)
MCV: 82.9 fL (ref 80.0–100.0)
Platelets: 243 10*3/uL (ref 150–400)
RBC: 4.26 MIL/uL (ref 4.22–5.81)
RDW: 16.1 % — ABNORMAL HIGH (ref 11.5–15.5)
WBC: 6.4 10*3/uL (ref 4.0–10.5)
nRBC: 0 % (ref 0.0–0.2)

## 2022-04-28 LAB — APTT: aPTT: 74 s — ABNORMAL HIGH (ref 24–36)

## 2022-04-28 LAB — GLUCOSE, CAPILLARY
Glucose-Capillary: 109 mg/dL — ABNORMAL HIGH (ref 70–99)
Glucose-Capillary: 150 mg/dL — ABNORMAL HIGH (ref 70–99)
Glucose-Capillary: 174 mg/dL — ABNORMAL HIGH (ref 70–99)

## 2022-04-28 LAB — HEPARIN LEVEL (UNFRACTIONATED): Heparin Unfractionated: 0.84 [IU]/mL — ABNORMAL HIGH (ref 0.30–0.70)

## 2022-04-28 MED ORDER — METOPROLOL SUCCINATE ER 100 MG PO TB24
100.0000 mg | ORAL_TABLET | Freq: Every day | ORAL | Status: DC
  Administered 2022-04-29: 100 mg via ORAL
  Filled 2022-04-28 (×2): qty 1

## 2022-04-28 MED ORDER — APIXABAN 5 MG PO TABS
5.0000 mg | ORAL_TABLET | Freq: Two times a day (BID) | ORAL | Status: DC
  Administered 2022-04-28 – 2022-05-05 (×15): 5 mg via ORAL
  Filled 2022-04-28 (×16): qty 1

## 2022-04-28 MED ORDER — VARENICLINE TARTRATE 0.5 MG PO TABS
0.5000 mg | ORAL_TABLET | Freq: Every day | ORAL | Status: DC
  Administered 2022-04-28 – 2022-05-01 (×4): 0.5 mg via ORAL
  Filled 2022-04-28 (×5): qty 1

## 2022-04-28 MED ORDER — METOPROLOL SUCCINATE ER 50 MG PO TB24
50.0000 mg | ORAL_TABLET | Freq: Once | ORAL | Status: AC
  Administered 2022-04-28: 50 mg via ORAL
  Filled 2022-04-28: qty 1

## 2022-04-28 MED ORDER — EMPAGLIFLOZIN 10 MG PO TABS
10.0000 mg | ORAL_TABLET | Freq: Every day | ORAL | Status: DC
  Administered 2022-04-28 – 2022-05-05 (×8): 10 mg via ORAL
  Filled 2022-04-28 (×8): qty 1

## 2022-04-28 MED ORDER — ACETAMINOPHEN 325 MG PO TABS: 650.0000 mg | ORAL_TABLET | ORAL | Status: DC | PRN

## 2022-04-28 MED ORDER — SACUBITRIL-VALSARTAN 24-26 MG PO TABS
1.0000 | ORAL_TABLET | Freq: Two times a day (BID) | ORAL | Status: DC
  Administered 2022-04-29 – 2022-04-30 (×3): 1 via ORAL
  Filled 2022-04-28 (×3): qty 1

## 2022-04-28 MED ORDER — QUETIAPINE FUMARATE 25 MG PO TABS
50.0000 mg | ORAL_TABLET | Freq: Every day | ORAL | Status: DC
  Administered 2022-04-28 – 2022-05-04 (×7): 50 mg via ORAL
  Filled 2022-04-28 (×7): qty 2

## 2022-04-28 MED ORDER — SACUBITRIL-VALSARTAN 24-26 MG PO TABS: 1.0000 | ORAL_TABLET | Freq: Two times a day (BID) | ORAL | Status: DC

## 2022-04-28 MED ORDER — METOPROLOL SUCCINATE ER 100 MG PO TB24: 100.0000 mg | ORAL_TABLET | Freq: Every day | ORAL | Status: DC

## 2022-04-28 NOTE — Consult Note (Signed)
ANTICOAGULATION CONSULT NOTE  Pharmacy Consult for heparin drip Indication: chest pain/ACS  Allergies  Allergen Reactions   Niacin Other (See Comments)   Penicillin G     Patient Measurements:   Heparin Dosing Weight: 74.4 kg  Vital Signs: Temp: 98.8 F (37.1 C) (01/09 0234) BP: 118/77 (01/09 0234) Pulse Rate: 59 (01/09 0234)  Labs: Recent Labs    04/25/22 1642 04/25/22 1642 04/25/22 2124 04/26/22 0146 04/26/22 0550 04/26/22 1506 04/26/22 2216 04/27/22 0529 04/28/22 0248  HGB 11.8*  --   --   --   --   --   --  11.1* 11.3*  HCT 38.1*  --   --   --   --   --   --  35.1* 35.3*  PLT 289  --   --   --   --   --   --  225 243  APTT  --    < > 40*  --  134*   < > 61* 66* 74*  LABPROT  --   --  17.9*  --   --   --   --   --   --   INR  --   --  1.5*  --   --   --   --   --   --   HEPARINUNFRC  --    < > >1.10*  --  >1.10*  --   --  >1.10* 0.84*  CREATININE 1.06  --   --  0.99  --   --   --  0.91 0.86  TROPONINIHS 245*  --  254* 337*  --   --   --   --   --    < > = values in this interval not displayed.     Estimated Creatinine Clearance: 86.1 mL/min (by C-G formula based on SCr of 0.86 mg/dL).   Medical History: Past Medical History:  Diagnosis Date   Asthma    Atrial fibrillation (HCC)    Bicuspid aortic valve    CHF (congestive heart failure) (HCC)    COPD (chronic obstructive pulmonary disease) ([place])    Depression    Diabetes mellitus without complication ([place])    Hypertension     Medications:  Patient takes Eliquis 5 mg po BID, provider reported last dose 1/5 in evening.  Assessment: 67 yo male with history of Afib brought in by EMS with elevated heart rate.  Pharmacy consulted to start heparin drip for possible NSTEMI.  Goal of Therapy:  aPTT 66-102 seconds Heparin level 0.3-0.7 Monitor platelets by anticoagulation protocol: Yes   01/09 0248 aPTT 74 therapeutic x 2, HL 0.84  Plan:  Will continue heparin infusion at 1000 units/hr.  Recheck  aPTT daily while therapeutic on heparin Use aPTT to titrate until correlation with heparin levels Will recheck HL and aPTT on 1/10 with AM labs.  Daily CBC while on heparin drip  Renda Rolls, PharmD, [facility] 04/28/2022 4:38 AM

## 2022-04-28 NOTE — Progress Notes (Signed)
SUBJECTIVE: Patient is a 67 year old Caucasian male with medical history significant for asthma, atrial fibrillation, CHF, COPD, type II diabetes mellitus, hypertension and depression, who presented to the ED on 04/25/22 complaining of worsening shortness of breath, intermittent right-sided chest pain and generalized weakness over the previous 3 days.   Patient found to be in atrial fibrillation with RVR. Admitted to the hospital for NSTEMI and CHF.   Cardiac cath on 18/24 revealed distal RCA lesion 70%, 1st marg lesion 55%, mid LAD 50%. Severe aortic stenosis. Recommend medical therapy for coronary artery disease. No revascularization is indicated.    Vitals:   04/27/22 1957 04/28/22 0234 04/28/22 0534 04/28/22 0823  BP:  118/77 120/78 133/76  Pulse: 99 (!) 59 76 69  Resp:  18 18 18   Temp:  98.8 F (37.1 C) 98.7 F (37.1 C) 98.8 F (37.1 C)  TempSrc:    Axillary  SpO2: 98% 93% 94% 93%    Intake/Output Summary (Last 24 hours) at 04/28/2022 0836 Last data filed at 04/27/2022 2000 Gross per 24 hour  Intake 960 ml  Output 420 ml  Net 540 ml    LABS: Basic Metabolic Panel: Recent Labs    04/27/22 0529 04/28/22 0248  NA 137 140  K 3.2* 3.7  CL 98 104  CO2 28 27  GLUCOSE 95 110*  BUN 18 19  CREATININE 0.91 0.86  CALCIUM 9.1 9.2   Liver Function Tests: Recent Labs    04/28/22 0248  AST 22  ALT 20  ALKPHOS 57  BILITOT 1.1  PROT 7.1  ALBUMIN 3.8   No results for input(s): "LIPASE", "AMYLASE" in the last 72 hours. CBC: Recent Labs    04/27/22 0529 04/28/22 0248  WBC 6.2 6.4  HGB 11.1* 11.3*  HCT 35.1* 35.3*  MCV 83.8 82.9  PLT 225 243   Cardiac Enzymes: No results for input(s): "CKTOTAL", "CKMB", "CKMBINDEX", "TROPONINI" in the last 72 hours. BNP: Invalid input(s): "POCBNP" D-Dimer: No results for input(s): "DDIMER" in the last 72 hours. Hemoglobin A1C: No results for input(s): "HGBA1C" in the last 72 hours. Fasting Lipid Panel: No results for input(s):  "CHOL", "HDL", "LDLCALC", "TRIG", "CHOLHDL", "LDLDIRECT" in the last 72 hours. Thyroid Function Tests: No results for input(s): "TSH", "T4TOTAL", "T3FREE", "THYROIDAB" in the last 72 hours.  Invalid input(s): "FREET3" Anemia Panel: No results for input(s): "VITAMINB12", "FOLATE", "FERRITIN", "TIBC", "IRON", "RETICCTPCT" in the last 72 hours.   PHYSICAL EXAM General: Well developed, well nourished, in no acute distress HEENT:  Normocephalic and atramatic Neck:  No JVD.  Lungs: Clear bilaterally to auscultation and percussion. Heart: HRRR . Normal S1 and S2 without gallops or murmurs.  Abdomen: Bowel sounds are positive, abdomen soft and non-tender  Msk:  Back normal, normal gait. Normal strength and tone for age. Extremities: No clubbing, cyanosis or edema.   Neuro: Alert and oriented X 3. Psych:  Good affect, responds appropriately  TELEMETRY: atrial fibrillation, HR 96 bpm  ASSESSMENT AND PLAN: Patient laying in bed comfortably. Denies chest pain, shortness of breath, dizziness. Groin cath access site bandaged, mild tenderness minimal bruising. Will stop heparin infusion, start Eliquis. Change lisinopril to [facility] for EF of 35-40% on 04/27/22 echo. Change metoprolol tartrate to succinate. Continue IV amiodarone and IV Lasix. Will continue to follow.   Principal Problem:   NSTEMI (non-ST elevated myocardial infarction) (HCC) Active Problems:   Type 2 diabetes mellitus with peripheral neuropathy (HCC)   Chronic obstructive pulmonary disease (COPD) (HCC)   Paroxysmal atrial  fibrillation with RVR (HCC)   Acute on chronic combined systolic and diastolic CHF (congestive heart failure) (HCC)   Essential hypertension   Chronic pain   History of lung cancer   PVD (peripheral vascular disease) (HCC)   Alcohol use disorder, in remission   Tobacco use disorder   Spiculated nodule of upper lobe of left lung CT 04/25/21   Acute pulmonary edema (HCC)   Chronic atrial fibrillation ([place])     Skii Cleland, FNP-C 04/28/2022 8:36 AM

## 2022-04-28 NOTE — TOC Initial Note (Signed)
Transition of Care [facility]) - Initial/Assessment Note    Patient Details  Name: CLIFFORD BENNINGER MRN: [id number] Date of Birth: Feb 22, 1956  Transition [facility]) CM/SW Contact:    Laurena Slimmer, RN Phone Number: 04/28/2022, 11:33 AM  Clinical Narrative:    Spoke with patient regarding discharge plan.                Admitted [id number] Admitted from: Home with wife. Patient stated he was able to care for himself.  PCP: [place]  Current home health/prior home health/DME: Nebulizer, walker, cane Transportation: Patient is able to drive. His wife will transport him home at discharge    Expected Discharge Plan: Home/Self Care Barriers to Discharge: Continued Medical Work up   Patient Goals and CMS Choice Patient states their goals for this hospitalization and ongoing recovery are:: To return home          Expected Discharge Plan and Services       Living arrangements for the past 2 months: Single Family Home                                      Prior Living Arrangements/Services Living arrangements for the past 2 months: Single Family Home Lives with:: Spouse          Need for Family Participation in Patient Care: Yes (Comment) Care giver support system in place?: Yes (comment)   Criminal Activity/Legal Involvement Pertinent to Current Situation/Hospitalization: No - Comment as needed  Activities of Daily Living Home Assistive Devices/Equipment: None ADL Screening (condition at time of admission) Patient's cognitive ability adequate to safely complete daily activities?: Yes Is the patient deaf or have difficulty hearing?: No Does the patient have difficulty seeing, even when wearing glasses/contacts?: No Does the patient have difficulty concentrating, remembering, or making decisions?: No Patient able to express need for assistance with ADLs?: Yes Does the patient have difficulty dressing or bathing?: No Independently  performs ADLs?: Yes (appropriate for developmental age) Does the patient have difficulty walking or climbing stairs?: No Weakness of Legs: None Weakness of Arms/Hands: None  Permission Sought/Granted                  Emotional Assessment   Attitude/Demeanor/Rapport: Gracious, Engaged Affect (typically observed): Accepting Orientation: : Oriented to Self, Oriented to Place, Oriented to  Time, Oriented to Situation Alcohol / Substance Use: Alcohol Use Psych Involvement: No (comment)  Admission diagnosis:  Acute pulmonary edema (HCC) [J81.0] Chronic atrial fibrillation (HCC) [I48.20] NSTEMI (non-ST elevated myocardial infarction) ([place]) [I21.4] Patient Active Problem List   Diagnosis Date Noted   Aortic valve stenosis 04/28/2022   Acute pulmonary edema ([place]) 04/27/2022   Chronic atrial fibrillation ([place]) 04/27/2022   Spiculated nodule of upper lobe of left lung CT 04/25/21 04/26/2022   History of lung cancer 04/25/2022   PVD (peripheral vascular disease) ([place]) 04/25/2022   Alcohol use disorder, in remission 04/25/2022   Tobacco use disorder 04/25/2022   Paroxysmal atrial fibrillation with RVR ([place]) 04/19/2022   Acute on chronic combined systolic and diastolic CHF (congestive heart failure) ([place]) 04/19/2022   Essential hypertension 04/19/2022   Asthma, chronic 04/19/2022   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 ([place]) 04/19/2022   Chronic pain 04/19/2022   NSTEMI (non-ST elevated myocardial infarction) ([place]) 03/21/2022   Type 2 diabetes mellitus with peripheral neuropathy ([place]) 03/21/2022   Dyslipidemia  03/21/2022   Depression 03/21/2022   Chronic obstructive pulmonary disease (COPD) (HCC) 03/21/2022   Acute CHF (congestive heart failure) (HCC) 03/21/2022   New onset atrial fibrillation (HCC) 03/21/2022   PCP:  [facility]   [facility] [address] [place] - [address] [place] [address] Phone:  [phone] Fax: [phone]     Social Determinants of Health (SDOH) Social History: SDOH Screenings   Food Insecurity: No Food Insecurity (04/27/2022)  Housing: Low Risk  (04/27/2022)  Transportation Needs: No Transportation Needs (04/27/2022)  Utilities: Not At Risk (04/27/2022)  Tobacco Use: High Risk (04/28/2022)   SDOH Interventions:     Readmission Risk Interventions    04/28/2022   11:30 AM  Readmission Risk Prevention Plan  Transportation Screening Complete  PCP or Specialist Appt within 3-5 Days Complete  HRI or Home Care Consult Patient refused  Social Work Consult for Recovery Care Planning/Counseling Complete  Palliative Care Screening Not Applicable  Medication Review Oceanographer) Complete

## 2022-04-28 NOTE — Progress Notes (Signed)
Progress Note   Patient: Jason Beck XBD:[id number] DOB: 13-Mar-1956 DOA: 04/25/2022     3 DOS: the patient was seen and examined on 04/28/2022   Brief hospital course: 67 y.o. 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  He complains of intermittent sharp right-sided chest pain.  Denies lower extremity edema or orthopnea.  He is compliant with his Eliquis.  Has had no cough, fever or chills.  States he felt so poorly he did not take his medications today.  He was brought in by EMS who reported a heart rate of 80-1 30 with otherwise normal vitals.    ED course and data review: On arrival, afebrile BP 112/97 with pulse 122 and otherwise normal vitals.  Troponin 245 with BNP 791.9.  WBC and respiratory viral panel negative for COVID flu and RSV.  Hemoglobin at baseline at 11.8.  BMP unremarkable.  EKG, personally viewed and interpreted showing A-fib at 122 with nonspecific ST-T wave changes. Chest x-ray showed mild cardiomegaly and pulmonary vascular congestion.  No focal consolidation or large pleural effusion. Patient was administered his home metoprolol with improvement in heart rate to 99 by admission.  He was also treated with a dose of IV Lasix for possible CHF and started on heparin infusion for possible NSTEMI.    1/7 : Patient had mild uptrend in high-sensitivity troponins however asymptomatic with no chest pain or palpitation/diaphoresis.    1/8- remains hemodynamically stable.  Cardiology performed cath. Identified to have non-obstructive disease with Afib and significant aortic stenosis thought to be contributing factors to his presenting symptoms.  1/9- cardiology following for management of Afib and evaluation for AV replacement  Assessment and Plan: NSTEMI-  Patient presents with fatigue and dyspnea on exertion with troponin 245  Has right sided chest pain and with nonacute EKG Cath performed on 1/8 without intervention. Nonobstructive disease.  - cardiology consulted, appreciate your care  - cath 1/8 - discontinue heparin infusion. Start eliquis - Continue metoprolol and atorvastatin. - started entresto  HFrEF  HTN- echo from 12/2 shows EF 40 to 45% with G1 DD. Echo from 1/8 reveals worsening function. EF now 35-40% with moderate decreased function, severe dilation of LV, LA and RA. Moderate to severe aortic stenosis.  Dyspnea on exertion with BNP 791 and pulmonary vascular congestion on chest x-ray - Continue metoprolol and entresto IV lasix - Daily weights with intake and output monitoring - starting SGLT2i  Paroxysmal atrial fibrillation with RVR - Rate normal  Heart rate 122 on arrival in the setting of missing medication on the day of arrival Recently hospitalized from 12/31 through 04/20/2022 with rapid A-fib attributed to medication noncompliance, Improved with home dose of metoprolol - Continue metoprolol - continue IV amiodarone, per cardiology - Explore barriers to medication compliance. Receives his care at [facility] hospital.   Aortic stenosis- per cardiology, if patient stabilizes from Afib standpoint on amiodarone, will be evaluated for possible aortic valve replacement.   Type 2 diabetes mellitus with peripheral neuropathy (HCC) Controlled.  Last A1c 6.4 on 12/31 Sliding scale insulin coverage while hospitalized - started SGLT2i  Tobacco use disorder - Nicotine patch discontinued - start chantix instead to avoid cardiac implication with  nicotine replacement therapy  Alcohol use disorder, in remission 12 pack beer daily, quit cold [place] a week prior and has no  withdrawals. Became sedated with recent ativan dose.  - hold ativan unless needed for anxiety/withdrawal but appears to be outside withdrawal window.  - monitor CIWA  PVD (peripheral vascular disease) (HCC) Continue aspirin and atorvastatin History of amputation digits right foot  History of lung cancer- cta positive for Spiculated mixed solid and cystic nodule within the anterior left upper lobe measuring 18 mm, indeterminate. Will need follow up imaging in 3 months or sooner. Patient receives his oncology care through [place]. Unsure of the significance of the current findings vs what has been present on monitoring images prior to admission due to not having access to [place] chart. Discussed the findings with patient and his sister at bedside and recommended they discuss with oncology at follow up appointment. They endorsed understanding and agreement.  S/p radiation  at the [facility] 2019 No acute issues suspected - continue oxygen PRN  Chronic pain Continue methadone and oxycodone  COPD- Not acutely exacerbated Continue home inhalers DuoNebs as needed  Subjective: Patient seen and examined this morning.  Vital labs and imaging reviewed.  Patient reports improvement in symptoms with no complaint of chest pain/chest tightness/diaphoresis or palpitations. Denies any complaints.   Physical Exam: Vitals:   04/27/22 1957 04/27/22 1957 04/28/22 0234 04/28/22 0534  BP:   118/77 120/78  Pulse: (!) 118 99 (!) 59 76  Resp:   18 18  Temp:   98.8 F (37.1 C) 98.7 F (37.1 C)  TempSrc:      SpO2: 97% 98% 93% 94%   Physical Exam Constitutional:      Appearance: Normal appearance.     Comments: fatigued  HENT:     Head: Normocephalic.     Nose: Nose normal.  Eyes:     Pupils: Pupils are equal, round, and reactive to light.  Cardiovascular:     Rate and Rhythm: Normal  rate. Rhythm irregular.     Pulses: Normal pulses.  Pulmonary:     Effort: Pulmonary effort is normal.  Abdominal:     Palpations: Abdomen is soft.  Musculoskeletal:        General: Normal range of motion.     Cervical back: Normal range of motion.     Right lower leg: No edema.     Left lower leg: No edema.  Skin:    General: Skin is warm.     Capillary Refill: Capillary refill takes less than 2 seconds.  Neurological:     General: No focal deficit present.     Mental Status: He is alert.  Psychiatric:        Mood and Affect: Mood normal.     Data Reviewed: CBC    Component Value Date/Time   WBC 6.4 04/28/2022 0248   RBC 4.26 04/28/2022 0248   HGB 11.3 (L) 04/28/2022 0248   HCT 35.3 (L) 04/28/2022 0248   PLT 243 04/28/2022 0248   MCV 82.9 04/28/2022 0248   MCH 26.5 04/28/2022 0248   MCHC 32.0 04/28/2022 0248   RDW 16.1 (H) 04/28/2022 0248   LYMPHSABS 2.5 04/19/2022 0105   MONOABS 0.7 04/19/2022 0105   EOSABS 0.2 04/19/2022 0105   BASOSABS 0.0 04/19/2022 0105  Latest Ref Rng & Units 04/28/2022    2:48 AM 04/27/2022    5:29 AM 04/26/2022    1:46 AM  BMP  Glucose 70 - 99 mg/dL 110  95  127   BUN 8 - 23 mg/dL 19  18  19    Creatinine 0.61 - 1.24 mg/dL 0.86  0.91  0.99   Sodium 135 - 145 mmol/L 140  137  137   Potassium 3.5 - 5.1 mmol/L 3.7  3.2  3.8   Chloride 98 - 111 mmol/L 104  98  100   CO2 22 - 32 mmol/L 27  28  26    Calcium 8.9 - 10.3 mg/dL 9.2  9.1  9.4     Family Communication: sister on phone Disposition: Status is: Inpatient Remains inpatient appropriate because: NTEMI, Afib, AV stenosis  Planned Discharge Destination: Home  Time spent: 32 minutes  Author: Richarda Osmond, MD 04/28/2022 7:27 AM  For on call review [email or web address].

## 2022-04-28 NOTE — Plan of Care (Signed)
  Problem: Education: Goal: Ability to describe self-care measures that may prevent or decrease complications (Diabetes Survival Skills Education) will improve Outcome: Progressing Goal: Individualized Educational Video(s) Outcome: Progressing   Problem: Coping: Goal: Ability to adjust to condition or change in health will improve Outcome: Progressing   Problem: Fluid Volume: Goal: Ability to maintain a balanced intake and output will improve Outcome: Progressing   Problem: Health Behavior/Discharge Planning: Goal: Ability to identify and utilize available resources and services will improve Outcome: Progressing Goal: Ability to manage health-related needs will improve Outcome: Progressing   Problem: Metabolic: Goal: Ability to maintain appropriate glucose levels will improve Outcome: Progressing   Problem: Nutritional: Goal: Maintenance of adequate nutrition will improve Outcome: Progressing Goal: Progress toward achieving an optimal weight will improve Outcome: Progressing   Problem: Skin Integrity: Goal: Risk for impaired skin integrity will decrease Outcome: Progressing   Problem: Tissue Perfusion: Goal: Adequacy of tissue perfusion will improve Outcome: Progressing   Problem: Education: Goal: Understanding of cardiac disease, CV risk reduction, and recovery process will improve Outcome: Progressing Goal: Individualized Educational Video(s) Outcome: Progressing   Problem: Activity: Goal: Ability to tolerate increased activity will improve Outcome: Progressing   Problem: Cardiac: Goal: Ability to achieve and maintain adequate cardiovascular perfusion will improve Outcome: Progressing   Problem: Health Behavior/Discharge Planning: Goal: Ability to safely manage health-related needs after discharge will improve Outcome: Progressing   Problem: Education: Goal: Understanding of CV disease, CV risk reduction, and recovery process will improve Outcome:  Progressing Goal: Individualized Educational Video(s) Outcome: Progressing   Problem: Activity: Goal: Ability to return to baseline activity level will improve Outcome: Progressing   Problem: Cardiovascular: Goal: Ability to achieve and maintain adequate cardiovascular perfusion will improve Outcome: Progressing Goal: Vascular access site(s) Level 0-1 will be maintained Outcome: Progressing   Problem: Health Behavior/Discharge Planning: Goal: Ability to safely manage health-related needs after discharge will improve Outcome: Progressing   Problem: Education: Goal: Knowledge of General Education information will improve Description: Including pain rating scale, medication(s)/side effects and non-pharmacologic comfort measures Outcome: Progressing   Problem: Health Behavior/Discharge Planning: Goal: Ability to manage health-related needs will improve Outcome: Progressing   Problem: Clinical Measurements: Goal: Ability to maintain clinical measurements within normal limits will improve Outcome: Progressing Goal: Will remain free from infection Outcome: Progressing Goal: Diagnostic test results will improve Outcome: Progressing Goal: Respiratory complications will improve Outcome: Progressing Goal: Cardiovascular complication will be avoided Outcome: Progressing   Problem: Activity: Goal: Risk for activity intolerance will decrease Outcome: Progressing   Problem: Nutrition: Goal: Adequate nutrition will be maintained Outcome: Progressing   Problem: Coping: Goal: Level of anxiety will decrease Outcome: Progressing   Problem: Elimination: Goal: Will not experience complications related to bowel motility Outcome: Progressing Goal: Will not experience complications related to urinary retention Outcome: Progressing   Problem: Pain Managment: Goal: General experience of comfort will improve Outcome: Progressing   Problem: Safety: Goal: Ability to remain free from  injury will improve Outcome: Progressing   Problem: Skin Integrity: Goal: Risk for impaired skin integrity will decrease Outcome: Progressing   

## 2022-04-28 NOTE — Consult Note (Signed)
   Heart Failure Nurse Navigator Note  HFrEF 35-40%.  Moderate LVH.  Left ventricular internal cavity is severely dilated.  Right ventricular systolic function is severely reduced.  Severe biatrial enlargement.  Mild to moderate mitral regurgitation.  Moderate tricuspid regurgitation.  Moderate to severe aortic stenosis.  He presented to the emergency room from home with complaints of weakness.  BNP 791.  Chest x-ray with cardiomegaly and pulmonary vascular congestion.  Hospitalizations include December 1 to December 4 again December 31 2 April 20, 2022 for noncompliance with his metoprolol the patient was found to be in rapid A-fib.  Comorbidities:  Diabetes Hypertension Peripheral vascular disease History of lung cancer Atrial fibrillation Tobacco abuse Alcohol abuse  Medications:  Amiodarone infusion Apixaban 5 mg 2 times a day Atorvastatin 80 mg daily Plavix 75 mg daily Jardiance 10 mg daily Folic acid 1 mg daily Furosemide 40 mg IV 2 times a day NicoDerm patch 1 daily Thiamine 100 mg daily oral Thiamine 100 mg IV daily  Future medications include: Metoprolol succinate 100 mg daily Entresto 24/26 mg 2 times a day  Labs:  Sodium 137, potassium 3.2, chloride 98, CO2 28, BUN 18, creatinine 0.91 Weights are ordered but not documented. Intake 960 mL Output 720 mL   Initial meeting with patient and his wife [name] who was at the bedside.  Discussed  heart failure and what it means.  He states that he has heard the term before.  He has a scale at home but does not weigh himself on a daily basis.  Talked about  the importance of daily weights, recording and reporting 2 pound weight gain overnight or 5 pounds within the week.  Went over low-sodium diet.  Wife states that the rarely eat at restaurants she states that her husband does like to use salt.  Made aware of low-sodium diet of no more than 2000 mg a day and staying away from processed and convenience foods and trying  to eat fresh foods in their natural state.  Discussed the relationship between of sodium and fluids.  Aware of fluid restriction of no more than 64 ounces 24-hour period and all that that constitutes.  Also discussed follow-up in the outpatient heart failure clinic for which she has an appointment on January 24 at 9 AM.  If is requesting information on heart failure, in the living with heart failure teaching booklet, zone magnet, info on heart failure and low-sodium along with weight chart.  At this time there were no further questions.  Pricilla Riffle RN CHFN

## 2022-04-29 DIAGNOSIS — I482 Chronic atrial fibrillation, unspecified: Secondary | ICD-10-CM | POA: Diagnosis not present

## 2022-04-29 DIAGNOSIS — J810 Acute pulmonary edema: Secondary | ICD-10-CM | POA: Diagnosis not present

## 2022-04-29 DIAGNOSIS — I214 Non-ST elevation (NSTEMI) myocardial infarction: Secondary | ICD-10-CM | POA: Diagnosis not present

## 2022-04-29 LAB — BASIC METABOLIC PANEL
Anion gap: 13 (ref 5–15)
BUN: 18 mg/dL (ref 8–23)
CO2: 25 mmol/L (ref 22–32)
Calcium: 9.6 mg/dL (ref 8.9–10.3)
Chloride: 102 mmol/L (ref 98–111)
Creatinine, Ser: 1.13 mg/dL (ref 0.61–1.24)
GFR, Estimated: >60 mL/min (ref >60)
Glucose, Bld: 108 mg/dL — ABNORMAL HIGH (ref 70–99)
Potassium: 3.4 mmol/L — ABNORMAL LOW (ref 3.5–5.1)
Sodium: 140 mmol/L (ref 135–145)

## 2022-04-29 LAB — CBC
HCT: 39.2 % (ref 39.0–52.0)
Hemoglobin: 12.5 g/dL — ABNORMAL LOW (ref 13.0–17.0)
MCH: 26.2 pg (ref 26.0–34.0)
MCHC: 31.9 g/dL (ref 30.0–36.0)
MCV: 82 fL (ref 80.0–100.0)
Platelets: 274 10*3/uL (ref 150–400)
RBC: 4.78 MIL/uL (ref 4.22–5.81)
RDW: 15.9 % — ABNORMAL HIGH (ref 11.5–15.5)
WBC: 7 10*3/uL (ref 4.0–10.5)
nRBC: 0 % (ref 0.0–0.2)

## 2022-04-29 LAB — GLUCOSE, CAPILLARY
Glucose-Capillary: 152 mg/dL — ABNORMAL HIGH (ref 70–99)
Glucose-Capillary: 105 mg/dL — ABNORMAL HIGH (ref 70–99)
Glucose-Capillary: 87 mg/dL (ref 70–99)
Glucose-Capillary: 106 mg/dL — ABNORMAL HIGH (ref 70–99)

## 2022-04-29 MED ORDER — AMIODARONE HCL 200 MG PO TABS
400.0000 mg | ORAL_TABLET | Freq: Two times a day (BID) | ORAL | Status: DC
  Administered 2022-04-29 – 2022-05-05 (×13): 400 mg via ORAL
  Filled 2022-04-29 (×14): qty 2

## 2022-04-29 MED ORDER — POTASSIUM CHLORIDE CRYS ER 20 MEQ PO TBCR
40.0000 meq | EXTENDED_RELEASE_TABLET | Freq: Two times a day (BID) | ORAL | Status: AC
  Administered 2022-04-29 (×2): 40 meq via ORAL
  Filled 2022-04-29 (×2): qty 2

## 2022-04-29 NOTE — TOC Progression Note (Signed)
Transition of Care [facility]) - Progression Note    Patient Details  Name: NYGEL PROKOP MRN: [id number] Date of Birth: 19-Feb-1956  Transition of Care [facility]) CM/SW [place], LCSW Phone Number: 04/29/2022, 4:09 PM  Clinical Narrative:  [facility] lia[facility] to see if patient is eligible for [facility] placement.   Expected Discharge Plan: Home/Self Care Barriers to Discharge: Continued Medical Work up  Expected Discharge Plan and Services       Living arrangements for the past 2 months: Single Family Home                                       Social Determinants of Health (SDOH) Interventions SDOH Screenings   Food Insecurity: No Food Insecurity (04/27/2022)  Housing: Low Risk  (04/27/2022)  Transportation Needs: No Transportation Needs (04/27/2022)  Utilities: Not At Risk (04/27/2022)  Tobacco Use: High Risk (04/28/2022)    Readmission Risk Interventions    04/28/2022   11:30 AM  Readmission Risk Prevention Plan  Transportation Screening Complete  PCP or Specialist Appt within 3-5 Days Complete  HRI or [place] Patient refused  Social Work Consult for [place] Planning/Counseling Complete  Palliative Care Screening Not Applicable  Medication Review Press photographer) Complete

## 2022-04-29 NOTE — Progress Notes (Addendum)
SUBJECTIVE: Patient is a 67 year old Caucasian male with medical history significant for asthma, atrial fibrillation, CHF, COPD, type II diabetes mellitus, hypertension and depression, who presented to the ED on 04/25/22 complaining of worsening shortness of breath, intermittent right-sided chest pain and generalized weakness over the previous 3 days.    Patient found to be in atrial fibrillation with RVR. Admitted to the hospital for NSTEMI and CHF.    Cardiac cath on 18/24 revealed distal RCA lesion 70%, 1st marg lesion 55%, mid LAD 50%. Severe aortic stenosis. Recommend medical therapy for coronary artery disease. No revascularization is indicated.    Vitals:   04/28/22 1916 04/28/22 2302 04/29/22 0452 04/29/22 0803  BP: 120/71 (!) 119/96 (!) 113/58 (!) 89/68  Pulse: 83 83 96 89  Resp: 16 17 18 18   Temp: 98.1 F (36.7 C) 98.6 F (37 C) 98.7 F (37.1 C) 97.8 F (36.6 C)  TempSrc: Oral Oral    SpO2: 96% 97% 97% 98%    Intake/Output Summary (Last 24 hours) at 04/29/2022 0808 Last data filed at 04/29/2022 0500 Gross per 24 hour  Intake 723 ml  Output 3150 ml  Net -2427 ml    LABS: Basic Metabolic Panel: Recent Labs    04/28/22 0248 04/29/22 0432  NA 140 140  K 3.7 3.4*  CL 104 102  CO2 27 25  GLUCOSE 110* 108*  BUN 19 18  CREATININE 0.86 1.13  CALCIUM 9.2 9.6   Liver Function Tests: Recent Labs    04/28/22 0248  AST 22  ALT 20  ALKPHOS 57  BILITOT 1.1  PROT 7.1  ALBUMIN 3.8   No results for input(s): "LIPASE", "AMYLASE" in the last 72 hours. CBC: Recent Labs    04/28/22 0248 04/29/22 0432  WBC 6.4 7.0  HGB 11.3* 12.5*  HCT 35.3* 39.2  MCV 82.9 82.0  PLT 243 274   Cardiac Enzymes: No results for input(s): "CKTOTAL", "CKMB", "CKMBINDEX", "TROPONINI" in the last 72 hours. BNP: Invalid input(s): "POCBNP" D-Dimer: No results for input(s): "DDIMER" in the last 72 hours. Hemoglobin A1C: No results for input(s): "HGBA1C" in the last 72 hours. Fasting  Lipid Panel: No results for input(s): "CHOL", "HDL", "LDLCALC", "TRIG", "CHOLHDL", "LDLDIRECT" in the last 72 hours. Thyroid Function Tests: No results for input(s): "TSH", "T4TOTAL", "T3FREE", "THYROIDAB" in the last 72 hours.  Invalid input(s): "FREET3" Anemia Panel: No results for input(s): "VITAMINB12", "FOLATE", "FERRITIN", "TIBC", "IRON", "RETICCTPCT" in the last 72 hours.   PHYSICAL EXAM General: Well developed, well nourished, in no acute distress HEENT:  Normocephalic and atramatic Neck:  No JVD.  Lungs: Clear bilaterally to auscultation and percussion. Heart: HRRR . Normal S1 and S2 without gallops or murmurs.  Abdomen: Bowel sounds are positive, abdomen soft and non-tender  Msk:  Back normal, normal gait. Normal strength and tone for age. Extremities: No clubbing, cyanosis or edema.   Neuro: Alert and oriented X 3. Psych:  Good affect, responds appropriately  TELEMETRY: atrial fibrillation, HR 91-101 bpm  ASSESSMENT AND PLAN: Patient resting comfortably in bed. Denies chest pain, shortness of breath, dizziness. Remains in atrial fibrillation, HR improved. Transition IV amiodarone to oral amiodarone. Patient can be discharged home today with follow up in office on Monday, 05/04/21 at 10:00 am.   Principal Problem:   NSTEMI (non-ST elevated myocardial infarction) [facility]) Active Problems:   Type 2 diabetes mellitus with peripheral neuropathy (HCC)   Chronic obstructive pulmonary disease (COPD) (HCC)   Paroxysmal atrial fibrillation with RVR ([place])  Acute on chronic combined systolic and diastolic CHF (congestive heart failure) (HCC)   Essential hypertension   Chronic pain   History of lung cancer   PVD (peripheral vascular disease) (HCC)   Alcohol use disorder, in remission   Tobacco use disorder   Spiculated nodule of upper lobe of left lung CT 04/25/21   Acute pulmonary edema (HCC)   Chronic atrial fibrillation (HCC)   Aortic valve stenosis    Severn Goddard,  FNP-C 04/29/2022 8:08 AM

## 2022-04-29 NOTE — Progress Notes (Signed)
Progress Note   Patient: Jason Beck TDV:[id number] DOB: 02/23/1956 DOA: 04/25/2022     4 DOS: the patient was seen and examined on 04/29/2022   Brief hospital course: 67 y.o. 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  He complains of intermittent sharp right-sided chest pain.  Denies lower extremity edema or orthopnea.  He is compliant with his Eliquis.  Has had no cough, fever or chills.  States he felt so poorly he did not take his medications today.  He was brought in by EMS who reported a heart rate of 80-1 30 with otherwise normal vitals.    ED course and data review: On arrival, afebrile BP 112/97 with pulse 122 and otherwise normal vitals.  Troponin 245 with BNP 791.9.  WBC and respiratory viral panel negative for COVID flu and RSV.  Hemoglobin at baseline at 11.8.  BMP unremarkable.  EKG, personally viewed and interpreted showing A-fib at 122 with nonspecific ST-T wave changes. Chest x-ray showed mild cardiomegaly and pulmonary vascular congestion.  No focal consolidation or large pleural effusion. Patient was administered his home metoprolol with improvement in heart rate to 99 by admission.  He was also treated with a dose of IV Lasix for possible CHF and started on heparin infusion for possible NSTEMI.    1/7 : Patient had mild uptrend in high-sensitivity troponins however asymptomatic with no chest pain or palpitation/diaphoresis.    1/8- remains hemodynamically stable.  Cardiology performed cath. Identified to have non-obstructive disease with Afib and significant aortic stenosis thought to be contributing factors to his presenting symptoms.  1/9- cardiology following for management of Afib and evaluation for AV replacement  1/10- stable from cardiac standpoint. PT evaluated and recommended SNF at dc.  Assessment and Plan: NSTEMI-  Patient presents with fatigue and dyspnea on exertion with troponin 245  Has right sided chest pain and with nonacute EKG Cath performed on 1/8 without intervention. Nonobstructive disease.  - cardiology consulted, appreciate your care  - cath 1/8 - discontinue heparin infusion. Start eliquis - Continue metoprolol and atorvastatin. - started entresto  HFrEF  HTN- echo from 12/2 shows EF 40 to 45% with G1 DD. Echo from 1/8 reveals worsening function. EF now 35-40% with moderate decreased function, severe dilation of LV, LA and RA. Moderate to severe aortic stenosis.  Dyspnea on exertion with BNP 791 and pulmonary vascular congestion on chest x-ray - Continue metoprolol and entresto IV lasix - Daily weights with intake and output monitoring - starting SGLT2i  Paroxysmal atrial fibrillation with RVR - Rate normal  Heart rate 122 on arrival in the setting of missing medication on the day of arrival Recently hospitalized from 12/31 through 04/20/2022 with rapid A-fib attributed to medication noncompliance, Improved with home dose of metoprolol - Continue metoprolol - continue amiodarone from IV to PO, per cardiology - Explore barriers to medication compliance. Receives his care at [facility] hospital.   Aortic stenosis- per cardiology, if patient stabilizes from Afib standpoint on amiodarone, will be evaluated for  possible aortic valve replacement.   Type 2 diabetes mellitus with peripheral neuropathy (HCC) Controlled.  Last A1c 6.4 on 12/31 Sliding scale insulin coverage while hospitalized - started SGLT2i  Tobacco use  disorder - Nicotine patch discontinued - start chantix instead to avoid cardiac implication with nicotine replacement therapy  Alcohol use disorder, in remission 12 pack beer daily, quit cold [place] a week prior and has no  withdrawals. Became sedated with recent ativan dose.  - hold ativan unless needed for anxiety/withdrawal but appears to be outside withdrawal window.  - monitor CIWA  PVD (peripheral vascular disease) (HCC) Continue aspirin and atorvastatin History of amputation digits right foot  History of lung cancer- cta positive for Spiculated mixed solid and cystic nodule within the anterior left upper lobe measuring 18 mm, indeterminate. Will need follow up imaging in 3 months or sooner. Patient receives his oncology care through [place]. Unsure of the significance of the current findings vs what has been present on monitoring images prior to admission due to not having access to [place] chart. Discussed the findings with patient and his sister at bedside and recommended they discuss with oncology at follow up appointment. They endorsed understanding and agreement.  S/p radiation  at the [facility] 2019 No acute issues suspected - continue oxygen PRN  Chronic pain Continue methadone and oxycodone  COPD- Not acutely exacerbated Continue home inhalers DuoNebs as needed  Subjective: Patient seen and examined this morning.  Vital labs and imaging reviewed.  Patient reports complaints of being in the hospital. He wants to go home. Denies CP, SOB.   Physical Exam: Vitals:   04/28/22 1620 04/28/22 1916 04/28/22 2302 04/29/22 0452  BP: 139/80 120/71 (!) 119/96 (!) 113/58  Pulse: 69 83 83 96  Resp: 18 16 17 18   Temp: 98.3 F (36.8 C) 98.1 F (36.7 C) 98.6 F (37 C) 98.7 F (37.1 C)  TempSrc: Oral Oral Oral   SpO2: 97% 96% 97% 97%   Physical Exam Constitutional:      Appearance: Normal appearance.     Comments: fatigued  HENT:     Head: Normocephalic.     Nose: Nose normal.  Eyes:      Pupils: Pupils are equal, round, and reactive to light.  Cardiovascular:     Rate and Rhythm: Normal rate and regular rhythm.     Pulses: Normal pulses.  Pulmonary:     Effort: Pulmonary effort is normal.  Abdominal:     Palpations: Abdomen is soft.  Musculoskeletal:        General: Normal range of motion.     Cervical back: Normal range of motion.     Right lower leg: No edema.     Left lower leg: No edema.  Skin:    General: Skin is warm.     Capillary Refill: Capillary refill takes less than 2 seconds.  Neurological:     General: No focal deficit present.     Mental Status: He is alert.  Psychiatric:        Mood and Affect: Mood normal.     Data Reviewed: CBC    Component Value Date/Time   WBC 7.0 04/29/2022 0432   RBC 4.78 04/29/2022 0432   HGB 12.5 (L) 04/29/2022 0432   HCT 39.2 04/29/2022 0432   PLT 274 04/29/2022 0432   MCV 82.0 04/29/2022 0432   MCH 26.2 04/29/2022 0432   MCHC 31.9 04/29/2022 0432   RDW 15.9 (H) 04/29/2022 0432   LYMPHSABS 2.5 04/19/2022  0105   MONOABS 0.7 04/19/2022 0105   EOSABS 0.2 04/19/2022 0105   BASOSABS 0.0 04/19/2022 0105      Latest Ref Rng & Units 04/29/2022    4:32 AM 04/28/2022    2:48 AM 04/27/2022    5:29 AM  BMP  Glucose 70 - 99 mg/dL 329  518  95   BUN 8 - 23 mg/dL 18  19  18    Creatinine 0.61 - 1.24 mg/dL  8.41  6.60   Sodium 135 - 145 mmol/L 140  140  137   Potassium 3.5 - 5.1 mmol/L 3.4  3.7  3.2   Chloride 98 - 111 mmol/L 102  104  98   CO2 22 - 32 mmol/L 25  27  28    Calcium 8.9 - 10.3 mg/dL 9.6  9.2  9.1     Family Communication: wife and sister on phone Disposition: Status is: Inpatient Remains inpatient appropriate because: NTEMI, Afib, AV stenosis  Planned Discharge Destination: Home  Time spent: 35 minutes  Author: 6.30, MD 04/29/2022 7:32 AM  For on call review [email or web address].

## 2022-04-29 NOTE — Progress Notes (Signed)
   Heart Failure Nurse Navigator Note  Met with patient wife, [name] today, patient was in the room and asleep.  Allowed wife  time to verbalize, she states that she had researched heart failure and prognosis on the Internet and felt with his comorbidities he did not have much of a life expectancy.  She also feels when he goes home he will not do the things he needs to like following low sodium diet, etc.  She goes on to state that she has a daughter that lives in [place] with her family, and older sister that lives in [place]. Does not feel she has support of close family.  Becomes tearful as she states" her husband is her rock."  She has the folder on heart failure and knows he has follow up in the HF clinic at 60 Am on January 24.  She had no further questions.  Will continue to follow.  Pricilla Riffle RN [facility]

## 2022-04-29 NOTE — Evaluation (Signed)
Physical Therapy Evaluation Patient Details Name: Jason Beck MRN: [id number] DOB: 1955/11/07 Today's Date: 04/29/2022  History of Present Illness  67 y.o. 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  He complains of intermittent sharp right-sided chest pain.  Denies lower extremity edema or orthopnea.  He is compliant with his Eliquis.  Has had no cough, fever or chills.  States he felt so poorly he did not take his medications today.  He was brought in by EMS who reported a heart rate of 80-1 30 with otherwise normal vitals.       ED course and data review: On arrival, afebrile BP 112/97 with pulse 122 and otherwise normal vitals.  Troponin 245 with BNP 791.9.  WBC and respiratory viral panel negative for COVID flu and RSV.  Hemoglobin at baseline at 11.8.  BMP unremarkable.  EKG, personally viewed and interpreted showing A-fib at 122 with nonspecific ST-T wave changes.  Chest x-ray showed mild cardiomegaly and pulmonary vascular congestion.  No focal consolidation or large pleural effusion.  Patient was administered his home metoprolol with improvement in heart rate to 99 by admission.  He was also treated with a dose of IV Lasix for possible CHF and started on heparin infusion for possible NSTEMI.  Clinical Impression  Patient received in bed, wife at bedside, but she left at beginning of session. Patient is agreeable to PT assessment. He is not oriented to place, time or situation. He  required multimodal cues for bed mobility/safety/balance. Patient kept attempting to stand despite no walker and PT not ready to assist. He resists assistance as well. Patient required several attempts to get standing at edge of bed. Once standing he is unsteady on feet and only able to take a few steps at edge of bed. Unsafe to attempt ambulating further at this time. Patient not following instruction well during session. He will continue to benefit from skilled PT to improve safety and functional independence.         Recommendations for follow up therapy are one component of a multi-disciplinary discharge planning process, led by the attending physician.  Recommendations may be updated based on patient status, additional functional criteria and insurance authorization.  Follow Up Recommendations Skilled nursing-short term rehab (<3 hours/day) Can patient physically be transported by private vehicle: No    Assistance Recommended at Discharge Frequent or constant Supervision/Assistance  Patient can return home with the following  A lot of help with walking and/or transfers;A lot of help with bathing/dressing/bathroom;Direct supervision/assist for medications management;Assistance with feeding;Assist for transportation;Help with stairs or ramp for entrance    Equipment Recommendations Other (comment) (TBD)  Recommendations for Other Services       Functional Status Assessment Patient has had a recent decline in their functional status and demonstrates the ability to make significant improvements in function in a reasonable and predictable amount of time.     Precautions / Restrictions Precautions Precautions: Fall Restrictions Weight Bearing Restrictions: No      Mobility  Bed Mobility  Overal bed mobility: Needs Assistance Bed Mobility: Supine to Sit, Sit to Supine     Supine to sit: Min assist, HOB elevated Sit to supine: Min guard   General bed mobility comments: poor insight.  Difficulty following direction    Transfers Overall transfer level: Needs assistance Equipment used: 1 person hand held assist Transfers: Sit to/from Stand Sit to Stand: Min assist           General transfer comment: required several attempts to stand, increased time needed. Unsteady    Ambulation/Gait Ambulation/Gait assistance: Min Web designer (Feet): 2 Feet Assistive device: 1 person hand held assist Gait Pattern/deviations: Decreased step length - right, Decreased step length - left, Decreased stride length Gait velocity: decr     General Gait Details: patient able to take a couple of steps along edge of bed. Unsteady, unsafe, poor awareness. Not safe to attempt mobility away from bed at this time.  Stairs            Wheelchair Mobility    Modified Rankin (Stroke Patients Only)       Balance Overall balance assessment: Needs assistance Sitting-balance support: Feet supported Sitting balance-Leahy Scale: Poor     Standing balance support: Single extremity supported, During functional activity Standing balance-Leahy Scale: Poor Standing balance comment: high risk of falls                             Pertinent Vitals/Pain Pain Assessment Pain Assessment: No/denies pain    Home Living Family/patient expects to be discharged to:: Private residence Living Arrangements: Spouse/significant other Available Help at Discharge: Family;Available 24 hours/day Type of Home: House Home Access: Stairs to enter Entrance Stairs-Rails: Right Entrance Stairs-Number of Steps: 3   Home Layout: One level Home Equipment: Conservation officer, nature (2 wheels)      Prior Function Prior Level of Function : Independent/Modified Independent;Driving             Mobility Comments: patient states he uses walker and drives, but unsure of accuracy of this information. Wife left as session began.       Hand Dominance        Extremity/Trunk Assessment    Upper Extremity Assessment Upper Extremity Assessment: Generalized weakness    Lower Extremity Assessment Lower Extremity Assessment: Generalized weakness       Communication   Communication: Expressive difficulties;Other (comment) (garbled speech, difficult to understand at times)  Cognition Arousal/Alertness: Lethargic Behavior During Therapy: Flat affect Overall Cognitive Status: No family/caregiver present to determine baseline cognitive functioning                                 General Comments: patient is not oriented to place, situation. Poor safety awareness. Poor awareness of deficits. Difficulty with following direction during session. Initially told me he didn't kow who the woman in the room was, then said it was his wife. He did not know he was in the hospital.        General Comments      Exercises     Assessment/Plan    PT Assessment Patient needs continued PT services  PT Problem List Decreased strength;Decreased coordination;Decreased activity tolerance;Decreased balance;Decreased mobility;Decreased knowledge of precautions;Decreased safety awareness;Decreased knowledge of use of DME;Decreased cognition       PT Treatment Interventions DME instruction;Therapeutic exercise;Gait training;Balance training;Stair training;Functional mobility training;Therapeutic activities;Patient/family education;Cognitive remediation    PT  Goals (Current goals can be found in the Care Plan section)  Acute Rehab PT Goals PT Goal Formulation: Patient unable to participate in goal setting Time For Goal Achievement: 05/13/22    Frequency Min 2X/week     Co-evaluation               AM-PAC PT "6 Clicks" Mobility  Outcome Measure Help needed turning from your back to your side while in a flat bed without using bedrails?: A Little Help needed moving from lying on your back to sitting on the side of a flat bed without using bedrails?: A Little Help needed  moving to and from a bed to a chair (including a wheelchair)?: A Lot Help needed standing up from a chair using your arms (e.g., wheelchair or bedside chair)?: A Little Help needed to walk in hospital room?: A Lot Help needed climbing 3-5 steps with a railing? : A Lot 6 Click Score: 15    End of Session Equipment Utilized During Treatment: Gait belt Activity Tolerance: Patient limited by fatigue;Other (comment) (cognition, poor safety awareness) Patient left: in bed;with bed alarm set;with call bell/phone within reach Nurse Communication: Mobility status PT Visit Diagnosis: Unsteadiness on feet (R26.81);Other abnormalities of gait and mobility (R26.89);Muscle weakness (generalized) (M62.81);Difficulty in walking, not elsewhere classified (R26.2)    Time: 1050-1104 PT Time Calculation (min) (ACUTE ONLY): 14 min   Charges:   PT Evaluation $PT Eval Moderate Complexity: 1 Mod          Azoria Abbett, PT, GCS 04/29/22,11:21 AM

## 2022-04-30 ENCOUNTER — Encounter: Payer: No Typology Code available for payment source | Admitting: Family

## 2022-04-30 DIAGNOSIS — J810 Acute pulmonary edema: Secondary | ICD-10-CM | POA: Diagnosis not present

## 2022-04-30 DIAGNOSIS — I482 Chronic atrial fibrillation, unspecified: Secondary | ICD-10-CM | POA: Diagnosis not present

## 2022-04-30 DIAGNOSIS — I214 Non-ST elevation (NSTEMI) myocardial infarction: Secondary | ICD-10-CM | POA: Diagnosis not present

## 2022-04-30 LAB — CBC
HCT: 41.7 % (ref 39.0–52.0)
Hemoglobin: 13.3 g/dL (ref 13.0–17.0)
MCH: 26.3 pg (ref 26.0–34.0)
MCHC: 31.9 g/dL (ref 30.0–36.0)
MCV: 82.4 fL (ref 80.0–100.0)
Platelets: 310 10*3/uL (ref 150–400)
RBC: 5.06 MIL/uL (ref 4.22–5.81)
RDW: 16.3 % — ABNORMAL HIGH (ref 11.5–15.5)
WBC: 7.9 10*3/uL (ref 4.0–10.5)
nRBC: 0 % (ref 0.0–0.2)

## 2022-04-30 LAB — BASIC METABOLIC PANEL
Anion gap: 15 (ref 5–15)
BUN: 25 mg/dL — ABNORMAL HIGH (ref 8–23)
CO2: 22 mmol/L (ref 22–32)
Calcium: 9.7 mg/dL (ref 8.9–10.3)
Chloride: 103 mmol/L (ref 98–111)
Creatinine, Ser: 1.42 mg/dL — ABNORMAL HIGH (ref 0.61–1.24)
GFR, Estimated: 54 mL/min — ABNORMAL LOW (ref >60)
Glucose, Bld: 125 mg/dL — ABNORMAL HIGH (ref 70–99)
Potassium: 3.9 mmol/L (ref 3.5–5.1)
Sodium: 140 mmol/L (ref 135–145)

## 2022-04-30 LAB — GLUCOSE, CAPILLARY
Glucose-Capillary: 134 mg/dL — ABNORMAL HIGH (ref 70–99)
Glucose-Capillary: 169 mg/dL — ABNORMAL HIGH (ref 70–99)
Glucose-Capillary: 114 mg/dL — ABNORMAL HIGH (ref 70–99)
Glucose-Capillary: 226 mg/dL — ABNORMAL HIGH (ref 70–99)

## 2022-04-30 MED ORDER — METOPROLOL SUCCINATE ER 50 MG PO TB24
50.0000 mg | ORAL_TABLET | Freq: Every day | ORAL | Status: DC
  Administered 2022-05-04 – 2022-05-05 (×2): 50 mg via ORAL
  Filled 2022-04-30 (×5): qty 1

## 2022-04-30 MED ORDER — FUROSEMIDE 10 MG/ML IJ SOLN
40.0000 mg | Freq: Every day | INTRAMUSCULAR | Status: DC
  Administered 2022-04-30: 40 mg via INTRAVENOUS
  Filled 2022-04-30: qty 4

## 2022-04-30 NOTE — Progress Notes (Signed)
Progress Note   Patient: Jason Beck AYT:[id number] DOB: 03/27/1956 DOA: 04/25/2022     5 DOS: the patient was seen and examined on 04/30/2022   Brief hospital course: 67 y.o. 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  He complains of intermittent sharp right-sided chest pain.  Denies lower extremity edema or orthopnea.  He is compliant with his Eliquis.  Has had no cough, fever or chills.  States he felt so poorly he did not take his medications today.  He was brought in by EMS who reported a heart rate of 80-1 30 with otherwise normal vitals.    ED course and data review: On arrival, afebrile BP 112/97 with pulse 122 and otherwise normal vitals.  Troponin 245 with BNP 791.9.  WBC and respiratory viral panel negative for COVID flu and RSV.  Hemoglobin at baseline at 11.8.  BMP unremarkable.  EKG, personally viewed and interpreted showing A-fib at 122 with nonspecific ST-T wave changes. Chest x-ray showed mild cardiomegaly and pulmonary vascular congestion.  No focal consolidation or large pleural effusion. Patient was administered his home metoprolol with improvement in heart rate to 99 by admission.  He was also treated with a dose of IV Lasix for possible CHF and started on heparin infusion for possible NSTEMI.    1/7 : Patient had mild uptrend in high-sensitivity troponins however asymptomatic with no chest pain or palpitation/diaphoresis.    1/8- remains hemodynamically stable.  Cardiology performed cath. Identified to have non-obstructive disease with Afib and significant aortic stenosis thought to be contributing factors to his presenting symptoms.  1/9- cardiology following for management of Afib and evaluation for AV replacement  1/10-1/11- stable from cardiac standpoint. PT evaluated and recommended SNF at dc. Awaiting SNF placement. Appears to be suffering from acute grief vs delirium.   Assessment and Plan: NSTEMI-  Patient presents with fatigue and dyspnea on exertion with troponin 245  Has right sided chest pain and with nonacute EKG Cath performed on 1/8 without intervention. Nonobstructive disease.  - cardiology consulted, appreciate your care  - cath 1/8 - discontinue heparin infusion. Start eliquis - Continue metoprolol and atorvastatin. - started entresto  HFrEF  HTN- echo from 12/2 shows EF 40 to 45% with G1 DD. Echo from 1/8 reveals worsening function. EF now 35-40% with moderate decreased function, severe dilation of LV, LA and RA. Moderate to severe aortic stenosis.  Dyspnea on exertion with BNP 791 and pulmonary vascular congestion on chest x-ray at admission. Now appears euvolemic on exam and stable ORA.  - Continue metoprolol and entresto IV lasix - Daily weights with intake and output monitoring - starting SGLT2i - PT/OT evaluation- recommending SNF placement. Patient's wife is in agreement, patient is not happy about it. TOC is consulted for management  AKI- Cr baseline around 0.9. today is 1.42. likely due to lasix and reaching dry point. - decrease lasix to daily or PRN - BMP am  Paroxysmal atrial fibrillation with RVR - Rate normal  Heart rate 122 on  arrival in the setting of missing medication on the day of arrival Recently hospitalized from 12/31 through 04/20/2022 with rapid A-fib attributed to medication noncompliance, Improved with home dose of metoprolol - Continue metoprolol - continue amiodarone from IV to PO, per  cardiology - Explore barriers to medication compliance. Receives [facility] care at [facility] hospital.   Aortic stenosis- per early cardiology notes, if patient stabilizes from Afib standpoint on amiodarone, will be evaluated for possible aortic valve replacement.  - continue to follow up with cardiology attending.   Type 2 diabetes mellitus with peripheral neuropathy (HCC) Controlled.  Last A1c 6.4 on 12/31 Sliding scale insulin coverage while hospitalized - started SGLT2i  Tobacco use disorder - Nicotine patch discontinued - start chantix instead to avoid cardiac implication with nicotine replacement therapy  Alcohol use disorder, in remission 12 pack beer daily, quit cold [place] weeks prior to admission and has no withdrawals. Became sedated with recent ativan dose.  - hold ativan unless needed for anxiety/withdrawal but appears to be outside withdrawal window.  - monitor CIWA  Dementia with acute intermittent delirium  acute grief vs depression - chaplain consult - consider psych consult - continue seroquel QHS - palliative consult  PVD (peripheral vascular disease) (HCC) Continue aspirin and atorvastatin History of amputation digits right foot  History of lung cancer- cta positive for Spiculated mixed solid and cystic nodule within the anterior left upper lobe measuring 18 mm, indeterminate. Will need follow up imaging in 3 months or sooner. Patient receives his oncology care through [place]. Unsure of the significance of the current findings vs what has been present on monitoring images prior to admission due to not having access to [place] chart. Discussed the findings with patient and his sister at bedside and recommended they discuss with oncology at follow up appointment. They endorsed understanding and agreement.  S/p radiation at the [facility] 2019 No acute issues suspected - continue oxygen PRN  Chronic pain Continue methadone and oxycodone  COPD- Not acutely exacerbated Continue home  inhalers DuoNebs as needed  Subjective: Patient seen and examined this morning.  Vital labs and imaging reviewed.  Patient reports complaints of being in the hospital. He wants to go home. Expresses disappointment and depressed mood at news that he will have to go to SNF. Denies CP, SOB.   Physical Exam: Vitals:   04/29/22 0803 04/29/22 0820 04/29/22 1200 04/29/22 1559  BP: (!) 89/68 100/65 (!) 108/53 (!) 143/87  Pulse: 89   78  Resp: 18  19 18   Temp: 97.8 F (36.6 C)  97.7 F (36.5 C) 97.7 F (36.5 C)  TempSrc:   Axillary Axillary  SpO2: 98%  98% 100%  Weight:  61.9 kg     Physical Exam Constitutional:      Appearance: Normal appearance.     Comments: fatigued  HENT:     Head: Normocephalic.     Nose: Nose normal.  Eyes:     Pupils: Pupils are equal, round, and reactive to light.  Cardiovascular:     Rate and Rhythm: Normal rate and regular rhythm.     Pulses: Normal pulses.  Pulmonary:     Effort: Pulmonary effort is normal.  Abdominal:     Palpations: Abdomen is soft.  Musculoskeletal:        General: Tenderness present. Normal range of motion.     Cervical back: Normal range of motion.     Right lower leg: No edema.     Left lower leg: No edema.  Comments: Expresses tenderness with palpation of lower legs but see no redness, swelling, bruising  Skin:    General: Skin is warm.     Capillary Refill: Capillary refill takes less than 2 seconds.  Neurological:     General: No focal deficit present.     Mental Status: He is alert.  Psychiatric:        Mood and Affect: Mood normal.     Data Reviewed: CBC    Component Value Date/Time   WBC 7.9 04/30/2022 0240   RBC 5.06 04/30/2022 0240   HGB 13.3 04/30/2022 0240   HCT 41.7 04/30/2022 0240   PLT 310 04/30/2022 0240   MCV 82.4 04/30/2022 0240   MCH 26.3 04/30/2022 0240   MCHC 31.9 04/30/2022 0240   RDW 16.3 (H) 04/30/2022 0240   LYMPHSABS 2.5 04/19/2022 0105   MONOABS 0.7 04/19/2022 0105   EOSABS 0.2  04/19/2022 0105   BASOSABS 0.0 04/19/2022 0105      Latest Ref Rng & Units 04/30/2022    2:40 AM 04/29/2022    4:32 AM 04/28/2022    2:48 AM  BMP  Glucose 70 - 99 mg/dL 401  027  253   BUN 8 - 23 mg/dL 25  18  19    Creatinine 0.61 - 1.24 mg/dL  6.64  4.03   Sodium 135 - 145 mmol/L 140  140  140   Potassium 3.5 - 5.1 mmol/L 3.9  3.4  3.7   Chloride 98 - 111 mmol/L 103  102  104   CO2 22 - 32 mmol/L 22  25  27    Calcium 8.9 - 10.3 mg/dL 9.7  9.6  9.2     Family Communication: wife at bedside Disposition: Status is: Inpatient Remains inpatient appropriate because: NTEMI, Afib, AV stenosis awaiting dc to SNF  Planned Discharge Destination: Home  Time spent: 35 minutes  Author: 4.74, MD 04/30/2022 7:31 AM  For on call review [email or web address].

## 2022-04-30 NOTE — Progress Notes (Signed)
Mobility Specialist - Progress Note   04/30/22 1000  Mobility  Activity Ambulated with assistance in hallway;Transferred to/from [facility]  Level of Assistance Standby assist, set-up cues, supervision of patient - no hands on  Assistive Device Front wheel walker  Distance Ambulated (ft) 160 ft  Activity Response Tolerated well  $Mobility charge 1 Mobility     Pt lying in bed upon arrival, utilizing RA. Alert and motivated for activity. Pt able to complete bed mobility modI. STS and transfer to [place]. VC for hand placement and sequencing steps to turn. Pt follows commands fairly well today. A little impulsive standing from [facility] but continued ambulation into hallway without LOB. Difficulty navigating obstacles, veers L/R. No complaints. Pt returned to EOB with needs in reach, sitter and spouse at bedside.    Kathee Delton Mobility Specialist 04/30/22, 10:45 AM

## 2022-04-30 NOTE — TOC Progression Note (Signed)
Transition of Care [facility]) - Progression Note    Patient Details  Name: Jason Beck MRN: [id number] Date of Birth: 06/21/1955  Transition of Care [facility]) CM/SW Contact  Laurena Slimmer, RN Phone Number: 04/30/2022, 3:48 PM  Clinical Narrative:    Attempt to reach patient's sister Lysbeth Penner. No answer. Left a message.   Retrieved call from patient's sister. She stated she is listed for emergencies but has been in contact with patient's wife. She provided patient's wife contact information. Cell phone [phone], Home phone [phone].   Patient's wife reached at [phone]. She was concerned  about how much it would cost for patient to stay at the facility. Patient's wife advised she would need to contact the insurance company directly for specific billing questions and payments. She would like to talk to the insurance company before she makes a decision about which facility.    Expected Discharge Plan: Home/Self Care Barriers to Discharge: Continued Medical Work up  Expected Discharge Plan and Services       Living arrangements for the past 2 months: Single Family Home                                       Social Determinants of Health (SDOH) Interventions SDOH Screenings   Food Insecurity: No Food Insecurity (04/27/2022)  Housing: Low Risk  (04/27/2022)  Transportation Needs: No Transportation Needs (04/27/2022)  Utilities: Not At Risk (04/27/2022)  Tobacco Use: High Risk (04/28/2022)    Readmission Risk Interventions    04/28/2022   11:30 AM  Readmission Risk Prevention Plan  Transportation Screening Complete  PCP or Specialist Appt within 3-5 Days Complete  HRI or [place] Patient refused  Social Work Consult for [place] Planning/Counseling Complete  Palliative Care Screening Not Applicable  Medication Review Press photographer) Complete

## 2022-04-30 NOTE — Progress Notes (Signed)
Physical Therapy Treatment Patient Details Name: Jason Beck MRN: [id number] DOB: 1955/08/18 Today's Date: 04/30/2022   History of Present Illness 67 y.o. 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  He complains of intermittent sharp right-sided chest pain.  Denies lower extremity edema or orthopnea.  He is compliant with his Eliquis.  Has had no cough, fever or chills.  States he felt so poorly he did not take his medications today.  He was brought in by EMS who reported a heart rate of 80-1 30 with otherwise normal vitals.       ED course and data review: On arrival, afebrile BP 112/97 with pulse 122 and otherwise normal vitals.  Troponin 245 with BNP 791.9.  WBC and respiratory viral panel negative for COVID flu and RSV.  Hemoglobin at baseline at 11.8.  BMP unremarkable.  EKG, personally viewed and interpreted showing A-fib at 122 with nonspecific ST-T wave changes.  Chest x-ray showed mild cardiomegaly and pulmonary vascular congestion.  No focal consolidation or large pleural effusion.  Patient was administered his home metoprolol with improvement in heart rate to 99 by admission.  He was also treated with a dose of IV Lasix for possible CHF and started on heparin infusion for possible NSTEMI.    PT Comments    Patient received sitting edge of bed with family and sitter at bedside. He reports he walked with nursing, and is agreeable to walk again. Patient is able to stand with min  guard. Ambulated 150 feet with RW and min guard. Cues for gait speed and safety with RW. He was initially ambulating with slow pace. He requires cues to avoid obstacles in hall and for direction. Patient has made good progress since yesterday, but will benefit from continued skilled PT to improve strength, endurance and safety with mobility.        Recommendations for follow up therapy are one component of a multi-disciplinary discharge planning process, led by the attending physician.  Recommendations may be updated based on patient status, additional functional criteria and insurance authorization.  Follow Up Recommendations  Skilled nursing-short term rehab (<3 hours/day) Can patient physically be transported by private vehicle: Yes   Assistance Recommended at Discharge Frequent or constant Supervision/Assistance  Patient can return home with the following A little help with walking and/or transfers;A little help with bathing/dressing/bathroom;Assist for transportation;Help with stairs or ramp for entrance;Direct supervision/assist for medications management   Equipment Recommendations  Rolling walker (2 wheels)    Recommendations for Other Services       Precautions / Restrictions Precautions Precautions: Fall Restrictions Weight Bearing Restrictions: No     Mobility  Bed Mobility               General bed mobility comments: NT, patient seated on side of bed with wife and sitter present in room    Transfers Overall transfer level: Needs assistance Equipment used: Rolling walker (2 wheels)   Sit to Stand: Min assist  Ambulation/Gait Ambulation/Gait assistance: Min guard Gait Distance (Feet): 150 Feet Assistive device: Rolling walker (2 wheels) Gait Pattern/deviations: Step-through pattern, Decreased step length - right, Decreased step length - left Gait velocity: VERY slow initially, was able to increase speed with cues     General Gait  Details: improved mobility this date with RW   Stairs             Wheelchair Mobility    Modified Rankin (Stroke Patients Only)       Balance Overall balance assessment: Needs assistance Sitting-balance support: Feet supported Sitting balance-Leahy Scale: Fair     Standing balance support: Bilateral upper extremity supported, During functional activity, Reliant on assistive device for balance Standing balance-Leahy Scale: Fair Standing balance comment: high risk of falls                            Cognition Arousal/Alertness: Awake/alert Behavior During Therapy: WFL for tasks assessed/performed Overall Cognitive Status: Within Functional Limits for tasks assessed                                 General Comments: improved mentation from yesterday, but continues to have poor awareness        Exercises      General Comments        Pertinent Vitals/Pain Pain Assessment Pain Assessment: No/denies pain    Home Living                          Prior Function            PT Goals (current goals can now be found in the care plan section) Acute Rehab PT Goals PT Goal Formulation: With family Time For Goal Achievement: 05/13/22 Progress towards PT goals: Progressing toward goals    Frequency    Min 2X/week      PT Plan Current plan remains appropriate    Co-evaluation              AM-PAC PT "6 Clicks" Mobility   Outcome Measure  Help needed turning from your back to your side while in a flat bed without using bedrails?: A Little Help needed moving from lying on your back to sitting on the side of a flat bed without using bedrails?: A Little Help needed moving to and from a bed to a chair (including a wheelchair)?: A Little Help needed standing up from a chair using your arms (e.g., wheelchair or bedside chair)?: A Little Help needed to walk in hospital room?: A Little Help needed climbing 3-5 steps with a  railing? : A Lot 6 Click Score: 17    End of Session Equipment Utilized During Treatment: Gait belt Activity Tolerance: Patient tolerated treatment well Patient left: in bed;with call bell/phone within reach;with nursing/sitter in room;with family/visitor present Nurse Communication: Mobility status PT Visit Diagnosis: Unsteadiness on feet (R26.81);Other abnormalities of gait and mobility (R26.89);Muscle weakness (generalized) (M62.81);Difficulty in walking, not elsewhere classified (R26.2)     Time: 2836-6294 PT Time Calculation (min) (ACUTE ONLY): 12 min  Charges:  $Gait Training: 8-22 mins                     Zamauri Nez, PT, GCS 04/30/22,11:38 AM

## 2022-04-30 NOTE — TOC Progression Note (Signed)
Transition of Care [facility]) - Progression Note    Patient Details  Name: Jason Beck MRN: [id number] Date of Birth: 1955/06/10  [facility]) CM/SW [place], LCSW Phone Number: 04/30/2022, 8:55 AM  Clinical Narrative: Received email from [facility] liaison. Patient is not service connected and is therefore ineligible for [facility] SNF placement. Will need to see if he has other insurance.   Expected Discharge Plan: Home/Self Care Barriers to Discharge: Continued Medical Work up  Expected Discharge Plan and Services       Living arrangements for the past 2 months: Single Family Home                                       Social Determinants of Health (SDOH) Interventions SDOH Screenings   Food Insecurity: No Food Insecurity (04/27/2022)  Housing: Low Risk  (04/27/2022)  Transportation Needs: No Transportation Needs (04/27/2022)  Utilities: Not At Risk (04/27/2022)  Tobacco Use: High Risk (04/28/2022)    Readmission Risk Interventions    04/28/2022   11:30 AM  Readmission Risk Prevention Plan  Transportation Screening Complete  PCP or Specialist Appt within 3-5 Days Complete  HRI or [place] Patient refused  Social Work Consult for [place] Planning/Counseling Complete  Palliative Care Screening Not Applicable  Medication Review Press photographer) Complete

## 2022-04-30 NOTE — TOC Progression Note (Signed)
Transition of Care [facility]) - Progression Note    Patient Details  Name: Jason Beck MRN: [id number] Date of Birth: 1956-01-29  Transition of Care [facility]) CM/SW Contact  Laurena Slimmer, RN Phone Number: 04/30/2022, 11:28 AM  Clinical Narrative:    Spoke with patient's wife. She stated patient has [employer]. She would have to find his card. She requested to be called back.    Expected Discharge Plan: Home/Self Care Barriers to Discharge: Continued Medical Work up  Expected Discharge Plan and Services       Living arrangements for the past 2 months: Single Family Home                                       Social Determinants of Health (SDOH) Interventions SDOH Screenings   Food Insecurity: No Food Insecurity (04/27/2022)  Housing: Low Risk  (04/27/2022)  Transportation Needs: No Transportation Needs (04/27/2022)  Utilities: Not At Risk (04/27/2022)  Tobacco Use: High Risk (04/28/2022)    Readmission Risk Interventions    04/28/2022   11:30 AM  Readmission Risk Prevention Plan  Transportation Screening Complete  PCP or Specialist Appt within 3-5 Days Complete  HRI or [place] Patient refused  Social Work Consult for [place] Planning/Counseling Complete  Palliative Care Screening Not Applicable  Medication Review Press photographer) Complete

## 2022-05-01 ENCOUNTER — Inpatient Hospital Stay: Payer: No Typology Code available for payment source

## 2022-05-01 DIAGNOSIS — I214 Non-ST elevation (NSTEMI) myocardial infarction: Secondary | ICD-10-CM | POA: Diagnosis not present

## 2022-05-01 LAB — BASIC METABOLIC PANEL
Anion gap: 13 (ref 5–15)
BUN: 35 mg/dL — ABNORMAL HIGH (ref 8–23)
CO2: 21 mmol/L — ABNORMAL LOW (ref 22–32)
Calcium: 9 mg/dL (ref 8.9–10.3)
Chloride: 103 mmol/L (ref 98–111)
Creatinine, Ser: 1.6 mg/dL — ABNORMAL HIGH (ref 0.61–1.24)
GFR, Estimated: 47 mL/min — ABNORMAL LOW (ref >60)
Glucose, Bld: 121 mg/dL — ABNORMAL HIGH (ref 70–99)
Potassium: 3.6 mmol/L (ref 3.5–5.1)
Sodium: 137 mmol/L (ref 135–145)

## 2022-05-01 LAB — CBC
HCT: 40.7 % (ref 39.0–52.0)
Hemoglobin: 13.1 g/dL (ref 13.0–17.0)
MCH: 26.1 pg (ref 26.0–34.0)
MCHC: 32.2 g/dL (ref 30.0–36.0)
MCV: 81.2 fL (ref 80.0–100.0)
Platelets: 309 10*3/uL (ref 150–400)
RBC: 5.01 MIL/uL (ref 4.22–5.81)
RDW: 16.2 % — ABNORMAL HIGH (ref 11.5–15.5)
WBC: 14.2 10*3/uL — ABNORMAL HIGH (ref 4.0–10.5)
nRBC: 0 % (ref 0.0–0.2)

## 2022-05-01 LAB — GLUCOSE, CAPILLARY
Glucose-Capillary: 143 mg/dL — ABNORMAL HIGH (ref 70–99)
Glucose-Capillary: 129 mg/dL — ABNORMAL HIGH (ref 70–99)
Glucose-Capillary: 126 mg/dL — ABNORMAL HIGH (ref 70–99)
Glucose-Capillary: 134 mg/dL — ABNORMAL HIGH (ref 70–99)

## 2022-05-01 MED ORDER — MIDODRINE HCL 5 MG PO TABS
5.0000 mg | ORAL_TABLET | Freq: Once | ORAL | Status: AC
  Administered 2022-05-01: 5 mg via ORAL
  Filled 2022-05-01: qty 1

## 2022-05-01 MED ORDER — SODIUM CHLORIDE 0.9 % IV SOLN: INTRAVENOUS | Status: AC

## 2022-05-01 MED ORDER — LACTATED RINGERS IV BOLUS
250.0000 mL | Freq: Once | INTRAVENOUS | Status: AC
  Administered 2022-05-01: 250 mL via INTRAVENOUS

## 2022-05-01 MED ORDER — SODIUM CHLORIDE 0.9 % IV BOLUS
500.0000 mL | Freq: Once | INTRAVENOUS | Status: AC
  Administered 2022-05-01: 500 mL via INTRAVENOUS

## 2022-05-01 MED ORDER — METOPROLOL TARTRATE 5 MG/5ML IV SOLN
5.0000 mg | Freq: Once | INTRAVENOUS | Status: AC
  Administered 2022-05-02: 5 mg via INTRAVENOUS
  Filled 2022-05-01: qty 5

## 2022-05-01 NOTE — TOC Progression Note (Addendum)
Transition of Care [facility]) - Progression Note    Patient Details  Name: Jason Beck MRN: [id number] Date of Birth: 05/12/1955  Transition of Care [facility]) CM/SW Contact  Laurena Slimmer, RN Phone Number: 05/01/2022, 11:37 AM  Clinical Narrative: Email sent to financial counselor 1/12 to verify payor. Per [employer] counselor,patient has [employer].      Spoke with patient's wife, Jason Beck at bedside. Patient wife voiced her concern about not knowing specifically how much should would have to pay out of pocket for patient's stay at SNF. She did not call the insurance company as she had stated she would on yesterday evening. She requested RNCM call [employer] and [facility] to get specific information about out of pocket cost. Jason Beck stated, patient's sister, Jason Beck informed her TOC would provide that information. Jason Beck was advised correspondence from [facility] with insurance companies includes approvals  and denials only. Patient wife advised for specific billing, payments or out of pocket expense information she or family member/ patient representative would need to call on behalf of the patient. She was also advised patient's recommendation from [facility] therapy department was STR. Explained co-payment days after day 20 at the SNF. Reiterated to patient's  wife  to contact  the insurance company directly for out of pocket inquiries. She was also encourage to contact SNF for more information about associated costs during patient's stay. Jason Beck is agreeable to SNF but is demanding specific billing and cost information. She provided a choice list for [facility], [facility], and [facility]. She was advised [facility] would not be an option due to patient not being a resident prior to admitting to [facility]. She requested to speak to [facility] supervisor. ENorval Morton, TOC Supervisor notified of [name]'s request. She requested to be called at home and provided home phone  [phone]. Jason Beck also request that Jason Beck be  updated.  12:10pm Contacted patient's sister, Jason Beck @ [phone]. Confirmed with Jason Beck patient and Jason Beck are legally married and that Jason Beck could be updated. Jason Beck provided detail of conversation with Jason Beck including choices of facilities, process of obtaining a bed and approvals. Jason Beck advised about [facility] SN[facility] vs other SNF. She was advised patient is not service connected. She was also advised that billing and out of pocket specifics would have to be obtained by the insurance company directly by a patient representative such as his spouse or family member due to not being able to represent himself at this time. Jason Beck verbalized her understanding of the information provided. She was informed she would be updated by a TOC representative about bed offers for choice once received and that insurance auth would proceed that step.  Jason Beck requesting medical update. MD notified.   Expected Discharge Plan: Home/Self Care Barriers to Discharge: Continued Medical Work up  Expected Discharge Plan and Services       Living arrangements for the past 2 months: Single Family Home                                       Social Determinants of Health (SDOH) Interventions SDOH Screenings   Food Insecurity: No Food Insecurity (04/27/2022)  Housing: Low Risk  (04/27/2022)  Transportation Needs: No Transportation Needs (04/27/2022)  Utilities: Not At Risk (04/27/2022)  Tobacco Use: High Risk (04/28/2022)    Readmission Risk Interventions    04/28/2022   11:30 AM  Readmission Risk Prevention Plan  Transportation Screening  Complete  PCP or Specialist Appt within 3-5 Days Complete  HRI or Home Care Consult Patient refused  Social Work Consult for [place] Planning/Counseling Complete  Palliative Care Screening Not Applicable  Medication Review Press photographer) Complete

## 2022-05-01 NOTE — NC FL2 (Signed)
[place] LEVEL OF CARE FORM     IDENTIFICATION  Patient Name: Jason Beck Birthdate: 1955-08-19 Sex: male Admission Date (Current Location): 04/25/2022  [facility] and [place] Number:  Engineering geologist and Address:  [facility], [address] [place], [place] [address]      Provider Number: [id number]  Attending Physician Name and Address:  Ezekiel Slocumb, DO  Relative Name and Phone Number:  Tristain Daily, [phone]    Current Level of Care: Hospital Recommended Level of Care: [place] Prior Approval Number:    Date Approved/Denied:   PASRR Number: [id number]  Discharge Plan: SNF    Current Diagnoses: Patient Active Problem List   Diagnosis Date Noted   Aortic valve stenosis 04/28/2022   Acute pulmonary edema ([place]) 04/27/2022   Chronic atrial fibrillation ([place]) 04/27/2022   Spiculated nodule of upper lobe of left lung CT 04/25/21 04/26/2022   History of lung cancer 04/25/2022   PVD (peripheral vascular disease) ([place]) 04/25/2022   Alcohol use disorder, in remission 04/25/2022   Tobacco use disorder 04/25/2022   Paroxysmal atrial fibrillation with RVR ([place]) 04/19/2022   Acute on chronic combined systolic and diastolic CHF (congestive heart failure) ([place]) 04/19/2022   Essential hypertension 04/19/2022   Asthma, chronic 04/19/2022   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 ([place]) 04/19/2022   Chronic pain 04/19/2022   NSTEMI (non-ST elevated myocardial infarction) ([place]) 03/21/2022   Type 2 diabetes mellitus with peripheral neuropathy ([place]) 03/21/2022   Dyslipidemia 03/21/2022   Depression 03/21/2022   Chronic obstructive pulmonary disease (COPD) ([place]) 03/21/2022   Acute CHF (congestive heart failure) ([place]) 03/21/2022   New onset atrial fibrillation ([place]) 03/21/2022    Orientation RESPIRATION BLADDER Height & Weight     Self, Time, Situation, Place  Normal External catheter  Weight: 63.7 kg Height:     BEHAVIORAL SYMPTOMS/MOOD NEUROLOGICAL BOWEL NUTRITION STATUS      Continent Diet (Heart)  AMBULATORY STATUS COMMUNICATION OF NEEDS Skin   Limited Assist Non-Verbally Bruising (Ecchymosis L groin Erythema R and L leg)                       Personal Care Assistance Level of Assistance  Bathing, Dressing Bathing Assistance: Limited assistance   Dressing Assistance: Limited assistance     Functional Limitations Info  Sight Sight Info: Impaired        SPECIAL CARE FACTORS FREQUENCY  PT (By licensed PT), OT (By licensed OT)                    Contractures Contractures Info: Present    Additional Factors Info  Code Status, Allergies Code Status Info: Full Allergies Info: Niacin, Penicillin G           Current Medications (05/01/2022):  This is the current hospital active medication list Current Facility-Administered Medications  Medication Dose Route Frequency Provider Last Rate Last Admin   0.9 %  sodium chloride infusion  250 mL Intravenous PRN Kathlyn Sacramento A, MD       0.9 %  sodium chloride infusion   Intravenous Continuous Nicole Kindred A, DO 75 mL/hr at 05/01/22 1537 Infusion Verify at 05/01/22 1537   acetaminophen (TYLENOL) tablet 650 mg  650 mg Oral Q4H PRN Oswald Hillock, RPH       amiodarone (PACERONE) tablet 400 mg  400 mg Oral BID Scoggins, Amber, NP   400 mg at 05/01/22  5361   apixaban (ELIQUIS) tablet 5 mg  5 mg Oral BID Scoggins, Amber, NP   5 mg at 05/01/22 0951   atorvastatin (LIPITOR) tablet 80 mg  80 mg Oral Daily Kathlyn Sacramento A, MD   80 mg at 05/01/22 0953   clopidogrel (PLAVIX) tablet 75 mg  75 mg Oral Daily Kathlyn Sacramento A, MD   75 mg at 05/01/22 4431   empagliflozin (JARDIANCE) tablet 10 mg  10 mg Oral Daily Richarda Osmond, MD   10 mg at 54/00/86 7619   folic acid (FOLVITE) tablet 1 mg  1 mg Oral Daily Kathlyn Sacramento A, MD   1 mg at 05/01/22 0953   insulin aspart (novoLOG) injection 0-9 Units  0-9  Units Subcutaneous TID WC Wellington Hampshire, MD   1 Units at 05/01/22 1224   methadone (DOLOPHINE) tablet 5 mg  5 mg Oral QHS Kathlyn Sacramento A, MD   5 mg at 04/30/22 2043   metoprolol succinate (TOPROL-XL) 24 hr tablet 50 mg  50 mg Oral Daily Scoggins, Amber, NP       mometasone-formoterol (DULERA) 200-5 MCG/ACT inhaler 2 puff  2 puff Inhalation BID Kathlyn Sacramento A, MD   2 puff at 05/01/22 0801   nitroGLYCERIN (NITROSTAT) SL tablet 0.4 mg  0.4 mg Sublingual Q5 Min x 3 PRN Kathlyn Sacramento A, MD   0.4 mg at 04/26/22 0133   ondansetron (ZOFRAN) injection 4 mg  4 mg Intravenous Q6H PRN Wellington Hampshire, MD       oxyCODONE (Oxy IR/ROXICODONE) immediate release tablet 5 mg  5 mg Oral Q4H PRN Wellington Hampshire, MD   5 mg at 04/30/22 2059   QUEtiapine (SEROQUEL) tablet 50 mg  50 mg Oral QHS Richarda Osmond, MD   50 mg at 04/30/22 2043   sodium chloride flush (NS) 0.9 % injection 3 mL  3 mL Intravenous Q12H Kathlyn Sacramento A, MD   3 mL at 05/01/22 0955   sodium chloride flush (NS) 0.9 % injection 3 mL  3 mL Intravenous PRN Wellington Hampshire, MD       thiamine (VITAMIN B1) tablet 100 mg  100 mg Oral Daily Kathlyn Sacramento A, MD   100 mg at 05/01/22 5093   Or   thiamine (VITAMIN B1) injection 100 mg  100 mg Intravenous Daily Wellington Hampshire, MD       tiotropium (SPIRIVA) inhalation capsule ([facility] use ONLY) 18 mcg  18 mcg Inhalation Daily Wellington Hampshire, MD   18 mcg at 05/01/22 0806   varenicline (CHANTIX) tablet 0.5 mg  0.5 mg Oral Q breakfast Richarda Osmond, MD   0.5 mg at 05/01/22 2671     Discharge Medications: Please see discharge summary for a list of discharge medications.  Relevant Imaging Results:  Relevant Lab Results:   Additional Information SS# [id number]  Laurena Slimmer, RN

## 2022-05-01 NOTE — TOC Progression Note (Signed)
Transition of Care [facility]) - Progression Note    Patient Details  Name: Jason Beck MRN: [id number] Date of Birth: [birth year]  Transition of [facility]) CM/SW Contact  Ross Ludwig, [place] Phone Number: 05/01/2022, 4:00 PM  Clinical Narrative:     This Probation officer spoke to patient's wife [name], [phone] per request from [facility] member.  This Probation officer explained to patient's wife the process for trying to get approval for SNF placement.  This Probation officer explained to wife that [employer] is easier to get approval for patient and they have many more facilities in network with insurance compared to [employer].  This Probation officer explained that patient may or may not be approved, if he is not approved the options are to appeal, or pay privately.  Patient's wife expressed understanding.  Patient's wife asked if they would be eligible for Medicaid,  this writer informed her that she would have to contact [facility] to see if they are eligible.  This Probation officer also explained to her that once patient goes to a SNF there is a Education officer, museum who can assist with disposition planning after rehab.  She was also explained that insurance will only pay for short term rehab if approved.  Informed wife that once rehab is completed or insurance says patient no longer qualifies, then home health can be set up by the social worker at [facility].  Patient's wife expressed that it is okay to speak to her sister in law [name] as needed.  Per patient's wife, [name] has been helping them navigate the health care.  This Probation officer explained to her that once bed offers are provided, she has to make a decision so insurance auth can be started.  This Probation officer said it is important to be prepared for the choice so patient can be discharged in a timely manner once he is medically ready for discharge.  Patient's wife expressed appreciation for information given to her.  She stated she understands the process better now then before.  Expected Discharge  Plan: Home/Self Care Barriers to Discharge: Continued Medical Work up  Expected Discharge Plan and Services       Living arrangements for the past 2 months: Single Family Home                                       Social Determinants of Health (SDOH) Interventions SDOH Screenings   Food Insecurity: No Food Insecurity (04/27/2022)  Housing: Low Risk  (04/27/2022)  Transportation Needs: No Transportation Needs (04/27/2022)  Utilities: Not At Risk (04/27/2022)  Tobacco Use: High Risk (04/28/2022)    Readmission Risk Interventions    04/28/2022   11:30 AM  Readmission Risk Prevention Plan  Transportation Screening Complete  PCP or Specialist Appt within 3-5 Days Complete  HRI or [place] Patient refused  Social Work Consult for [place] Planning/Counseling Complete  Palliative Care Screening Not Applicable  Medication Review Press photographer) Complete

## 2022-05-01 NOTE — Progress Notes (Addendum)
Progress Note   Patient: Jason Beck DDU:[id number] DOB: 1955/10/04 DOA: 04/25/2022     6 DOS: the patient was seen and examined on 05/01/2022   Brief hospital course: 67 y.o. 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  He complains of intermittent sharp right-sided chest pain.  Denies lower extremity edema or orthopnea.  He is compliant with his Eliquis.  Has had no cough, fever or chills.  States he felt so poorly he did not take his medications today.  He was brought in by EMS who reported a heart rate of 80-1 30 with otherwise normal vitals.    ED course and data review: On arrival, afebrile BP 112/97 with pulse 122 and otherwise normal vitals.  Troponin 245 with BNP 791.9.  WBC and respiratory viral panel negative for COVID flu and RSV.  Hemoglobin at baseline at 11.8.  BMP unremarkable.  EKG, personally viewed and interpreted showing A-fib at 122 with nonspecific ST-T wave changes. Chest x-ray showed mild cardiomegaly and pulmonary vascular congestion.  No focal consolidation or large pleural effusion. Patient was administered his home metoprolol with improvement in heart rate to 99 by admission.  He was also treated with a dose of IV Lasix for possible CHF and started on heparin infusion for possible NSTEMI.    1/7 : Patient had mild uptrend in high-sensitivity troponins however asymptomatic with no chest pain or palpitation/diaphoresis.    1/8- remains hemodynamically stable.  Cardiology performed cath. Identified to have non-obstructive disease with Afib and significant aortic stenosis thought to be contributing factors to his presenting symptoms.  1/9- cardiology following for management of Afib and evaluation for AV replacement  1/10-1/11- stable from cardiac standpoint. PT evaluated and recommended SNF at dc. Awaiting SNF placement. Appears to be suffering from acute grief vs delirium.   Assessment and Plan: NSTEMI-  Patient presents with fatigue and dyspnea on exertion with troponin 245  Has right sided chest pain and with nonacute EKG Cath performed on 1/8 without intervention. Nonobstructive disease.  - cardiology consulted, Dr. Humphrey Rolls - Transitioned from heparin infusion >> Eliquis - Continue metoprolol and atorvastatin. - started Entresto - now on hold due to hypotension  Hypotension- suspect hypovolemic due to poor PO intake and possibly over-diuresis. -250 cc bolus fluids this AM with fairly good response -MAPs have been stable -will run NS @ 75 cc/hr x 1 day -maintain MAP > 65 -Hold Entresto and Lasix (orders d/c'd) -Suspect his BP will not tolerate Entresto  HFrEF  HTN- echo from 12/2 shows EF 40 to 45% with G1 DD. Echo from 1/8 reveals worsening function. EF now 35-40% with moderate decreased function, severe dilation of LV, LA and RA. Moderate to severe aortic stenosis.  Dyspnea on exertion with BNP 791 and pulmonary vascular congestion on chest x-ray at admission. Now appears dry, low BP's, possibly over-diuresed and from [place].  - Continue metoprolol - Hold Entresto and Lasix - Daily weights with intake and output monitoring - started on SGLT2i ([place]) - PT/OT evaluation-  recommending SNF placement.   AKI- Cr baseline around 0.9. Cr rising past couple of days 1.42 >> 1.60 today Likely due to over-diuresis and poor PO intake, Entresto. - Hold Lasix and Entresto - Very gentle IV hydration x 1 day - BMP am  Leukocytosis - new  1/12, WBC 14, previously normal.  No fevers. Pt confused and poor historian, unable to elicit acute complaints for s/sx's of infection.  Does have lower BP today, started fluids. - Monitor CBC - Check UA, CXR for possible infection  Paroxysmal atrial fibrillation with RVR - Rate normal  Heart rate 122 on arrival in the setting of missing medication on the day of arrival Recently hospitalized from 12/31 through 04/20/2022 with rapid A-fib attributed to medication noncompliance, Improved with home dose of metoprolol - Continue metoprolol - Continue PO amiodarone  - Explore barriers to medication compliance. Rece[facility] at [facility] hospital.   Aortic stenosis- per early cardiology notes, if patient stabilizes from Afib standpoint on amiodarone, will be evaluated for possible aortic valve replacement.  - continue to follow up with cardiology attending.   Type 2 diabetes mellitus with peripheral neuropathy (HCC) Controlled.  Last A1c 6.4 on 12/31 Sliding scale insulin coverage while hospitalized - started SGLT2i  Tobacco use disorder - Nicotine patch discontinued - start chantix instead to avoid cardiac implication with nicotine replacement therapy  Alcohol use disorder, in remission 12 pack beer daily, quit cold [place] weeks prior to admission and has no withdrawals.  Became overly sedated ativan dose given.  - hold ativan unless needed for anxiety/withdrawal but appears to be outside withdrawal window.  - monitor CIWA  Dementia with acute intermittent delirium  acute grief vs depression - chaplain consult - consider psych consult - continue seroquel QHS - palliative consult  PVD (peripheral vascular disease) (HCC) Continue aspirin and atorvastatin History of amputation digits right foot  History of lung cancer- cta positive for Spiculated mixed solid and cystic nodule within the anterior left upper lobe measuring 18 mm, indeterminate. Will need follow up imaging in 3 months or  sooner. Patient receives his oncology care through [place]. Unsure of the significance of the current findings vs what has been present on monitoring images prior to admission due to not having access to [place] chart. Discussed the findings with patient and his sister at bedside and recommended they discuss with oncology at follow up appointment. They endorsed understanding and agreement.  S/p radiation at the [employer] 2019 No acute issues suspected - continue oxygen PRN  Chronic pain Continue methadone and oxycodone  COPD- Not acutely exacerbated Continue home inhalers DuoNebs as needed    Subjective: Patient seen and examined this morning.  He does not speak much or interact, seems frustrated.  Also seems confused.  When I asked how he feels, he stated "lousy".  When I asked him to be more specific he said "my wife".  He does not elaborate on specific acute complaints.  Low BP's this AM, nurse states he's been denies feeling dizzy or lightheaded.  Reported poor PO intake.   Physical Exam: Vitals:   05/01/22 0959 05/01/22 1145 05/01/22 1233 05/01/22 1247  BP: 95/65 (!) 84/61 90/70   Pulse: 72 (!) 57 72   Resp:  17    Temp:  98.2 F (36.8 C)    TempSrc:      SpO2: 96% 95% 93%   Weight:    63.7 kg   General exam: awake, alert, no acute distress, chronically ill-appearing, confused HEENT:  moist mucus membranes, hearing grossly normal  Respiratory system: CTAB but generally diminished throughout, no wheezes, normal respiratory effort. Cardiovascular system: normal S1/S2, RRR, no pedal edema.   Gastrointestinal system: soft, NT, ND, no HSM felt, +bowel sounds. Central nervous system: Unable to evaluate due to lack of cooperation from patient, normal speech Extremities: moves all, no edema, normal tone Skin: dry, intact, normal temperature, poor skin turgor Psychiatry: Minimally interactive, normal mood, flat affect, abnormal judgement and insight      Data Reviewed: CBC    Component Value  Date/Time   WBC 14.2 (H) 05/01/2022 0418   RBC 5.01 05/01/2022 0418   HGB 13.1 05/01/2022 0418   HCT 40.7 05/01/2022 0418   PLT 309 05/01/2022 0418   MCV 81.2 05/01/2022 0418   MCH 26.1 05/01/2022 0418   MCHC 32.2 05/01/2022 0418   RDW 16.2 (H) 05/01/2022 0418   LYMPHSABS 2.5 04/19/2022 0105   MONOABS 0.7 04/19/2022 0105   EOSABS 0.2 04/19/2022 0105   BASOSABS 0.0 04/19/2022 0105      Latest Ref Rng & Units 05/01/2022    4:18 AM 04/30/2022    2:40 AM 04/29/2022    4:32 AM  BMP  Glucose 70 - 99 mg/dL 235  573  220   BUN 8 - 23 mg/dL 35  25  18   Creatinine 0.61 - 1.24 mg/dL 2.54  2.70  6.23   Sodium 135 - 145 mmol/L 137  140  140   Potassium 3.5 - 5.1 mmol/L 3.6  3.9  3.4   Chloride 98 - 111 mmol/L 103  103  102   CO2 22 - 32 mmol/L 21  22  25    Calcium 8.9 - 10.3 mg/dL 9.0  9.7  9.6     Family Communication: none at bedside, will attempt to call. Sister requested update per [facility], attempted to call but got voicemail. Will attempt again later.  Disposition: Status is: Inpatient  Remains inpatient appropriate because: Hypotension and poor PO intake, requiring IV fluids.  Awaiting SNF placement.   Planned Discharge Destination: SNF  Time spent: 45 minutes  Author: [facility], DO 05/01/2022 1:07 PM  For on call review [email or web address].

## 2022-05-01 NOTE — Consult Note (Signed)
  Amiodarone Drug - Drug Interaction Consult Note  Recommendations: Patient is not on any medications at risk for QT prolongation. Pt is on metoprolol which can cause bradycardia and was on lasix, currently held, which can decrease potassium. Monitor HR and K+/Mg levels while on amiodarone.   Amiodarone is metabolized by the cytochrome P450 system and therefore has the potential to cause many drug interactions. Amiodarone has an average plasma half-life of 50 days (range 20 to 100 days).   There is potential for drug interactions to occur several weeks or months after stopping treatment and the onset of drug interactions may be slow after initiating amiodarone.   []  Statins: Increased risk of myopathy. Simvastatin- restrict dose to 20mg  daily. Other statins: counsel patients to report any muscle pain or weakness immediately.  []  Anticoagulants: Amiodarone can increase anticoagulant effect. Consider warfarin dose reduction. Patients should be monitored closely and the dose of anticoagulant altered accordingly, remembering that amiodarone levels take several weeks to stabilize.  []  Antiepileptics: Amiodarone can increase plasma concentration of phenytoin, the dose should be reduced. Note that small changes in phenytoin dose can result in large changes in levels. Monitor patient and counsel on signs of toxicity.  [x]  Beta blockers: increased risk of bradycardia, AV block and myocardial depression. Sotalol - avoid concomitant use.  []   Calcium channel blockers (diltiazem and verapamil): increased risk of bradycardia, AV block and myocardial depression.  []   Cyclosporine: Amiodarone increases levels of cyclosporine. Reduced dose of cyclosporine is recommended.  []  Digoxin dose should be halved when amiodarone is started.  [x]  Diuretics: increased risk of cardiotoxicity if hypokalemia occurs.  []  Oral hypoglycemic agents (glyburide, glipizide, glimepiride): increased risk of hypoglycemia.  Patient's glucose levels should be monitored closely when initiating amiodarone therapy.   []  Drugs that prolong the QT interval:  Torsades de pointes risk may be increased with concurrent use - avoid if possible.  Monitor QTc, also keep magnesium/potassium WNL if concurrent therapy can't be avoided.  Antibiotics: e.g. fluoroquinolones, erythromycin.  Antiarrhythmics: e.g. quinidine, procainamide, disopyramide, sotalol.  Antipsychotics: e.g. phenothiazines, haloperidol.   Lithium, tricyclic antidepressants, and methadone. Thank You,  Oswald Hillock  05/01/2022 10:03 AM

## 2022-05-01 NOTE — Progress Notes (Signed)
CROSS COVER NOTE  NAME: Jason Beck MRN: [id number] DOB : March 14, 1956   HPI/Events of Note   A fib RVR  130s with hypotension  Assessment and  Interventions   Assessment: EKG reviewed by me confirms afib with RVR 130 Current BP 72/54 AKI on amlabs with creat now at 1.6 from 0.9-1 baseline White count  this am with leukocytosis Plan: 500 cc NS bolus Midodrine 5 mg x1  Check CMP and mag Restart continuous cardiac monitoring 5 mg IV metoprolol Chest xray - unchanged mild congestion Sepsis criteria with hypotension and tachycardia with leukocytosisLactic acid, blood cultures, ua/culture, procal Start broad spectrum  antibiotics vanc and cefepime  Mrsa screen     Kathlene Cote NP [facility]  Patient became agitated as pressure improved with fluid bolus. Haldol 5 mg IV given 1 time for combativeness. Procal and lactic acid normal - antibiotics discontinued

## 2022-05-01 NOTE — TOC Progression Note (Signed)
Transition of Care [facility]) - Progression Note    Patient Details  Name: Jason Beck MRN: [id number] Date of Birth: [birth year]  [facility] CM/SW Contact  Laurena Slimmer, RN Phone Number: 05/01/2022, 4:13 PM  Clinical Narrative:     [place] [facility] obtained FL2 completed. Bed search initiated  Expected Discharge Plan: Home/Self Care Barriers to Discharge: Continued Medical Work up  Expected Discharge Plan and Services       Living arrangements for the past 2 months: Single Family Home                                       Social Determinants of Health (SDOH) Interventions SDOH Screenings   Food Insecurity: No Food Insecurity (04/27/2022)  Housing: Low Risk  (04/27/2022)  Transportation Needs: No Transportation Needs (04/27/2022)  Utilities: Not At Risk (04/27/2022)  Tobacco Use: High Risk (04/28/2022)    Readmission Risk Interventions    04/28/2022   11:30 AM  Readmission Risk Prevention Plan  Transportation Screening Complete  PCP or Specialist Appt within 3-5 Days Complete  HRI or [place] Patient refused  Social Work Consult for [place] Planning/Counseling Complete  Palliative Care Screening Not Applicable  Medication Review [employer]) Complete

## 2022-05-01 NOTE — Progress Notes (Signed)
   04/30/22 2358  Vitals  BP (!) 86/54  BP Location Right Arm  BP Method Manual  MEWS COLOR  MEWS Score Color Green  MEWS Score  MEWS Temp 0  MEWS Systolic 1  MEWS Pulse 0  MEWS RR 0  MEWS LOC 0  MEWS Score 1   Provider made aware. No new orders. Pt MAP on prior BP 70 HR 74. PT denies symptoms.

## 2022-05-01 NOTE — Progress Notes (Incomplete)
CROSS COVER NOTE  NAME: Jason Beck MRN: [id number] DOB : [birth year]   HPI/Events of Note   A fib RVR  130s with hypotension  Assessment and  Interventions   Assessment: EKG reviewed by me confirms afib with RVR 130 Current BP 72/54 AKI on amlabs with creat now at 1.6 from 0.9-1 baseline White count  this am with leukocytosis Plan: 500 cc NS bolus Midodrine 5 mg x1  Check CMP and mag Restart continuous cardiac monitoring 5 mg IV metoprolol Chest xray Sepsis criteria with hypotension and tachycardia with leukocytosisLactic acid, blood cultures, ua/culture, procal Start broad spectrum  antibiotics vanc and cefepime  Mrsa screen     Kathlene Cote NP [facility]

## 2022-05-02 DIAGNOSIS — I214 Non-ST elevation (NSTEMI) myocardial infarction: Secondary | ICD-10-CM | POA: Diagnosis not present

## 2022-05-02 DIAGNOSIS — I5043 Acute on chronic combined systolic (congestive) and diastolic (congestive) heart failure: Secondary | ICD-10-CM | POA: Diagnosis not present

## 2022-05-02 DIAGNOSIS — I480 Paroxysmal atrial fibrillation: Secondary | ICD-10-CM

## 2022-05-02 DIAGNOSIS — I482 Chronic atrial fibrillation, unspecified: Secondary | ICD-10-CM | POA: Diagnosis not present

## 2022-05-02 LAB — GLUCOSE, CAPILLARY
Glucose-Capillary: 96 mg/dL (ref 70–99)
Glucose-Capillary: 174 mg/dL — ABNORMAL HIGH (ref 70–99)
Glucose-Capillary: 131 mg/dL — ABNORMAL HIGH (ref 70–99)
Glucose-Capillary: 124 mg/dL — ABNORMAL HIGH (ref 70–99)

## 2022-05-02 LAB — BLOOD GAS, VENOUS
Acid-base deficit: 3.3 mmol/L — ABNORMAL HIGH (ref 0.0–2.0)
Bicarbonate: 19.9 mmol/L — ABNORMAL LOW (ref 20.0–28.0)
O2 Saturation: 78.8 %
Patient temperature: 37
pCO2, Ven: 30 mmHg — ABNORMAL LOW (ref 44–60)
pH, Ven: 7.43 (ref 7.25–7.43)
pO2, Ven: 52 mmHg — ABNORMAL HIGH (ref 32–45)

## 2022-05-02 LAB — BASIC METABOLIC PANEL
Anion gap: 11 (ref 5–15)
BUN: 40 mg/dL — ABNORMAL HIGH (ref 8–23)
CO2: 18 mmol/L — ABNORMAL LOW (ref 22–32)
Calcium: 8.6 mg/dL — ABNORMAL LOW (ref 8.9–10.3)
Chloride: 107 mmol/L (ref 98–111)
Creatinine, Ser: 1.43 mg/dL — ABNORMAL HIGH (ref 0.61–1.24)
GFR, Estimated: 54 mL/min — ABNORMAL LOW (ref >60)
Glucose, Bld: 129 mg/dL — ABNORMAL HIGH (ref 70–99)
Potassium: 3.8 mmol/L (ref 3.5–5.1)
Sodium: 136 mmol/L (ref 135–145)

## 2022-05-02 LAB — URINALYSIS, ROUTINE W REFLEX MICROSCOPIC
Bacteria, UA: NONE SEEN
Bilirubin Urine: NEGATIVE
Glucose, UA: >=500 mg/dL — AB
Hgb urine dipstick: NEGATIVE
Ketones, ur: NEGATIVE mg/dL
Leukocytes,Ua: NEGATIVE
Nitrite: NEGATIVE
Protein, ur: 30 mg/dL — AB
Specific Gravity, Urine: 1.023 (ref 1.005–1.030)
Squamous Epithelial / HPF: NONE SEEN /HPF (ref 0–5)
pH: 5 (ref 5.0–8.0)

## 2022-05-02 LAB — COMPREHENSIVE METABOLIC PANEL
ALT: 16 U/L (ref 0–44)
AST: 23 U/L (ref 15–41)
Albumin: 3.5 g/dL (ref 3.5–5.0)
Alkaline Phosphatase: 56 U/L (ref 38–126)
Anion gap: 12 (ref 5–15)
BUN: 40 mg/dL — ABNORMAL HIGH (ref 8–23)
CO2: 19 mmol/L — ABNORMAL LOW (ref 22–32)
Calcium: 8.5 mg/dL — ABNORMAL LOW (ref 8.9–10.3)
Chloride: 104 mmol/L (ref 98–111)
Creatinine, Ser: 1.55 mg/dL — ABNORMAL HIGH (ref 0.61–1.24)
GFR, Estimated: 49 mL/min — ABNORMAL LOW (ref >60)
Glucose, Bld: 123 mg/dL — ABNORMAL HIGH (ref 70–99)
Potassium: 4.1 mmol/L (ref 3.5–5.1)
Sodium: 135 mmol/L (ref 135–145)
Total Bilirubin: 2.2 mg/dL — ABNORMAL HIGH (ref 0.3–1.2)
Total Protein: 7.1 g/dL (ref 6.5–8.1)

## 2022-05-02 LAB — CBC
HCT: 37.6 % — ABNORMAL LOW (ref 39.0–52.0)
Hemoglobin: 12.1 g/dL — ABNORMAL LOW (ref 13.0–17.0)
MCH: 26.4 pg (ref 26.0–34.0)
MCHC: 32.2 g/dL (ref 30.0–36.0)
MCV: 81.9 fL (ref 80.0–100.0)
Platelets: 247 10*3/uL (ref 150–400)
RBC: 4.59 MIL/uL (ref 4.22–5.81)
RDW: 16.4 % — ABNORMAL HIGH (ref 11.5–15.5)
WBC: 9.9 10*3/uL (ref 4.0–10.5)
nRBC: 0 % (ref 0.0–0.2)

## 2022-05-02 LAB — PROCALCITONIN
Procalcitonin: <0.1 ng/mL
Procalcitonin: <0.1 ng/mL

## 2022-05-02 LAB — MAGNESIUM
Magnesium: 2 mg/dL (ref 1.7–2.4)
Magnesium: 1.8 mg/dL (ref 1.7–2.4)

## 2022-05-02 LAB — MRSA NEXT GEN BY PCR, NASAL: MRSA by PCR Next Gen: NOT DETECTED

## 2022-05-02 LAB — LACTIC ACID, PLASMA
Lactic Acid, Venous: 1.1 mmol/L (ref 0.5–1.9)
Lactic Acid, Venous: 1.4 mmol/L (ref 0.5–1.9)

## 2022-05-02 LAB — PHOSPHORUS: Phosphorus: 3.9 mg/dL (ref 2.5–4.6)

## 2022-05-02 MED ORDER — SODIUM CHLORIDE 0.9 % IV SOLN
2.0000 g | Freq: Two times a day (BID) | INTRAVENOUS | Status: DC
  Administered 2022-05-02: 2 g via INTRAVENOUS
  Filled 2022-05-02: qty 12.5

## 2022-05-02 MED ORDER — VANCOMYCIN HCL 750 MG/150ML IV SOLN: 750.0000 mg | INTRAVENOUS | Status: DC

## 2022-05-02 MED ORDER — HALOPERIDOL LACTATE 5 MG/ML IJ SOLN
INTRAMUSCULAR | Status: AC
  Administered 2022-05-02: 5 mg via INTRAVENOUS
  Filled 2022-05-02: qty 1

## 2022-05-02 MED ORDER — HALOPERIDOL LACTATE 5 MG/ML IJ SOLN: 5.0000 mg | Freq: Once | INTRAMUSCULAR | Status: AC

## 2022-05-02 MED ORDER — MIDODRINE HCL 5 MG PO TABS
10.0000 mg | ORAL_TABLET | Freq: Three times a day (TID) | ORAL | Status: DC
  Administered 2022-05-02 – 2022-05-05 (×8): 10 mg via ORAL
  Filled 2022-05-02 (×9): qty 2

## 2022-05-02 MED ORDER — MIDODRINE HCL 5 MG PO TABS
5.0000 mg | ORAL_TABLET | Freq: Two times a day (BID) | ORAL | Status: DC
  Administered 2022-05-02: 5 mg via ORAL
  Filled 2022-05-02: qty 1

## 2022-05-02 MED ORDER — VANCOMYCIN HCL 1500 MG/300ML IV SOLN
1500.0000 mg | Freq: Once | INTRAVENOUS | Status: AC
  Administered 2022-05-02: 1500 mg via INTRAVENOUS
  Filled 2022-05-02: qty 300

## 2022-05-02 MED ORDER — LACTATED RINGERS IV BOLUS
500.0000 mL | Freq: Once | INTRAVENOUS | Status: AC
  Administered 2022-05-02: 500 mL via INTRAVENOUS

## 2022-05-02 NOTE — Progress Notes (Addendum)
Progress Note   Patient: Jason Beck LOV:[id number] DOB: 1955/08/16 DOA: 04/25/2022     7 DOS: the patient was seen and examined on 05/02/2022   Brief hospital course: 67 y.o. 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  He complains of intermittent sharp right-sided chest pain.  Denies lower extremity edema or orthopnea.  He is compliant with his Eliquis.  Has had no cough, fever or chills.  States he felt so poorly he did not take his medications today.  He was brought in by EMS who reported a heart rate of 80-1 30 with otherwise normal vitals.    ED course and data review: On arrival, afebrile BP 112/97 with pulse 122 and otherwise normal vitals.  Troponin 245 with BNP 791.9.  WBC and respiratory viral panel negative for COVID flu and RSV.  Hemoglobin at baseline at 11.8.  BMP unremarkable.  EKG, personally viewed and interpreted showing A-fib at 122 with nonspecific ST-T wave changes. Chest x-ray showed mild cardiomegaly and pulmonary vascular congestion.  No focal consolidation or large pleural effusion. Patient was administered his home metoprolol with improvement in heart rate to 99 by admission.  He was also treated with a dose of IV Lasix for possible CHF and started on heparin infusion for possible NSTEMI.    1/7 : Patient had mild uptrend in high-sensitivity troponins however asymptomatic with no chest pain or palpitation/diaphoresis.    1/8- remains hemodynamically stable.  Cardiology performed cath. Identified to have non-obstructive disease with Afib and significant aortic stenosis thought to be contributing factors to his presenting symptoms.  1/9- cardiology following for management of Afib and evaluation for AV replacement  1/10-1/11- stable from cardiac standpoint. PT evaluated and recommended SNF at dc. Awaiting SNF placement. Appears to be suffering from acute grief vs delirium.   1/12-1/13 - BP's low, improving with fluids.  Recurrent A-fib RVR.    Assessment and Plan: NSTEMI-  Patient presents with fatigue and dyspnea on exertion with troponin 245  Has right sided chest pain and with nonacute EKG Cath performed on 1/8 without intervention. Nonobstructive disease.  - cardiology consulted, Dr. Humphrey Rolls - Transitioned from heparin infusion >> Eliquis - Continue metoprolol and atorvastatin. - started Entresto - now on hold due to hypotension  Hypotension- suspect hypovolemic due to poor PO intake and possibly over-diuresis. -250 cc bolus fluids yesterday AM with fairly good response -MAPs have been stable -continue NS @ 75 cc/hr for at least today -500 cc bolus  -maintain MAP > 65 -Hold Entresto and Lasix (orders d/c'd) -Suspect his BP will not tolerate Entresto -Start midodrine  HFrEF  HTN- echo from 12/2 shows EF 40 to 45% with G1 DD. Echo from 1/8 reveals worsening function. EF now 35-40% with moderate decreased function, severe dilation of LV, LA and RA. Moderate to severe aortic stenosis.  Dyspnea on exertion with BNP 791 and pulmonary vascular congestion on chest x-ray at admission. Now appears dry, low BP's, possibly over-diuresed and from [place].  - Continue metoprolol -  Hold Entresto and Lasix - Daily weights with intake and output monitoring - started on SGLT2i ([place]) - PT/OT evaluation- recommending SNF placement.   AKI- Cr baseline around 0.9. Cr rising past couple of days 1.42 >> 1.60>>1.55>>1.43 today Likely due to  over-diuresis and poor PO intake, Entresto. - Hold Lasix and Entresto - Continue gentle IV hydration - BMP am  Leukocytosis - resolved today 1/13.   WBC 14 on 1/12, previously normal.  No fevers. Pt confused and poor historian, unable to elicit acute complaints for s/sx's of infection.   UA negative.  MRSA screen negative.   CXR last night mild vascular congestion without significant edema (no focal consolidation to suggest pneumonia). Started fluids for lower BP's in setting of recurrent A-fib RVR, suspect over-diuresis - Monitor CBC - Stop Vanc/Cefepime started overnight - no s/sx's of infection, hypotension is due to A-fib RVR and low EF - Follow blood culture to completion  Paroxysmal atrial fibrillation with RVR - Rate normal  Heart rate 122 on arrival in the setting of missing medication on the day of arrival Recently hospitalized from 12/31 through 04/20/2022 with rapid A-fib attributed to medication noncompliance, Improved with home dose of metoprolol - Continue metoprolol - Continue PO amiodarone  - Explore barriers to medication compliance. Receives his care at [facility] hospital.   Aortic stenosis- per early cardiology notes, if patient stabilizes from Afib standpoint on amiodarone, will be evaluated for possible aortic valve replacement.  - continue to follow up with cardiology attending.   Type 2 diabetes mellitus with peripheral neuropathy (HCC) Controlled.  Last A1c 6.4 on 12/31 Sliding scale insulin coverage while hospitalized - started SGLT2i  Tobacco use disorder - Nicotine patch discontinued - start chantix instead to avoid cardiac implication with nicotine replacement therapy  Alcohol use disorder, in remission 12 pack beer daily, quit cold [place] weeks prior to admission and has no withdrawals.  Became overly sedated ativan dose given.  - hold ativan unless needed for anxiety/withdrawal but appears to be outside withdrawal window.  - monitor CIWA  Dementia with  acute intermittent delirium  acute grief vs depression - chaplain consult - consider psych consult - continue seroquel QHS - palliative consult  PVD (peripheral vascular disease) (HCC) Continue aspirin and atorvastatin History of amputation digits right foot  History of lung cancer- cta positive for Spiculated mixed solid and cystic nodule within the anterior left upper lobe measuring 18 mm, indeterminate. Will need follow up imaging in 3 months or sooner. Patient receives his oncology care through [place]. Unsure of the significance of the current findings vs what has been present on monitoring images prior to admission due to not having access to [place] chart. Discussed the findings with patient and his sister at bedside and recommended they discuss with oncology at follow up appointment. They endorsed understanding and agreement.  S/p radiation at the [facility] 2019 No acute issues suspected - continue oxygen PRN  Chronic pain Continue methadone and oxycodone  COPD- Not acutely exacerbated Continue home inhalers DuoNebs as needed    Subjective: Patient seen seated in recliner this AM.  Overnight he required in/out cath per RN.  Got a fluid bolus, midodrine for recurrent A-fib RVR in 130's with hypotension.  Started on broad spectrum IV antibiotics, but infection no source of infection and patient denies symptoms.  He was also given a dose of Haldol for some agitation or combativeness.  Today he is calm, denies chest pain, SOB, palpitations, fever/chills, congestion, cough, N/V/D or other complaints.  Not dizzy or lightgheaded.  Physical Exam: Vitals:   05/02/22 0900 05/02/22 1000 05/02/22 1100 05/02/22 1131  BP:    (!) 84/54  Pulse:    76  Resp: 20 19 15 18   Temp:    97.8 F (36.6 C)  TempSrc:      SpO2:    99%  Weight:       General exam: awake, up in recliner, no acute distress, chronically ill-appearing HEENT: moist mucus membranes, hearing grossly normal  Respiratory system: CTAB but  generally diminished throughout, no wheezes, normal respiratory effort. Cardiovascular system: normal S1/S2, RRR, no pedal edema.   Gastrointestinal system: soft, NT, ND, no HSM felt, +bowel sounds. Central nervous system: grossly non-focal exam, normal speech Extremities: moves all, no edema, normal tone Skin: dry, intact, normal temperature, poor skin turgor Psychiatry: Minimally interactive, normal mood, flat affect     Data Reviewed: CBC    Component Value Date/Time   WBC 9.9 05/02/2022 0226   RBC 4.59 05/02/2022 0226   HGB 12.1 (L) 05/02/2022 0226   HCT 37.6 (L) 05/02/2022 0226   PLT 247 05/02/2022 0226   MCV 81.9 05/02/2022 0226   MCH 26.4 05/02/2022 0226   MCHC 32.2 05/02/2022 0226   RDW 16.4 (H) 05/02/2022 0226   LYMPHSABS 2.5 04/19/2022 0105   MONOABS 0.7 04/19/2022 0105   EOSABS 0.2 04/19/2022 0105   BASOSABS 0.0 04/19/2022 0105      Latest Ref Rng & Units 05/02/2022    2:26 AM 05/01/2022   11:26 PM 05/01/2022    4:18 AM  BMP  Glucose 70 - 99 mg/dL 129  123  121   BUN 8 - 23 mg/dL 40  40  35   Creatinine 0.61 - 1.24 mg/dL 1.43  1.55  1.60   Sodium 135 - 145 mmol/L 136  135  137   Potassium 3.5 - 5.1 mmol/L 3.8  4.1  3.6   Chloride 98 - 111 mmol/L 107  104  103   CO2 22 - 32 mmol/L 18  19  21    Calcium 8.9 - 10.3 mg/dL 8.6  8.5  9.0     Family Communication: none at bedside, will attempt to call. Attempted to call sister but got voicemail. Will try again later.  Disposition: Status is: Inpatient  Remains inpatient appropriate because: Hypotension and poor PO intake, requiring IV fluids.   Awaiting SNF placement.   Planned Discharge Destination: SNF  Time spent: 45 minutes  Author: Ezekiel Slocumb, DO 05/02/2022 12:04 PM  For on call review [email or web address].

## 2022-05-02 NOTE — Progress Notes (Signed)
Pharmacy Antibiotic Note  Jason Beck is a 67 y.o. male admitted on 04/25/2022 with sepsis.  Pharmacy has been consulted for Cefepime & Vancomycin dosing.  Plan: Cefepime 2 gm q12hr per indication & renal fxn.  Pt given Vancomycin 1500 mg once. Vancomycin 750 mg IV Q 24 hrs. Goal AUC 400-550. Expected AUC: 419.5 SCr used: 1.55, TBW 63.7 kg < IBW 73 kg  Pharmacy will continue to follow and will adjust abx dosing whenever warranted.  Temp (24hrs), Avg:98 F (36.7 C), Min:97.8 F (36.6 C), Max:98.2 F (36.8 C)   Recent Labs  Lab 04/27/22 0529 04/28/22 0248 04/29/22 0432 04/30/22 0240 05/01/22 0418 05/01/22 2326 05/01/22 2352  WBC 6.2 6.4 7.0 7.9 14.2*  --   --   CREATININE 0.91 0.86 1.13 1.42* 1.60* 1.55*  --   LATICACIDVEN  --   --   --   --   --   --  1.1    Estimated Creatinine Clearance: 41.7 mL/min (A) (by C-G formula based on SCr of 1.55 mg/dL (H)).    Allergies  Allergen Reactions   Niacin Other (See Comments)   Penicillin G     Antimicrobials this admission: 1/13 Cefepime >>  1/13 Vancomycin >>   Microbiology results: 1/12 BCx: Pending  Thank you for allowing pharmacy to be a part of this patient's care.  Renda Rolls, PharmD, [facility] 05/02/2022 12:34 AM

## 2022-05-02 NOTE — Progress Notes (Signed)
2237: RN entered patient's room due to call from telesitter that patient was attempting to get out of bed. Patient complaining of chest pain. EKG completed that showed Afib w/ RVR with rates in the 130s. Patient hypotensive. On-call provider notified. See orders.

## 2022-05-02 NOTE — Progress Notes (Signed)
Mobility Specialist - Progress Note    05/02/22 0800  Mobility  Activity Ambulated with assistance in room;Stood at bedside;Dangled on edge of bed  Level of Assistance Standby assist, set-up cues, supervision of patient - no hands on  Assistive Device Front wheel walker  Distance Ambulated (ft) 4 ft  Activity Response Tolerated well  Mobility Referral Yes  $Mobility charge 1 Mobility   Pt resting in bed and aroused to knock upon entry utilizing RA. Pt agreeable to ambulation. Pt STS and ambulates (4-step turn pivot) to chair SBA with RW. Pt left with needs in reach and bed alarm activated.   Loma Sender Mobility Specialist 05/02/22, 8:42 AM

## 2022-05-03 DIAGNOSIS — I214 Non-ST elevation (NSTEMI) myocardial infarction: Secondary | ICD-10-CM | POA: Diagnosis not present

## 2022-05-03 DIAGNOSIS — I5043 Acute on chronic combined systolic (congestive) and diastolic (congestive) heart failure: Secondary | ICD-10-CM | POA: Diagnosis not present

## 2022-05-03 DIAGNOSIS — I480 Paroxysmal atrial fibrillation: Secondary | ICD-10-CM | POA: Diagnosis not present

## 2022-05-03 LAB — GLUCOSE, CAPILLARY
Glucose-Capillary: 100 mg/dL — ABNORMAL HIGH (ref 70–99)
Glucose-Capillary: 93 mg/dL (ref 70–99)
Glucose-Capillary: 167 mg/dL — ABNORMAL HIGH (ref 70–99)
Glucose-Capillary: 108 mg/dL — ABNORMAL HIGH (ref 70–99)

## 2022-05-03 LAB — BASIC METABOLIC PANEL
Anion gap: 8 (ref 5–15)
BUN: 23 mg/dL (ref 8–23)
CO2: 19 mmol/L — ABNORMAL LOW (ref 22–32)
Calcium: 8.8 mg/dL — ABNORMAL LOW (ref 8.9–10.3)
Chloride: 111 mmol/L (ref 98–111)
Creatinine, Ser: 1.01 mg/dL (ref 0.61–1.24)
GFR, Estimated: >60 mL/min (ref >60)
Glucose, Bld: 99 mg/dL (ref 70–99)
Potassium: 3.5 mmol/L (ref 3.5–5.1)
Sodium: 138 mmol/L (ref 135–145)

## 2022-05-03 LAB — CBC
HCT: 34.5 % — ABNORMAL LOW (ref 39.0–52.0)
Hemoglobin: 10.9 g/dL — ABNORMAL LOW (ref 13.0–17.0)
MCH: 26.3 pg (ref 26.0–34.0)
MCHC: 31.6 g/dL (ref 30.0–36.0)
MCV: 83.3 fL (ref 80.0–100.0)
Platelets: 246 10*3/uL (ref 150–400)
RBC: 4.14 MIL/uL — ABNORMAL LOW (ref 4.22–5.81)
RDW: 16.4 % — ABNORMAL HIGH (ref 11.5–15.5)
WBC: 6.5 10*3/uL (ref 4.0–10.5)
nRBC: 0 % (ref 0.0–0.2)

## 2022-05-03 LAB — PROCALCITONIN: Procalcitonin: <0.1 ng/mL

## 2022-05-03 MED ORDER — POTASSIUM CHLORIDE CRYS ER 20 MEQ PO TBCR
40.0000 meq | EXTENDED_RELEASE_TABLET | Freq: Once | ORAL | Status: AC
  Administered 2022-05-03: 40 meq via ORAL
  Filled 2022-05-03: qty 2

## 2022-05-03 NOTE — Progress Notes (Signed)
Progress Note   Patient: Jason Beck DOB: Apr 02, 1956 DOA: 04/25/2022     8 DOS: the patient was seen and examined on 05/03/2022   Brief hospital course: 67 y.o. 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  He complains of intermittent sharp right-sided chest pain.  Denies lower extremity edema or orthopnea.  He is compliant with his Eliquis.  Has had no cough, fever or chills.  States he felt so poorly he did not take his medications today.  He was brought in by EMS who reported a heart rate of 80-1 30 with otherwise normal vitals.    ED course and data review: On arrival, afebrile BP 112/97 with pulse 122 and otherwise normal vitals.  Troponin 245 with BNP 791.9.  WBC and respiratory viral panel negative for COVID flu and RSV.  Hemoglobin at baseline at 11.8.  BMP unremarkable.  EKG, personally viewed and interpreted showing A-fib at 122 with nonspecific ST-T wave changes. Chest x-ray showed mild cardiomegaly and pulmonary vascular congestion.  No focal consolidation or large pleural effusion. Patient was administered his home metoprolol with improvement in heart rate to 99 by admission.  He was also treated with a dose of IV Lasix for possible CHF and started on heparin infusion for possible NSTEMI.    1/7 : Patient had mild uptrend in high-sensitivity troponins however asymptomatic with no chest pain or palpitation/diaphoresis.    1/8- remains hemodynamically stable.  Cardiology performed cath. Identified to have non-obstructive disease with Afib and significant aortic stenosis thought to be contributing factors to his presenting symptoms.  1/9- cardiology following for management of Afib and evaluation for AV replacement  1/10-1/11- stable from cardiac standpoint. PT evaluated and recommended SNF at dc. Awaiting SNF placement. Appears to be suffering from acute grief vs delirium.   1/12-1/13 - BP's low, improving with fluids.  Recurrent A-fib RVR.    Assessment and Plan: NSTEMI-  Patient presents with fatigue and dyspnea on exertion with troponin 245  Has right sided chest pain and with nonacute EKG Cath performed on 1/8 without intervention. Nonobstructive disease.  - cardiology consulted, Dr. Humphrey Rolls - Cardiology follow up at [facility] recommended - Transitioned from heparin >> Eliquis - Continue metoprolol and atorvastatin. - started Entresto - now on hold due to hypotension  Hypotension- suspect hypovolemic due to poor PO intake and possibly over-diuresis. -given small fluid boluses with fair response, BP seem improved with adequate hydration -MAPs have been stable -monitor off IV fluids, encourage PO hydration -maintain MAP > 65 -Hold Entresto and Lasix (orders d/c'd) -Suspect his BP will not tolerate Entresto -Started midodrine 1/13, had to increase to 10 mg TID  HFrEF  HTN- echo from 12/2 shows EF 40 to 45% with G1 DD. Echo from 1/8 reveals worsening function. EF now 35-40% with moderate decreased function, severe dilation of LV, LA and RA. Moderate to severe aortic stenosis.  Dyspnea on exertion with BNP 791 and pulmonary vascular congestion on chest x-ray at admission. Now appears dry, low  BP's, possibly over-diuresed and from [place].  - Continue metoprolol - Hold Entresto and Lasix - Daily weights with intake and output monitoring - started on SGLT2i ([place]) - PT/OT evaluation- recommending SNF placement.   AKI- Cr baseline around 0.9.  Cr rising past couple of days 1.42 >> 1.60>>1.55>>1.43 today Likely due to over-diuresis and poor PO intake, Entresto. - Hold Lasix and Entresto - Continue gentle IV hydration - BMP am  Leukocytosis - resolved today 1/13.   WBC 14 on 1/12, previously normal.  No fevers. Pt confused and poor historian, unable to elicit acute complaints for s/sx's of infection.   UA negative.  MRSA screen negative.   CXR last night mild vascular congestion without significant edema (no focal consolidation to suggest pneumonia). Started fluids for lower BP's in setting of recurrent A-fib RVR, suspect over-diuresis - Monitor CBC - Stop Vanc/Cefepime started overnight - no s/sx's of infection, hypotension is due to A-fib RVR and low EF - Follow blood culture to completion  Paroxysmal atrial fibrillation with RVR - Rate normal  Heart rate 122 on arrival in the setting of missing medication on the day of arrival Recently hospitalized from 12/31 through 04/20/2022 with rapid A-fib attributed to medication noncompliance, Improved with home dose of metoprolol - Continue metoprolol - Continue PO amiodarone  - Explore barriers to medication compliance. Receives his care at [facility] hospital.   Aortic stenosis- per early cardiology notes, if patient stabilizes from Afib standpoint on amiodarone, will be evaluated for possible aortic valve replacement.  - continue to follow up with cardiology attending.   Type 2 diabetes mellitus with peripheral neuropathy (HCC) Controlled.  Last A1c 6.4 on 12/31 Sliding scale insulin coverage while hospitalized - started SGLT2i  Tobacco use disorder - Nicotine patch discontinued - start chantix instead to avoid cardiac implication with nicotine replacement therapy  Alcohol use disorder, in remission 12 pack beer daily, quit cold [place] weeks prior to admission and has no withdrawals.  Became overly sedated ativan dose given.  - hold ativan unless needed for anxiety/withdrawal but  appears to be outside withdrawal window.  - monitor CIWA  Dementia with acute intermittent delirium  acute grief vs depression - chaplain consult - consider psych consult - continue seroquel QHS - palliative consult  PVD (peripheral vascular disease) (HCC) Continue aspirin and atorvastatin History of amputation digits right foot  History of lung cancer- cta positive for Spiculated mixed solid and cystic nodule within the anterior left upper lobe measuring 18 mm, indeterminate. Will need follow up imaging in 3 months or sooner. Patient receives his oncology care through [place]. Unsure of the significance of the current findings vs what has been present on monitoring images prior to admission due to not having access to [place] chart. Discussed the findings with patient and his sister at bedside and recommended they discuss with oncology at follow up appointment. They endorsed understanding and agreement.  S/p radiation at the [facility] 2019 No acute issues suspected - continue oxygen PRN  Chronic pain Continue methadone and oxycodone  COPD- Not acutely exacerbated Continue home inhalers DuoNebs as needed    Subjective: Patient seen with wife at bedside this AM.  He denies acute complaints including dizziness or lightheadedness.  He is more alert and interactive today.    Physical Exam: Vitals:   05/03/22 1000 05/03/22 1100 05/03/22 1144 05/03/22 1200  BP:   102/69   Pulse:   87   Resp: 15 17 18 17   Temp:   97.8 F (36.6  C)   TempSrc:   Oral   SpO2:   99%   Weight:       General exam: awake, no acute distress, chronically ill-appearing HEENT: moist mucus membranes, hearing grossly normal  Respiratory system: CTAB generally diminished, no wheezes, normal respiratory effort. Cardiovascular system: normal S1/S2, RRR, no pedal edema.   Gastrointestinal system: soft, NT, ND, no HSM felt, +bowel sounds. Central nervous system: A&Ox3, grossly non-focal exam, normal speech Extremities: moves  all, no edema, normal tone Skin: dry flaky skin on forehead, intact, normal temperature Psychiatry: more interactive, normal mood, flat affect     Data Reviewed: CBC    Component Value Date/Time   WBC 6.5 05/03/2022 0410   RBC 4.14 (L) 05/03/2022 0410   HGB 10.9 (L) 05/03/2022 0410   HCT 34.5 (L) 05/03/2022 0410   PLT 246 05/03/2022 0410   MCV 83.3 05/03/2022 0410   MCH 26.3 05/03/2022 0410   MCHC 31.6 05/03/2022 0410   RDW 16.4 (H) 05/03/2022 0410   LYMPHSABS 2.5 04/19/2022 0105   MONOABS 0.7 04/19/2022 0105   EOSABS 0.2 04/19/2022 0105   BASOSABS 0.0 04/19/2022 0105      Latest Ref Rng & Units 05/03/2022    4:10 AM 05/02/2022    2:26 AM 05/01/2022   11:26 PM  BMP  Glucose 70 - 99 mg/dL 99  129  123   BUN 8 - 23 mg/dL 23  40  40   Creatinine 0.61 - 1.24 mg/dL 1.01  1.43  1.55   Sodium 135 - 145 mmol/L 138  136  135   Potassium 3.5 - 5.1 mmol/L 3.5  3.8  4.1   Chloride 98 - 111 mmol/L 111  107  104   CO2 22 - 32 mmol/L 19  18  19    Calcium 8.9 - 10.3 mg/dL 8.8  8.6  8.5     Family Communication: wife at bedside on rounds today 1/14  Disposition: Status is: Inpatient  Remains inpatient appropriate because: Ongoing hypotension and poor PO intake, requiring close monitoring and medication changes.  Needs SNF placement.   Planned Discharge Destination: SNF  Time spent: 40 minutes  Author: Ezekiel Slocumb, DO 05/03/2022 12:27 PM  For on call review [email or web address].

## 2022-05-03 NOTE — Plan of Care (Signed)
  Problem: Education: Goal: Ability to describe self-care measures that may prevent or decrease complications (Diabetes Survival Skills Education) will improve Outcome: Progressing Goal: Individualized Educational Video(s) Outcome: Progressing   Problem: Coping: Goal: Ability to adjust to condition or change in health will improve Outcome: Progressing   Problem: Fluid Volume: Goal: Ability to maintain a balanced intake and output will improve Outcome: Progressing   Problem: Health Behavior/Discharge Planning: Goal: Ability to identify and utilize available resources and services will improve Outcome: Progressing Goal: Ability to manage health-related needs will improve Outcome: Progressing   Problem: Metabolic: Goal: Ability to maintain appropriate glucose levels will improve Outcome: Progressing   Problem: Nutritional: Goal: Maintenance of adequate nutrition will improve Outcome: Progressing Goal: Progress toward achieving an optimal weight will improve Outcome: Progressing   Problem: Skin Integrity: Goal: Risk for impaired skin integrity will decrease Outcome: Progressing   Problem: Tissue Perfusion: Goal: Adequacy of tissue perfusion will improve Outcome: Progressing   Problem: Education: Goal: Understanding of cardiac disease, CV risk reduction, and recovery process will improve Outcome: Progressing Goal: Individualized Educational Video(s) Outcome: Progressing   Problem: Activity: Goal: Ability to tolerate increased activity will improve Outcome: Progressing   Problem: Cardiac: Goal: Ability to achieve and maintain adequate cardiovascular perfusion will improve Outcome: Progressing

## 2022-05-04 DIAGNOSIS — I214 Non-ST elevation (NSTEMI) myocardial infarction: Secondary | ICD-10-CM | POA: Diagnosis not present

## 2022-05-04 LAB — BASIC METABOLIC PANEL
Anion gap: 7 (ref 5–15)
BUN: 18 mg/dL (ref 8–23)
CO2: 21 mmol/L — ABNORMAL LOW (ref 22–32)
Calcium: 9.1 mg/dL (ref 8.9–10.3)
Chloride: 110 mmol/L (ref 98–111)
Creatinine, Ser: 0.97 mg/dL (ref 0.61–1.24)
GFR, Estimated: >60 mL/min (ref >60)
Glucose, Bld: 87 mg/dL (ref 70–99)
Potassium: 4.1 mmol/L (ref 3.5–5.1)
Sodium: 138 mmol/L (ref 135–145)

## 2022-05-04 LAB — CBC
HCT: 36.3 % — ABNORMAL LOW (ref 39.0–52.0)
Hemoglobin: 11.2 g/dL — ABNORMAL LOW (ref 13.0–17.0)
MCH: 25.9 pg — ABNORMAL LOW (ref 26.0–34.0)
MCHC: 30.9 g/dL (ref 30.0–36.0)
MCV: 83.8 fL (ref 80.0–100.0)
Platelets: 260 10*3/uL (ref 150–400)
RBC: 4.33 MIL/uL (ref 4.22–5.81)
RDW: 16.6 % — ABNORMAL HIGH (ref 11.5–15.5)
WBC: 6 10*3/uL (ref 4.0–10.5)
nRBC: 0 % (ref 0.0–0.2)

## 2022-05-04 LAB — GLUCOSE, CAPILLARY
Glucose-Capillary: 108 mg/dL — ABNORMAL HIGH (ref 70–99)
Glucose-Capillary: 149 mg/dL — ABNORMAL HIGH (ref 70–99)
Glucose-Capillary: 104 mg/dL — ABNORMAL HIGH (ref 70–99)
Glucose-Capillary: 106 mg/dL — ABNORMAL HIGH (ref 70–99)

## 2022-05-04 LAB — MAGNESIUM: Magnesium: 2.3 mg/dL (ref 1.7–2.4)

## 2022-05-04 MED ORDER — LOSARTAN POTASSIUM 25 MG PO TABS
12.5000 mg | ORAL_TABLET | Freq: Every day | ORAL | Status: DC
  Administered 2022-05-04 – 2022-05-05 (×2): 12.5 mg via ORAL
  Filled 2022-05-04 (×2): qty 1

## 2022-05-04 MED ORDER — MAGNESIUM HYDROXIDE 400 MG/5ML PO SUSP
5.0000 mL | Freq: Every day | ORAL | Status: DC | PRN
  Administered 2022-05-04: 5 mL via ORAL
  Filled 2022-05-04: qty 30

## 2022-05-04 MED ORDER — LOPERAMIDE HCL 2 MG PO CAPS: 2.0000 mg | ORAL_CAPSULE | ORAL | Status: DC | PRN

## 2022-05-04 NOTE — Progress Notes (Signed)
   05/04/22 0011  Assess: MEWS Score  Temp 97.7 F (36.5 C)  BP (!) 75/52  MAP (mmHg) (!) 62  Pulse Rate 70  Resp 16  SpO2 100 %  O2 Device Room Air  Patient Activity (if Appropriate) In bed  Assess: MEWS Score  MEWS Temp 0  MEWS Systolic 2  MEWS Pulse 0  MEWS RR 0  MEWS LOC 0  MEWS Score 2  MEWS Score Color Yellow  Assess: if the MEWS score is Yellow or Red  Were vital signs taken at a resting state? Yes  Focused Assessment No change from prior assessment  Does the patient meet 2 or more of the SIRS criteria? No  MEWS guidelines implemented *See Row Information* No, vital signs rechecked  Treat  MEWS Interventions Administered scheduled meds/treatments  Pain Scale 0-10  Pain Score 0  Notify: Charge Nurse/RN  Name of Charge Nurse/RN Notified Alex, RN  Date Charge Nurse/RN Notified 05/04/22  Time Charge Nurse/RN Notified 0100  Document  Patient Outcome Stabilized after interventions  Progress note created (see row info) Yes  Assess: SIRS CRITERIA  SIRS Temperature  0  SIRS Pulse 0  SIRS Respirations  0  SIRS WBC 0  SIRS Score Sum  0

## 2022-05-04 NOTE — TOC Progression Note (Signed)
Transition of Care [facility]) - Progression Note    Patient Details  Name: Jason Beck MRN: [id number] Date of Birth: 1955/06/28  Transition of [facility]) CM/SW Contact  Laurena Slimmer, RN Phone Number: 05/04/2022, 4:45 PM  Clinical Narrative:    Spoke with patient at bedside regarding new rec for outpatient referral. Patient prefers [facility] main at discharge.    Expected Discharge Plan: Home/Self Care Barriers to Discharge: Continued Medical Work up  Expected Discharge Plan and Services       Living arrangements for the past 2 months: Single Family Home                                       Social Determinants of Health (SDOH) Interventions SDOH Screenings   Food Insecurity: No Food Insecurity (04/27/2022)  Housing: Low Risk  (04/27/2022)  Transportation Needs: No Transportation Needs (04/27/2022)  Utilities: Not At Risk (04/27/2022)  Tobacco Use: High Risk (04/28/2022)    Readmission Risk Interventions    04/28/2022   11:30 AM  Readmission Risk Prevention Plan  Transportation Screening Complete  PCP or Specialist Appt within 3-5 Days Complete  HRI or [place] Patient refused  Social Work Consult for [place] Planning/Counseling Complete  Palliative Care Screening Not Applicable  Medication Review Press photographer) Complete

## 2022-05-04 NOTE — Progress Notes (Signed)
SUBJECTIVE: Patient is a 67 year old Caucasian male with medical history significant for asthma, atrial fibrillation, CHF, COPD, type II diabetes mellitus, hypertension and depression, who presented to the ED on 04/25/22 complaining of worsening shortness of breath, intermittent right-sided chest pain and generalized weakness over the previous 3 days.    Patient found to be in atrial fibrillation with RVR. Admitted to the hospital for NSTEMI and CHF.    Cardiac cath on 18/24 revealed distal RCA lesion 70%, 1st marg lesion 55%, mid LAD 50%. Severe aortic stenosis. Recommend medical therapy for coronary artery disease. No revascularization is indicated.    Vitals:   05/03/22 1936 05/04/22 0011 05/04/22 0125 05/04/22 0445  BP: 114/67 (!) 75/52 103/73 120/74  Pulse: 64 70 67 74  Resp: 20 16  20   Temp: 97.6 F (36.4 C) 97.7 F (36.5 C)  97.6 F (36.4 C)  TempSrc: Oral Oral  Oral  SpO2: 100% 100% 97% 97%  Weight:        Intake/Output Summary (Last 24 hours) at 05/04/2022 0850 Last data filed at 05/04/2022 0450 Gross per 24 hour  Intake 710 ml  Output 325 ml  Net 385 ml    LABS: Basic Metabolic Panel: Recent Labs    05/02/22 0226 05/03/22 0410 05/04/22 0415  NA 136 138 138  K 3.8 3.5 4.1  CL 107 111 110  CO2 18* 19* 21*  GLUCOSE 129* 99 87  BUN 40* 23 18  CREATININE 1.43* 1.01 0.97  CALCIUM 8.6* 8.8* 9.1  MG 1.8  --  2.3  PHOS 3.9  --   --    Liver Function Tests: Recent Labs    05/01/22 2326  AST 23  ALT 16  ALKPHOS 56  BILITOT 2.2*  PROT 7.1  ALBUMIN 3.5   No results for input(s): "LIPASE", "AMYLASE" in the last 72 hours. CBC: Recent Labs    05/03/22 0410 05/04/22 0415  WBC 6.5 6.0  HGB 10.9* 11.2*  HCT 34.5* 36.3*  MCV 83.3 83.8  PLT 246 260   Cardiac Enzymes: No results for input(s): "CKTOTAL", "CKMB", "CKMBINDEX", "TROPONINI" in the last 72 hours. BNP: Invalid input(s): "POCBNP" D-Dimer: No results for input(s): "DDIMER" in the last 72  hours. Hemoglobin A1C: No results for input(s): "HGBA1C" in the last 72 hours. Fasting Lipid Panel: No results for input(s): "CHOL", "HDL", "LDLCALC", "TRIG", "CHOLHDL", "LDLDIRECT" in the last 72 hours. Thyroid Function Tests: No results for input(s): "TSH", "T4TOTAL", "T3FREE", "THYROIDAB" in the last 72 hours.  Invalid input(s): "FREET3" Anemia Panel: No results for input(s): "VITAMINB12", "FOLATE", "FERRITIN", "TIBC", "IRON", "RETICCTPCT" in the last 72 hours.   PHYSICAL EXAM General: Well developed, well nourished, in no acute distress HEENT:  Normocephalic and atramatic Neck:  No JVD.  Lungs: Clear bilaterally to auscultation and percussion. Heart: HRRR . Normal S1 and S2 without gallops or murmurs.  Abdomen: Bowel sounds are positive, abdomen soft and non-tender  Msk:  Back normal, normal gait. Normal strength and tone for age. Extremities: No clubbing, cyanosis or edema.   Neuro: Alert and oriented X 3. Psych:  Good affect, responds appropriately  TELEMETRY: atrial fibrillation, HR 87 bpm  ASSESSMENT AND PLAN: Patient doing well. Patient much more alert and able to converse now. Patient denies chest pain, shortness of breath. Continue to be in atrial fibrillation on oral amiodarone, rate controlled. Patient hypotensive, Entresto held. Patient can be discharged from a cardiac standpoint. Follow up in office on Friday 05/08/22 at 10:00 am.   Principal  Problem:   NSTEMI (non-ST elevated myocardial infarction) ([place]) Active Problems:   Type 2 diabetes mellitus with peripheral neuropathy (HCC)   Chronic obstructive pulmonary disease (COPD) (HCC)   Paroxysmal atrial fibrillation with RVR (HCC)   Acute on chronic combined systolic and diastolic CHF (congestive heart failure) (HCC)   Essential hypertension   Chronic pain   History of lung cancer   PVD (peripheral vascular disease) (HCC)   Alcohol use disorder, in remission   Tobacco use disorder   Spiculated nodule of upper  lobe of left lung CT 04/25/21   Acute pulmonary edema (HCC)   Chronic atrial fibrillation (HCC)   Aortic valve stenosis    [place], FNP-C 05/04/2022 8:50 AM

## 2022-05-04 NOTE — Progress Notes (Signed)
Physical Therapy Treatment Patient Details Name: Jason Beck MRN: [id number] DOB: 13-Dec-1955 Today's Date: 05/04/2022   History of Present Illness Pt is a 67 y.o. man with medical history significant for COPD, chronic combined CHF (EF 40 to 45%)  DM, HTN, PVD, tobacco use disorder, EtOH abuse in remission x 1 week, lung cancer s/p radiation, and A-fib who returns to the ED after recent prior admission with a several day history of fatigue and dyspnea on exertion with HR 80-1 30 with otherwise normal vitals.  MD assessment includes: NSTEMI, hypotension, AKI, leukocytosis, and dementia with acute intermittent delirium.    PT Comments    Pt was pleasant and motivated to participate during the session and put forth good effort throughout. Pt made good progress towards goals including amb without an AD 250+ feet with only min instability.  Pt also amb 150 feet with a RW with noted improvement in stability with pt educated to utilize RW at home until cleared by PT.  Pt steady ascending and descending steps with single UE support.  No adverse symptoms noted during the session with SpO2 and HR WNL on room air.  Pt will benefit from OPPT upon discharge to safely address deficits listed in patient problem list for decreased caregiver assistance and eventual return to PLOF.      Recommendations for follow up therapy are one component of a multi-disciplinary discharge planning process, led by the attending physician.  Recommendations may be updated based on patient status, additional functional criteria and insurance authorization.  Follow Up Recommendations  Outpatient PT Can patient physically be transported by private vehicle: Yes   Assistance Recommended at Discharge Intermittent Supervision/Assistance  Patient can return home with the following A little help with walking and/or transfers;Help with stairs or ramp for entrance;Assist for transportation;Assistance with cooking/housework   Equipment  Recommendations  None recommended by PT    Recommendations for Other Services       Precautions / Restrictions Precautions Precautions: Fall Restrictions Weight Bearing Restrictions: No     Mobility  Bed Mobility               General bed mobility comments: NT, pt in recliner    Transfers Overall transfer level: Needs assistance Equipment used: Rolling walker (2 wheels), None Transfers: Sit to/from Stand Sit to Stand: Supervision           General transfer comment: Good control and stability    Ambulation/Gait Ambulation/Gait assistance: Supervision Gait Distance (Feet): 250 Feet x 1 without AD, 150 x 1 with RW Assistive device: Rolling walker (2 wheels), None Gait Pattern/deviations: Step-through pattern, Decreased step length - right, Decreased step length - left, Drifts right/left Gait velocity: decreased     General Gait Details: Pt able to amb 1 x 150 feet with a RW and then 1 x 250 feet without an AD; mild drifting left/right and one instance of catching L toe during swing through during amb without AD but pt able to self-correct without physical assist   Stairs Stairs: Yes Stairs assistance: Supervision Stair Management: One rail Right, Forwards, Alternating pattern Number of Stairs: 5 General stair comments: Fair to good eccentric and concentric control and stability during stair training with single UE support and alternating pattern   Wheelchair Mobility    Modified Rankin (Stroke Patients Only)       Balance Overall balance assessment: Needs assistance Sitting-balance support: Feet supported Sitting balance-Leahy Scale: Good     Standing balance support: During functional activity, No  upper extremity supported Standing balance-Leahy Scale: Good                              Cognition Arousal/Alertness: Awake/alert Behavior During Therapy: WFL for tasks assessed/performed Overall Cognitive Status: Within Functional  Limits for tasks assessed                                          Exercises Total Joint Exercises Ankle Circles/Pumps: AROM, Strengthening, Both, 10 reps Quad Sets: Strengthening, Both, 10 reps Gluteal Sets: Strengthening, Both, 10 reps Long Arc Quad: Strengthening, Both, 10 reps Knee Flexion: Strengthening, Both, 10 reps Other Exercises Other Exercises: HEP education for BLE APs, QS, GS, and LAQs x 10 each every 1-2 hours daily    General Comments        Pertinent Vitals/Pain Pain Assessment Pain Assessment: No/denies pain    Home Living                          Prior Function            PT Goals (current goals can now be found in the care plan section) Progress towards PT goals: Progressing toward goals    Frequency    Min 2X/week      PT Plan Discharge plan needs to be updated    Co-evaluation              AM-PAC PT "6 Clicks" Mobility   Outcome Measure  Help needed turning from your back to your side while in a flat bed without using bedrails?: None Help needed moving from lying on your back to sitting on the side of a flat bed without using bedrails?: None Help needed moving to and from a bed to a chair (including a wheelchair)?: A Little Help needed standing up from a chair using your arms (e.g., wheelchair or bedside chair)?: A Little Help needed to walk in hospital room?: A Little Help needed climbing 3-5 steps with a railing? : A Little 6 Click Score: 20    End of Session Equipment Utilized During Treatment: Gait belt Activity Tolerance: Patient tolerated treatment well Patient left: in chair;with call bell/phone within reach;with chair alarm set Nurse Communication: Mobility status PT Visit Diagnosis: Unsteadiness on feet (R26.81);Other abnormalities of gait and mobility (R26.89);Muscle weakness (generalized) (M62.81);Difficulty in walking, not elsewhere classified (R26.2)     Time: 5993-5701 PT Time  Calculation (min) (ACUTE ONLY): 24 min  Charges:  $Gait Training: 23-37 mins                    D. Scott Shaunice Levitan PT, DPT 05/04/22, 12:17 PM

## 2022-05-04 NOTE — Progress Notes (Signed)
Progress Note   Patient: Jason Beck POE:[id number] DOB: 1955/11/30 DOA: 04/25/2022     8 DOS: the patient was seen and examined on 05/03/2022   Brief hospital course: 67 y.o. 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  He complains of intermittent sharp right-sided chest pain.  Denies lower extremity edema or orthopnea.  He is compliant with his Eliquis.  Has had no cough, fever or chills.  States he felt so poorly he did not take his medications today.  He was brought in by EMS who reported a heart rate of 80-1 30 with otherwise normal vitals.    ED course and data review: On arrival, afebrile BP 112/97 with pulse 122 and otherwise normal vitals.  Troponin 245 with BNP 791.9.  WBC and respiratory viral panel negative for COVID flu and RSV.  Hemoglobin at baseline at 11.8.  BMP unremarkable.  EKG, personally viewed and interpreted showing A-fib at 122 with nonspecific ST-T wave changes. Chest x-ray showed mild cardiomegaly and pulmonary vascular congestion.  No focal consolidation or large pleural effusion. Patient was administered his home metoprolol with improvement in heart rate to 99 by admission.  He was also treated with a dose of IV Lasix for possible CHF and started on heparin infusion for possible NSTEMI.    1/7 : Patient had mild uptrend in high-sensitivity troponins however asymptomatic with no chest pain or palpitation/diaphoresis.    1/8- remains hemodynamically stable.  Cardiology performed cath. Identified to have non-obstructive disease with Afib and significant aortic stenosis thought to be contributing factors to his presenting symptoms.  1/9- cardiology following for management of Afib and evaluation for AV replacement  1/10-1/11- stable from cardiac standpoint. PT evaluated and recommended SNF at dc. Awaiting SNF placement. Appears to be suffering from acute grief vs delirium.   1/12-1/13 - BP's low, improving with fluids.  Recurrent A-fib RVR.    Assessment and Plan: NSTEMI-  Patient presents with fatigue and dyspnea on exertion with troponin 245  Has right sided chest pain and with nonacute EKG Cath performed on 1/8 without intervention. Nonobstructive disease.  - cardiology consulted, Dr. Humphrey Rolls - Cardiology follow up at [facility] recommended - Transitioned from heparin >> Eliquis - Continue metoprolol and atorvastatin. - started Entresto - stopped due to severe hypotension  Hypotension- suspect hypovolemic due to poor PO intake and possibly over-diuresis. -given small fluid boluses with fair response, BP seem improved with adequate hydration BP's improved on max dose midodrine, still soft  -maintain MAP > 65 -Hold Entresto and Lasix (orders d/c'd) -Suspect his BP will not tolerate Entresto -Started midodrine 1/13, had to increase to 10 mg TID -Monitor closely while attempting to resume GDMT for HFrEF  HFrEF  HTN- echo from 12/2 shows EF 40 to 45% with G1 DD. Echo from 1/8 reveals worsening function. EF now 35-40% with moderate decreased function, severe dilation of LV, LA and RA. Moderate to severe aortic stenosis.  Dyspnea on exertion with BNP 791 and pulmonary vascular congestion on chest x-ray  at admission. Now appears dry, low BP's, possibly over-diuresed and from [place].  - Continue metoprolol - Holding Entresto and Lasix w hypotension - Start losartan very low dose 12.5 mg daily.  Will stop Entresto, BP will not tolerate it - Daily  weights with intake and output monitoring - started on SGLT2i ([place]) - PT/OT evaluation- recommending SNF placement.   AKI- Cr baseline around 0.9.  Cr trend 1.42 >> 1.60>>1.55>>1.43 >>1.01>>0.97 today Likely due to over-diuresis and poor PO intake, Entresto. - Hold Lasix and Entresto -- has been hypotensive, unable to resume despite normalized renal function - Off IV fluids after over-diuresis - BMP am  Leukocytosis - resolved today 1/13.   WBC 14 on 1/12, previously normal.  No fevers. Pt confused and poor historian, unable to elicit acute complaints for s/sx's of infection.   UA negative.  MRSA screen negative.   CXR 1/12 overnight showed mild vascular congestion without significant edema (no focal consolidation to suggest pneumonia).  Started fluids for lower BP's in setting of recurrent A-fib RVR, suspect over-diuresis - Monitor CBC - Stop Vanc/Cefepime started overnight - no s/sx's of infection, hypotension is due to A-fib RVR and low EF - Follow blood culture to completion  Paroxysmal atrial fibrillation with RVR - Rate normal  Heart rate 122 on arrival in the setting of missing medication on the day of arrival Recently hospitalized from 12/31 through 04/20/2022 with rapid A-fib attributed to medication noncompliance, Improved with home dose of metoprolol - Continue metoprolol - Continue PO amiodarone  - Explore barriers to medication compliance. Receives his care at [facility] hospital.   Aortic stenosis- per early cardiology notes, if patient stabilizes from Afib standpoint on amiodarone, will be evaluated for possible aortic valve replacement.  - continue to follow up with cardiology attending.   Type 2 diabetes mellitus with peripheral neuropathy (HCC) Controlled.  Last A1c 6.4 on 12/31 Sliding scale insulin coverage while hospitalized - started SGLT2i  Tobacco use disorder - Nicotine patch discontinued - start chantix instead to avoid cardiac implication with nicotine  replacement therapy  Alcohol use disorder, in remission 12 pack beer daily, quit cold [place] weeks prior to admission and has no withdrawals.  Became overly sedated ativan dose given.  - hold ativan unless needed for anxiety/withdrawal but appears to be outside withdrawal window.  - off CIWA, outside w/d timeframe and no symptoms  Dementia with acute intermittent delirium  acute grief vs depression - chaplain consult - consider psych consult - continue seroquel QHS - palliative consult  PVD (peripheral vascular disease) (HCC) Continue aspirin and atorvastatin History of amputation digits right foot  History of lung cancer- cta positive for Spiculated mixed solid and cystic nodule within the anterior left upper lobe measuring 18 mm, indeterminate. Will need follow up imaging in 3 months or sooner. Patient receives his oncology care through [place]. Unsure of the significance of the current findings vs what has been present on monitoring images prior to admission due to not having access to [place] chart. Discussed the findings with patient and his sister at bedside and recommended they discuss with oncology at follow up appointment. They endorsed understanding and agreement.  S/p radiation at the [facility] No acute issues suspected - continue oxygen PRN  Chronic pain Continue methadone and oxycodone  COPD- Not acutely exacerbated Continue home inhalers DuoNebs as needed    Subjective: Patient seen up in recliner this AM.  He reports feeling fine and denies acute complaints other than wanting to go home.  He would prefer not to go to rehab, hopes he does better with therapy today than he did initially.    Physical Exam: Vitals:   05/03/22 1000 05/03/22 1100 05/03/22 1144 05/03/22 1200  BP:   102/69   Pulse:   87   Resp: 15 17 18 17   Temp:   97.8 F (36.6 C)   TempSrc:   Oral   SpO2:   99%   Weight:       General exam: awake, no acute distress, chronically ill-appearing HEENT:  moist mucus membranes, hearing grossly normal  Respiratory system: CTAB generally diminished, no wheezes, normal respiratory effort. Cardiovascular system: normal S1/S2, RRR, no pedal edema.   Gastrointestinal system: soft, NT, ND, no HSM felt, +bowel sounds. Central nervous system: A&Ox3, grossly non-focal exam, normal speech Extremities: moves all, no edema, normal tone Skin: dry flaky skin on forehead, intact, normal temperature Psychiatry: more interactive, normal mood, flat affect     Data Reviewed: CBC    Component Value Date/Time   WBC 6.5 05/03/2022 0410   RBC 4.14 (L) 05/03/2022 0410   HGB 10.9 (L) 05/03/2022 0410   HCT 34.5 (L) 05/03/2022 0410   PLT 246 05/03/2022 0410   MCV 83.3 05/03/2022 0410   MCH 26.3 05/03/2022 0410   MCHC 31.6 05/03/2022 0410   RDW 16.4 (H) 05/03/2022 0410   LYMPHSABS 2.5 04/19/2022 0105   MONOABS 0.7 04/19/2022 0105   EOSABS 0.2 04/19/2022 0105   BASOSABS 0.0 04/19/2022 0105      Latest Ref Rng & Units 05/03/2022    4:10 AM 05/02/2022    2:26 AM 05/01/2022   11:26 PM  BMP  Glucose 70 - 99 mg/dL 99  129  123   BUN 8 - 23 mg/dL 23  40  40   Creatinine 0.61 - 1.24 mg/dL 1.01  1.43  1.55   Sodium 135 - 145 mmol/L 138  136  135   Potassium 3.5 - 5.1 mmol/L 3.5  3.8  4.1   Chloride 98 - 111 mmol/L 111  107  104   CO2 22 - 32 mmol/L 19  18  19    Calcium 8.9 - 10.3 mg/dL 8.8  8.6  8.5     Family Communication: wife at bedside on rounds 1/14  Disposition: Status is: Inpatient  Remains inpatient appropriate because: Monitor BP closely due to hypotension requiring close monitoring and medication changes.  Needs SNF placement.   Planned Discharge Destination: SNF  Time spent: 35 minutes  Author: Ezekiel Slocumb, DO 05/03/2022 12:27 PM  For on call review [email or web address].

## 2022-05-05 ENCOUNTER — Other Ambulatory Visit: Payer: Self-pay | Admitting: Internal Medicine

## 2022-05-05 DIAGNOSIS — I214 Non-ST elevation (NSTEMI) myocardial infarction: Secondary | ICD-10-CM | POA: Diagnosis not present

## 2022-05-05 LAB — BASIC METABOLIC PANEL
Anion gap: 7 (ref 5–15)
BUN: 16 mg/dL (ref 8–23)
CO2: 22 mmol/L (ref 22–32)
Calcium: 9.3 mg/dL (ref 8.9–10.3)
Chloride: 107 mmol/L (ref 98–111)
Creatinine, Ser: 1.02 mg/dL (ref 0.61–1.24)
GFR, Estimated: >60 mL/min (ref >60)
Glucose, Bld: 99 mg/dL (ref 70–99)
Potassium: 3.8 mmol/L (ref 3.5–5.1)
Sodium: 136 mmol/L (ref 135–145)

## 2022-05-05 LAB — GLUCOSE, CAPILLARY: Glucose-Capillary: 100 mg/dL — ABNORMAL HIGH (ref 70–99)

## 2022-05-05 MED ORDER — LOSARTAN POTASSIUM 25 MG PO TABS: 12.5000 mg | ORAL_TABLET | Freq: Every day | ORAL | 1 refills | Status: DC

## 2022-05-05 MED ORDER — AMIODARONE HCL 200 MG PO TABS: 200.0000 mg | ORAL_TABLET | Freq: Every day | ORAL | 2 refills | Status: DC

## 2022-05-05 MED ORDER — EMPAGLIFLOZIN 10 MG PO TABS: 10.0000 mg | ORAL_TABLET | Freq: Every day | ORAL | 2 refills | Status: DC

## 2022-05-05 MED ORDER — METHADONE HCL 10 MG PO TABS
10.0000 mg | ORAL_TABLET | Freq: Two times a day (BID) | ORAL | Status: DC
  Administered 2022-05-05: 10 mg via ORAL
  Filled 2022-05-05: qty 1

## 2022-05-05 MED ORDER — NITROGLYCERIN 0.4 MG SL SUBL: 0.4000 mg | SUBLINGUAL_TABLET | SUBLINGUAL | 0 refills | Status: AC | PRN

## 2022-05-05 MED ORDER — FUROSEMIDE 20 MG PO TABS: 20.0000 mg | ORAL_TABLET | Freq: Every day | ORAL | 1 refills | Status: DC | PRN

## 2022-05-05 MED ORDER — METOPROLOL SUCCINATE ER 50 MG PO TB24: 50.0000 mg | ORAL_TABLET | Freq: Every day | ORAL | 3 refills | Status: DC

## 2022-05-05 MED ORDER — MIDODRINE HCL 10 MG PO TABS: 10.0000 mg | ORAL_TABLET | Freq: Three times a day (TID) | ORAL | 1 refills | Status: AC

## 2022-05-05 MED ORDER — AMIODARONE HCL 200 MG PO TABS: 200.0000 mg | ORAL_TABLET | Freq: Every day | ORAL | Status: DC

## 2022-05-05 MED ORDER — VITAMIN B-1 100 MG PO TABS: 100.0000 mg | ORAL_TABLET | Freq: Every day | ORAL | 3 refills | Status: AC

## 2022-05-05 MED ORDER — MIDODRINE HCL 10 MG PO TABS: 10.0000 mg | ORAL_TABLET | Freq: Three times a day (TID) | ORAL | 1 refills | Status: DC

## 2022-05-05 MED ORDER — GABAPENTIN 300 MG PO CAPS: 300.0000 mg | ORAL_CAPSULE | Freq: Three times a day (TID) | ORAL | 0 refills | Status: DC | PRN

## 2022-05-05 NOTE — Discharge Summary (Signed)
Physician Discharge Summary   Patient: Jason Beck MRN: [id number] DOB: 06/28/1955  Admit date:     04/25/2022  Discharge date: 05/13/22  Discharge Physician: Pennie BanterKelly A Luisangel Wainright   [facility]   Recommendations at discharge:    Follow up with Cardiology (Dr. Welton FlakesKhan) scheduled Friday 05/08/22 at 10 AM Please follow up ASAP with your [facility] providers including Primary Care and Cardiology Monitor BP's with attempts at GDMT for HFrEF.   BP did not tolerate Entresto. Pt on midodrine with soft BP's at this time.  On low dose ARB and metoprolol. Repeat BMP, Mg, CBC in 1-2 weeks Repeat EKG at follow up visits to assess Qtc interval given patient is on methadone and amiodarone  Discharge Diagnoses: Principal Problem:   NSTEMI (non-ST elevated myocardial infarction) (HCC) Active Problems:   Acute on chronic combined systolic and diastolic CHF (congestive heart failure) (HCC)   Paroxysmal atrial fibrillation with RVR (HCC)   Type 2 diabetes mellitus with peripheral neuropathy (HCC)   Chronic obstructive pulmonary disease (COPD) (HCC)   Essential hypertension   Chronic pain   History of lung cancer   PVD (peripheral vascular disease) (HCC)   Alcohol use disorder, in remission   Tobacco use disorder   Spiculated nodule of upper lobe of left lung CT 04/25/21   Acute pulmonary edema (HCC)   Chronic atrial fibrillation (HCC)   Aortic valve stenosis  Resolved Problems:   * No resolved hospital problems. *  Hospital Course:  67 y.o. man with medical history significant for COPD, chronic combined CHF (EF 40 to 45%, G1 DD 03/21/2022)  type 2 diabetes mellitus, essential hypertension, PVD, tobacco use disorder, EtOH abuse in remission x 1 week, lung cancer s/p radiation, A-fib diagnosed during a hospitalization from 12/1 to 03/23/2022 (came in as code STEMI, subsequently ruled out and with no cath performed), rehospitalized 12/31 to 04/20/22 with rapid A-fib attributed to noncompliance  with metoprolol, who returns to the ED by EMS with a several day history of fatigue and dyspnea on exertion.  He complains of intermittent sharp right-sided chest pain.  Denies lower extremity edema or orthopnea.  He is compliant with his Eliquis.  Has had no cough, fever or chills.  States he felt so poorly he did not take his medications today.  He was brought in by EMS who reported a heart rate of 80-1 30 with otherwise normal vitals.     ED course and data review: On arrival, afebrile BP 112/97 with pulse 122 and otherwise normal vitals.  Troponin 245 with BNP 791.9.  WBC and respiratory viral panel negative for COVID flu and RSV.  Hemoglobin at baseline at 11.8.  BMP unremarkable.  EKG, personally viewed and interpreted showing A-fib at 122 with nonspecific ST-T wave changes. Chest x-ray showed mild cardiomegaly and pulmonary vascular congestion.  No focal consolidation or large pleural effusion. Patient was administered his home metoprolol with improvement in heart rate to 99 by admission.  He was also treated with a dose of IV Lasix for possible CHF and started on heparin infusion for possible NSTEMI.     1/7 : Patient had mild uptrend in high-sensitivity troponins however asymptomatic with no chest pain or palpitation/diaphoresis.     1/8- remains hemodynamically stable. Cardiology performed cath. Identified to have non-obstructive disease with Afib and significant aortic stenosis thought to be contributing factors to his presenting symptoms.   1/9- cardiology following for management of Afib and evaluation for AV replacement  1/10-1/11- stable from cardiac standpoint. PT evaluated and recommended SNF at dc. Awaiting SNF placement. Appears to be suffering from acute grief vs delirium.    1/12-1/13 - BP's low, improving with fluids.  Recurrent A-fib RVR.    1/14 >> 1/16 - further close monitoring of low BP's, medications titriated, midodrine had to be increased.  He improved with gentle IV  fluids.  Doing much better ambulating with therapy, no longer needs SNF placement.  Stable for discharge home today.    Assessment and Plan:  NSTEMI-  Patient presents with fatigue and dyspnea on exertion with troponin 245  Has right sided chest pain and with nonacute EKG Cath performed on 1/8 without intervention. Nonobstructive disease.  - cardiology consulted, Dr. Humphrey Rolls - Cardiology follow up at [facility] recommended - Transitioned from heparin >> Eliquis - Continue metoprolol and atorvastatin. - started Entresto - stopped due to severe hypotension - Started lowest dose losartan instead, BP tolerating okay with midodrine - Cardiology follow up as scheduled   Hypotension- suspect hypovolemic due to poor PO intake and possibly over-diuresis. -given small fluid boluses with fair response, BP seem improved with adequate hydration BP's improved on max dose midodrine, still soft  -maintain MAP > 65 -Hold Entresto and Lasix (orders d/c'd) -Suspect his BP will not tolerate Entresto -Started midodrine 1/13, had to increase to 10 mg TID -Monitor closely while attempting to resume GDMT for HFrEF   HFrEF  HTN- echo from 12/2 shows EF 40 to 45% with G1 DD. Echo from 1/8 reveals worsening function. EF now 35-40% with moderate decreased function, severe dilation of LV, LA and RA. Moderate to severe aortic stenosis.  Dyspnea on exertion with BNP 791 and pulmonary vascular congestion on chest x-ray at admission. Now appears dry, low BP's, possibly over-diuresed and from [place].  - Continue metoprolol - Holding Entresto and Lasix w hypotension - Started losartan very low dose 12.5 mg daily.  Will stop Entresto, BP will not tolerate it - Daily weights with intake and output monitoring - started on SGLT2i ([place]) - PT/OT evaluation- recommending SNF placement.    AKI- Cr baseline around 0.9.  Cr trend 1.42 >> 1.60>>1.55>>1.43 >>1.01>>0.97 >> 1.02 AKI likely due to over-diuresis and poor PO  intake, Entresto. - Hold Lasix and Entresto -- has been hypotensive, unable to resume despite normalized renal function - Off IV fluids after over-diuresis - BMP am   Leukocytosis - resolved 1/13.   Clinically no signs of infection. WBC 14 on 1/12, previously normal.  No fevers. Pt confused and poor historian, unable to elicit acute complaints for s/sx's of infection.    UA negative.  MRSA screen negative.   CXR 1/12 overnight showed mild vascular congestion without significant edema (no focal consolidation to suggest pneumonia).  Started fluids for lower BP's in setting of recurrent A-fib RVR, suspect over-diuresis - Monitor CBC - Stop Vanc/Cefepime started overnight - no s/sx's of infection, hypotension is due to A-fib RVR and low EF - Follow blood culture to completion   Paroxysmal atrial fibrillation with RVR - Rate normal  Heart rate 122 on arrival in the setting of missing medication on the day of arrival Recently hospitalized from 12/31 through 04/20/2022 with rapid A-fib attributed to medication noncompliance, Improved with home dose of metoprolol - Continue metoprolol - Continue PO amiodarone  - Explore barriers to medication compliance. Rece[facility] at [facility].  - Cardiology follow up as scheduled   Aortic stenosis- per early cardiology notes, if patient stabilizes  from Afib standpoint on amiodarone, will be evaluated for possible aortic valve replacement.  - continue to follow up with cardiology attending.    Type 2 diabetes mellitus with peripheral neuropathy (HCC) Controlled.  Last A1c 6.4 on 12/31 Sliding scale insulin coverage while hospitalized - started SGLT2i   Tobacco use disorder - Nicotine patch discontinued - start chantix instead to avoid cardiac implication with nicotine replacement therapy   Alcohol use disorder, in remission 12 pack beer daily, quit cold [place] weeks prior to admission and has no withdrawals.  Became overly sedated ativan dose  given.  - hold ativan unless needed for anxiety/withdrawal but appears to be outside withdrawal window.  - off CIWA, outside w/d timeframe and no symptoms   Dementia with acute intermittent delirium  acute grief vs depression - chaplain consult - consider psych consult - continue seroquel QHS - palliative consult   PVD (peripheral vascular disease) ([place]) Continue aspirin and atorvastatin History of amputation digits right foot   History of lung cancer- cta positive for Spiculated mixed solid and cystic nodule within the anterior left upper lobe measuring 18 mm, indeterminate. Will need follow up imaging in 3 months or sooner. Patient receives his oncology care through [place]. Unsure of the significance of the current findings vs what has been present on monitoring images prior to admission due to not having access to [place] chart. Discussed the findings with patient and his sister at bedside and recommended they discuss with oncology at follow up appointment. They endorsed understanding and agreement.  S/p radiation at the [facility] 2019 No acute issues suspected - continue oxygen PRN   Chronic pain Continue methadone and oxycodone   COPD- Not acutely exacerbated Continue home inhalers DuoNebs as needed       Consultants: Cardiology Procedures performed: Echo  Disposition: Home Diet recommendation:  Cardiac diet DISCHARGE MEDICATION: Allergies as of 05/05/2022       Reactions   Niacin Other (See Comments)   Penicillin G         Medication List     STOP taking these medications    lisinopril 5 MG tablet Commonly known as: ZESTRIL   metFORMIN 500 MG tablet Commonly known as: GLUCOPHAGE   metoprolol tartrate 50 MG tablet Commonly known as: LOPRESSOR       TAKE these medications    albuterol 108 (90 Base) MCG/ACT inhaler Commonly known as: VENTOLIN HFA INHALE 2 PUFFS BY ORAL INHALATION EVERY 4 TO 6 HOURS AS NEEDED FOR CHRONIC OBSTRUCTIVE LUNG DISEASE FOR BREATHING. BE  SURE TO WASH MOUTHPIECE WITH WARM WATER ONCE A WEEK   albuterol (2.5 MG/3ML) 0.083% nebulizer solution Commonly known as: PROVENTIL 1 AMPULE BY ORAL INHALATION TWO TIMES A DAY AND 1 AMPULE ONCE EVERY DAY AS NEEDED FOR SHORTNESS OF BREATH   amiodarone 200 MG tablet Commonly known as: PACERONE Take 1 tablet (200 mg total) by mouth daily.   apixaban 5 MG Tabs tablet Commonly known as: ELIQUIS Take 1 tablet (5 mg total) by mouth 2 (two) times daily.   atorvastatin 80 MG tablet Commonly known as: LIPITOR Take 1 tablet (80 mg total) by mouth daily.   ciclesonide 160 MCG/ACT inhaler Commonly known as: ALVESCO Inhale 2 puffs into the lungs 2 (two) times daily.   clopidogrel 75 MG tablet Commonly known as: PLAVIX Take 1 tablet (75 mg total) by mouth daily.   fluticasone-salmeterol 250-50 MCG/ACT Aepb Commonly known as: ADVAIR Inhale 1 puff into the lungs in the morning and at bedtime.  furosemide 20 MG tablet Commonly known as: Lasix Take 1 tablet (20 mg total) by mouth daily as needed (increasing swelling or weight gain). What changed:  how much to take when to take this reasons to take this   gabapentin 300 MG capsule Commonly known as: NEURONTIN Take 1 capsule (300 mg total) by mouth 3 (three) times daily as needed. What changed:  when to take this reasons to take this   losartan 25 MG tablet Commonly known as: COZAAR Take 0.5 tablets (12.5 mg total) by mouth daily.   methadone 10 MG tablet Commonly known as: DOLOPHINE Take 1 tablet by mouth 2 (two) times daily. What changed: Another medication with the same name was removed. Continue taking this medication, and follow the directions you see here.   metoprolol succinate 50 MG 24 hr tablet Commonly known as: TOPROL-XL Take 1 tablet (50 mg total) by mouth daily. Take with or immediately following a meal.   nitroGLYCERIN 0.4 MG SL tablet Commonly known as: NITROSTAT Place 1 tablet (0.4 mg total) under the tongue  every 5 (five) minutes x 3 doses as needed for chest pain.   oxyCODONE 5 MG immediate release tablet Commonly known as: Oxy IR/ROXICODONE Take 10 mg by mouth every 4 (four) hours as needed for severe pain.   thiamine 100 MG tablet Commonly known as: Vitamin B-1 Take 1 tablet (100 mg total) by mouth daily.   tiotropium 18 MCG inhalation capsule Commonly known as: SPIRIVA Place 1 capsule (18 mcg total) into inhaler and inhale daily.   Tiotropium Bromide-Olodaterol 2.5-2.5 MCG/ACT Aers INHALE 2 INHALATIONS BY ORAL INHALATION ONCE EVERY DAY        Follow-up Information     Dionisio Jabron, MD. Go to.   Specialty: Cardiology Why: follow up on 05/08/22 at 10:00 or call to reschedule Contact information: [place] [address] [phone]         [facility], [place] an appointment as soon as possible for a visit.   Specialty: General Practice Why: Hospital follow up in 1-2 weeks Contact information: [place] [address] [phone]                Discharge Exam: Danley Danker Weights   04/29/22 0820 05/01/22 1247 05/04/22 1300  Weight: 61.9 kg 63.7 kg 66 kg   General exam: awake, alert, no acute distress HEENT: atraumatic, clear conjunctiva, anicteric sclera, moist mucus membranes, hearing grossly normal  Respiratory system: CTAB, no wheezes, rales or rhonchi, normal respiratory effort. Cardiovascular system: normal S1/S2, RRR, no JVD, murmurs, rubs, gallops, no pedal edema.   Gastrointestinal system: soft, NT, ND, no HSM felt, +bowel sounds. Central nervous system: A&O x4. no gross focal neurologic deficits, normal speech Extremities: moves all , no edema, normal tone Skin: dry, intact, normal temperature, normal color, No rashes, lesions or ulcers Psychiatry: normal mood, congruent affect, judgement and insight appear normal   Condition at discharge: stable  The results of significant diagnostics from this hospitalization  (including imaging, microbiology, ancillary and laboratory) are listed below for reference.   Imaging Studies: DG Chest Port 1 View  Result Date: 05/01/2022 CLINICAL DATA:  Tachycardia EXAM: PORTABLE CHEST 1 VIEW COMPARISON:  04/25/2022 FINDINGS: Cardiac shadow is stable. Aortic calcifications are again seen. Lungs are well aerated bilaterally. No focal infiltrate or effusion is seen. Mild vascular congestion is again identified. No bony abnormality is seen. IMPRESSION: Mild vascular congestion without significant edema. Electronically Signed   By: Elta Guadeloupe  Lukens M.D.   On: 05/01/2022 23:53   CARDIAC CATHETERIZATION  Result Date: 04/27/2022   Dist RCA lesion is 70% stenosed. Conclusion: Diagnostic cardiac catheterization was done by Dr. Humphrey Rolls. 70% stenosis in the mid right coronary artery not significant by fractional flow reserve evaluation with an RFR ratio of 0.98.  In addition, the patient seems to have moderate disease affecting the LAD and OM branch of the left circumflex. Heavily calcified aortic valve on fluoroscopy with restricted opening. Recommendations: Recommend medical therapy for coronary artery disease.  No revascularization is indicated. Continue evaluation for aortic stenosis which might be severe.  Echocardiogram was suggestive of low-flow low gradient severe aortic stenosis. The patient does have significant peripheral arterial disease as well. Resume heparin drip 8 hours after sheath pull and the patient can go back on Eliquis 5 mg twice daily starting tomorrow if no bleeding issues.   CARDIAC CATHETERIZATION  Result Date: 04/27/2022   Dist RCA lesion is 70% stenosed.   1st Mrg lesion is 55% stenosed.   Mid LAD lesion is 50% stenosed. Will do FFR of distal RCA and consider PCI.   ECHOCARDIOGRAM COMPLETE  Result Date: 04/27/2022    ECHOCARDIOGRAM REPORT   Patient Name:   MACLAIN COHRON Date of Exam: 04/27/2022 Medical Rec #:  [id number]        Height:       70.0 in Accession #:     [id number]       Weight:       164.0 lb Date of Birth:  10/26/1955        BSA:          1.918 m Patient Age:    67 years         BP:           129/71 mmHg Patient Gender: M                HR:           83 bpm. Exam Location:  [facility] Procedure: 2D Echo, Cardiac Doppler and Color Doppler Indications:     NSTEMI I21.4  History:         Patient has prior history of Echocardiogram examinations, most                  recent 03/21/2022. COPD, Arrythmias:Atrial Fibrillation; Risk                  Factors:Diabetes and Hypertension. Bicuspid AOV.  Sonographer:     Sherrie Sport Referring Phys:  [id number] Athena Masse Diagnosing Phys: Neoma Laming  Sonographer Comments: Suboptimal apical window. IMPRESSIONS  1. Left ventricular ejection fraction, by estimation, is 35 to 40%. The left ventricle has moderately decreased function. The left ventricle demonstrates global hypokinesis. The left ventricular internal cavity size was severely dilated. There is moderate concentric left ventricular hypertrophy. Left ventricular diastolic parameters are indeterminate.  2. Right ventricular systolic function is severely reduced. The right ventricular size is severely enlarged. Moderately increased right ventricular wall thickness.  3. Left atrial size was severely dilated.  4. Right atrial size was severely dilated.  5. The mitral valve is degenerative. Mild to moderate mitral valve regurgitation.  6. Tricuspid valve regurgitation is moderate.  7. The aortic valve is calcified. Aortic valve regurgitation is mild to moderate. Moderate to severe aortic valve stenosis. Conclusion(s)/Recommendation(s): Findings consistent with severe valvular heart disease. Valvular findings as outlined below. FINDINGS  Left Ventricle: Left ventricular ejection fraction, by estimation,  is 35 to 40%. The left ventricle has moderately decreased function. The left ventricle demonstrates global hypokinesis. The left ventricular internal cavity size was severely dilated.  There is moderate concentric left ventricular hypertrophy. Left ventricular diastolic parameters are indeterminate. Right Ventricle: The right ventricular size is severely enlarged. Moderately increased right ventricular wall thickness. Right ventricular systolic function is severely reduced. Left Atrium: Left atrial size was severely dilated. Right Atrium: Right atrial size was severely dilated. Pericardium: There is no evidence of pericardial effusion. Mitral Valve: The mitral valve is degenerative in appearance. There is severe calcification of the mitral valve leaflet(s). Mild to moderate mitral valve regurgitation. MV peak gradient, 13.4 mmHg. The mean mitral valve gradient is 7.0 mmHg. Tricuspid Valve: The tricuspid valve is normal in structure. Tricuspid valve regurgitation is moderate. Aortic Valve: The aortic valve is calcified. Aortic valve regurgitation is mild to moderate. Moderate to severe aortic stenosis is present. Aortic valve mean gradient measures 15.0 mmHg. Aortic valve peak gradient measures 27.0 mmHg. Aortic valve area, by VTI measures 0.74 cm. Pulmonic Valve: The pulmonic valve was grossly normal. Pulmonic valve regurgitation is mild. Aorta: The aortic root, ascending aorta and aortic arch are all structurally normal, with no evidence of dilitation or obstruction. IAS/Shunts: No atrial level shunt detected by color flow Doppler.  LEFT VENTRICLE PLAX 2D LVIDd:         5.10 cm LVIDs:         4.20 cm LV PW:         1.50 cm LV IVS:        1.00 cm LVOT diam:     2.00 cm LV SV:         39 LV SV Index:   20 LVOT Area:     3.14 cm  RIGHT VENTRICLE RV Basal diam:  5.20 cm RV Mid diam:    4.70 cm LEFT ATRIUM             Index        RIGHT ATRIUM           Index LA diam:        4.80 cm 2.50 cm/m   RA Area:     29.50 cm LA Vol (A2C):   64.1 ml 33.41 ml/m  RA Volume:   101.00 ml 52.65 ml/m LA Vol (A4C):   47.5 ml 24.76 ml/m LA Biplane Vol: 59.4 ml 30.96 ml/m  AORTIC VALVE                      PULMONIC VALVE AV Area (Vmax):    0.74 cm      PR End Diast Vel: 18.15 msec AV Area (Vmean):   0.77 cm AV Area (VTI):     0.74 cm AV Vmax:           259.67 cm/s AV Vmean:          177.667 cm/s AV VTI:            0.522 m AV Peak Grad:      27.0 mmHg AV Mean Grad:      15.0 mmHg LVOT Vmax:         61.50 cm/s LVOT Vmean:        43.500 cm/s LVOT VTI:          0.123 m LVOT/AV VTI ratio: 0.24  AORTA Ao Root diam: 3.10 cm MITRAL VALVE  TRICUSPID VALVE MV Area (PHT): 4.68 cm     TR Peak grad:   49.0 mmHg MV Area VTI:   0.89 cm     TR Vmax:        350.00 cm/s MV Peak grad:  13.4 mmHg MV Mean grad:  7.0 mmHg     SHUNTS MV Vmax:       1.83 m/s     Systemic VTI:  0.12 m MV Vmean:      125.0 cm/s   Systemic Diam: 2.00 cm MV Decel Time: 162 msec MV E velocity: 187.00 cm/s Neoma Laming Electronically signed by Neoma Laming Signature Date/Time: 04/27/2022/1:26:15 PM    Final    CT Angio Chest Pulmonary Embolism (PE) W or WO Contrast  Result Date: 04/26/2022 CLINICAL DATA:  Dyspnea, chronic, chest wall or pleura disease suspected EXAM: CT ANGIOGRAPHY CHEST WITH CONTRAST TECHNIQUE: Multidetector CT imaging of the chest was performed using the standard protocol during bolus administration of intravenous contrast. Multiplanar CT image reconstructions and MIPs were obtained to evaluate the vascular anatomy. RADIATION DOSE REDUCTION: This exam was performed according to the departmental dose-optimization program which includes automated exposure control, adjustment of the mA and/or kV according to patient size and/or use of iterative reconstruction technique. CONTRAST:  169mL OMNIPAQUE IOHEXOL 350 MG/ML SOLN COMPARISON:  None Available. FINDINGS: Cardiovascular: Adequate opacification of the pulmonary arterial tree. No intraluminal filling defect identified to suggest acute pulmonary embolism. Moderate multi-vessel coronary artery calcification. Mild global cardiomegaly. Extensive calcification of the mitral valve  annulus. Mild calcification of the aortic valve leaflets. No pericardial effusion. Moderate atherosclerotic calcification of the thoracic aorta. No aortic aneurysm. Mediastinum/Nodes: No enlarged mediastinal, hilar, or axillary lymph nodes. Thyroid gland, trachea, and esophagus demonstrate no significant findings. Lungs/Pleura: Spiculated mixed solid and cystic nodule within the anterior left upper lobe measures 11 x 12 x 18 mm on axial image # 72/7 and coronal image # 51/9, indeterminate. This may represent a a subacute focal inflammatory process or focal neoplasm. Associated retraction of the adjacent paramediastinal pleura. Mild emphysema. Small bilateral pleural effusions. Bronchial wall thickening in keeping with airway inflammation. No pneumothorax. No central obstructing lesion. Upper Abdomen: Mild ascites.  No acute abnormality. Musculoskeletal: No acute bone abnormality. No lytic or blastic bone lesion. Review of the MIP images confirms the above findings. IMPRESSION: 1. No pulmonary embolism. 2. Moderate multi-vessel coronary artery calcification. Mild global cardiomegaly. 3. Spiculated mixed solid and cystic nodule within the anterior left upper lobe measuring 18 mm, indeterminate. Consider one of the following in 3 months for both low-risk and high-risk individuals: (a) repeat chest CT, (b) follow-up PET-CT, or (c) tissue sampling. This recommendation follows the consensus statement: Guidelines for Management of Incidental Pulmonary Nodules Detected on CT Images: From the Fleischner Society 2017; Radiology 2017; 284:228-243. 4. Mild emphysema.  Airway inflammation. 5. Small bilateral pleural effusions. 6. Mild ascites. Aortic Atherosclerosis (ICD10-I70.0) and Emphysema (ICD10-J43.9). Electronically Signed   By: Fidela Salisbury M.D.   On: 04/26/2022 02:23   DG Chest Portable 1 View  Result Date: 04/25/2022 CLINICAL DATA:  Shortness of breath. EXAM: PORTABLE CHEST 1 VIEW COMPARISON:  Radiographs dated  April 19, 2022. FINDINGS: The heart is mildly enlarged. Atherosclerotic calcification of the aortic arch. Mild pulmonary vascular congestion. No focal consolidation or large pleural effusion. Thoracic spondylosis. IMPRESSION: Mild cardiomegaly and pulmonary vascular congestion. No focal consolidation or large pleural effusion. Electronically Signed   By: Keane Police D.O.   On: 04/25/2022 20:30   DG  Chest Portable 1 View  Result Date: 04/19/2022 CLINICAL DATA:  Chest pain and shortness of breath. EXAM: PORTABLE CHEST 1 VIEW COMPARISON:  April 04, 2022 FINDINGS: The cardiac silhouette is mildly enlarged and unchanged in size. There is marked severity calcification of the aortic arch. Mild, diffusely increased interstitial lung markings are seen. Mild atelectatic changes are also noted within the bilateral lung bases. There is no evidence of a pleural effusion or pneumothorax. Multilevel degenerative changes seen throughout the thoracic spine. IMPRESSION: 1. Stable cardiomegaly with additional findings consistent with mild interstitial edema. 2. Mild bibasilar atelectasis. Electronically Signed   By: Virgina Norfolk M.D.   On: 04/19/2022 01:41    Microbiology: Results for orders placed or performed during the hospital encounter of 04/25/22  Resp panel by RT-PCR (RSV, Flu A&B, Covid) Anterior Nasal Swab     Status: None   Collection Time: 04/25/22  8:17 PM   Specimen: Anterior Nasal Swab  Result Value Ref Range Status   SARS Coronavirus 2 by RT PCR NEGATIVE NEGATIVE Final    Comment: (NOTE) SARS-CoV-2 target nucleic acids are NOT DETECTED.  The SARS-CoV-2 RNA is generally detectable in upper respiratory specimens during the acute phase of infection. The lowest concentration of SARS-CoV-2 viral copies this assay can detect is 138 copies/mL. A negative result does not preclude SARS-Cov-2 infection and should not be used as the sole basis for treatment or other patient management decisions. A  negative result may occur with  improper specimen collection/handling, submission of specimen other than nasopharyngeal swab, presence of viral mutation(s) within the areas targeted by this assay, and inadequate number of viral copies(<138 copies/mL). A negative result must be combined with clinical observations, patient history, and epidemiological information. The expected result is Negative.  Fact Sheet for Patients:  EntrepreneurPulse.com.au  Fact Sheet for Healthcare Providers:  IncredibleEmployment.be  This test is no t yet approved or cleared by the [place] FDA and  has been authorized for detection and/or diagnosis of SARS-CoV-2 by FDA under an Emergency Use Authorization (EUA). This EUA will remain  in effect (meaning this test can be used) for the duration of the COVID-19 declaration under Section 564(b)(1) of the Act, 21 U.S.C.section 360bbb-3(b)(1), unless the authorization is terminated  or revoked sooner.       Influenza A by PCR NEGATIVE NEGATIVE Final   Influenza B by PCR NEGATIVE NEGATIVE Final    Comment: (NOTE) The Xpert Xpress SARS-CoV-2/FLU/RSV plus assay is intended as an aid in the diagnosis of influenza from Nasopharyngeal swab specimens and should not be used as a sole basis for treatment. Nasal washings and aspirates are unacceptable for Xpert Xpress SARS-CoV-2/FLU/RSV testing.  Fact Sheet for Patients: EntrepreneurPulse.com.au  Fact Sheet for Healthcare Providers: IncredibleEmployment.be  This test is not yet approved or cleared by the [place] FDA and has been authorized for detection and/or diagnosis of SARS-CoV-2 by FDA under an Emergency Use Authorization (EUA). This EUA will remain in effect (meaning this test can be used) for the duration of the COVID-19 declaration under Section 564(b)(1) of the Act, 21 U.S.C. section 360bbb-3(b)(1), unless the authorization  is terminated or revoked.     Resp Syncytial Virus by PCR NEGATIVE NEGATIVE Final    Comment: (NOTE) Fact Sheet for Patients: EntrepreneurPulse.com.au  Fact Sheet for Healthcare Providers: IncredibleEmployment.be  This test is not yet approved or cleared by the [place] FDA and has been authorized for detection and/or diagnosis of SARS-CoV-2 by FDA under an Emergency Use Authorization (EUA).  This EUA will remain in effect (meaning this test can be used) for the duration of the COVID-19 declaration under Section 564(b)(1) of the Act, 21 U.S.C. section 360bbb-3(b)(1), unless the authorization is terminated or revoked.  Performed at [facility], [address] [place], [place] [address]   Culture, blood (Routine X 2) w Reflex to ID Panel     Status: None   Collection Time: 05/01/22 11:52 PM   Specimen: BLOOD  Result Value Ref Range Status   Specimen Description BLOOD BLOOD LEFT HAND  Final   Special Requests   Final    BOTTLES DRAWN AEROBIC AND ANAEROBIC Blood Culture adequate volume   Culture   Final    NO GROWTH 5 DAYS Performed at [facility], [address] [place], [place] [address]    Report Status 05/06/2022 FINAL  Final  Culture, blood (Routine X 2) w Reflex to ID Panel     Status: None   Collection Time: 05/01/22 11:52 PM   Specimen: BLOOD  Result Value Ref Range Status   Specimen Description BLOOD BLOOD RIGHT HAND  Final   Special Requests   Final    BOTTLES DRAWN AEROBIC AND ANAEROBIC Blood Culture adequate volume   Culture   Final    NO GROWTH 5 DAYS Performed at [facility], [address], [place], [place] [address]    Report Status 05/07/2022 FINAL  Final  MRSA Next Gen by PCR, Nasal     Status: None   Collection Time: 05/02/22  1:56 AM   Specimen: Urine, In & Out Cath; Nasal Swab  Result Value Ref Range Status   MRSA by PCR Next Gen NOT DETECTED NOT DETECTED Final    Comment:  (NOTE) The GeneXpert MRSA Assay (FDA approved for NASAL specimens only), is one component of a comprehensive MRSA colonization surveillance program. It is not intended to diagnose MRSA infection nor to guide or monitor treatment for MRSA infections. Test performance is not FDA approved in patients less than 94 years old. Performed at [facility] [address] [place], [place] [address]     Labs: CBC: No results for input(s): "WBC", "NEUTROABS", "HGB", "HCT", "MCV", "PLT" in the last 168 hours.  Basic Metabolic Panel: No results for input(s): "NA", "K", "CL", "CO2", "GLUCOSE", "BUN", "CREATININE", "CALCIUM", "MG", "PHOS" in the last 168 hours.  Liver Function Tests: No results for input(s): "AST", "ALT", "ALKPHOS", "BILITOT", "PROT", "ALBUMIN" in the last 168 hours.  CBG: No results for input(s): "GLUCAP" in the last 168 hours.   Discharge time spent: greater than 30 minutes.  Signed: Pennie Banter, DO [facility] 05/13/2022

## 2022-05-05 NOTE — Progress Notes (Unsigned)
Notified by RN patient called regarding high cost of meds from retail pharmacy. Will send the Jardiance and midodrine to [place]. Hopefully more affordable there.

## 2022-05-05 NOTE — TOC Transition Note (Signed)
Transition of Care [facility]) - CM/SW Discharge Note   Patient Details  Name: Jason Beck MRN: [id number] Date of Birth: 10/02/1955  [facility]) CM/SW Contact:  Laurena Slimmer, RN Phone Number: 05/05/2022, 1:00 PM   Clinical Narrative:     Discharge home.  Referral for outpatient therapy sent to [place].   TOC signing off.      Barriers to Discharge: Continued Medical Work up   Patient Goals and CMS Choice      Discharge Placement                         Discharge Plan and Services Additional resources added to the After Visit Summary for                                       Social Determinants of Health (SDOH) Interventions SDOH Screenings   Food Insecurity: No Food Insecurity (04/27/2022)  Housing: Low Risk  (04/27/2022)  Transportation Needs: No Transportation Needs (04/27/2022)  Utilities: Not At Risk (04/27/2022)  Tobacco Use: High Risk (04/28/2022)     Readmission Risk Interventions    04/28/2022   11:30 AM  Readmission Risk Prevention Plan  Transportation Screening Complete  PCP or Specialist Appt within 3-5 Days Complete  HRI or [place] Patient refused  Social Work Consult for [place] Planning/Counseling Complete  Palliative Care Screening Not Applicable  Medication Review Press photographer) Complete

## 2022-05-05 NOTE — Progress Notes (Signed)
     [place] REFERRAL        Occupational Therapy * Physical Therapy * Speech Therapy                           DATE 05/05/2022  PATIENT NAME Jason Beck  PATIENT MRN [id number]        DIAGNOSIS/DIAGNOSIS CODE  ACUTE MYOCARDIAL INFARCTION, DISCHARGED ALIVE WITH [phone], ACUTE MYOCARDIAL INFARCTION, DISCHARGED ALIVE WITH [phone]   DATE OF DISCHARGE: 05/05/2022       [place], [place]   PCP [place] [phone]      Dear Provider (Name: [place]  Fax: [phone]   I certify that I have examined this patient and that occupational/physical/speech therapy is necessary on an outpatient basis.    The patient has expressed interest in completing their recommended course of therapy at your  location.  Once a formal order from the patient's primary care physician has been obtained, please  contact him/her to schedule an appointment for evaluation at your earliest convenience.   [ x]  Physical Therapy Evaluate and Treat  [  ]  Occupational Therapy Evaluate and Treat  [  ]  Speech Therapy Evaluate and Treat         The patient's primary care physician (listed above) must furnish and be responsible for a formal order such that the recommended services may be furnished while under the primary physician's care, and that the plan of care will be established and reviewed every 30 days (or more often if condition necessitates).

## 2022-05-06 LAB — CULTURE, BLOOD (ROUTINE X 2)
Culture: NO GROWTH
Special Requests: ADEQUATE

## 2022-05-07 LAB — CULTURE, BLOOD (ROUTINE X 2)
Culture: NO GROWTH
Special Requests: ADEQUATE

## 2022-05-13 ENCOUNTER — Encounter: Payer: Self-pay | Admitting: Pharmacy Technician

## 2022-05-13 ENCOUNTER — Encounter: Payer: No Typology Code available for payment source | Admitting: Family

## 2022-05-13 ENCOUNTER — Ambulatory Visit: Payer: No Typology Code available for payment source | Attending: Family | Admitting: Family

## 2022-05-13 ENCOUNTER — Encounter: Payer: Self-pay | Admitting: Family

## 2022-05-13 VITALS — BP 111/63 | HR 78 | Resp 18 | Wt 155.1 lb

## 2022-05-13 DIAGNOSIS — I10 Essential (primary) hypertension: Secondary | ICD-10-CM

## 2022-05-13 DIAGNOSIS — Z79899 Other long term (current) drug therapy: Secondary | ICD-10-CM | POA: Diagnosis not present

## 2022-05-13 DIAGNOSIS — I480 Paroxysmal atrial fibrillation: Secondary | ICD-10-CM

## 2022-05-13 DIAGNOSIS — F17210 Nicotine dependence, cigarettes, uncomplicated: Secondary | ICD-10-CM | POA: Insufficient documentation

## 2022-05-13 DIAGNOSIS — J4489 Other specified chronic obstructive pulmonary disease: Secondary | ICD-10-CM | POA: Insufficient documentation

## 2022-05-13 DIAGNOSIS — Z7902 Long term (current) use of antithrombotics/antiplatelets: Secondary | ICD-10-CM | POA: Diagnosis not present

## 2022-05-13 DIAGNOSIS — E1142 Type 2 diabetes mellitus with diabetic polyneuropathy: Secondary | ICD-10-CM | POA: Diagnosis not present

## 2022-05-13 DIAGNOSIS — I110 Hypertensive heart disease with heart failure: Secondary | ICD-10-CM | POA: Insufficient documentation

## 2022-05-13 DIAGNOSIS — I5022 Chronic systolic (congestive) heart failure: Secondary | ICD-10-CM

## 2022-05-13 DIAGNOSIS — E119 Type 2 diabetes mellitus without complications: Secondary | ICD-10-CM | POA: Diagnosis not present

## 2022-05-13 DIAGNOSIS — I4891 Unspecified atrial fibrillation: Secondary | ICD-10-CM | POA: Insufficient documentation

## 2022-05-13 DIAGNOSIS — F1090 Alcohol use, unspecified, uncomplicated: Secondary | ICD-10-CM | POA: Insufficient documentation

## 2022-05-13 DIAGNOSIS — Z7901 Long term (current) use of anticoagulants: Secondary | ICD-10-CM | POA: Diagnosis not present

## 2022-05-13 DIAGNOSIS — Z789 Other specified health status: Secondary | ICD-10-CM

## 2022-05-13 DIAGNOSIS — I509 Heart failure, unspecified: Secondary | ICD-10-CM | POA: Diagnosis not present

## 2022-05-13 NOTE — Patient Instructions (Addendum)
Continue weighing daily and call for an overnight weight gain of 3 pounds or more or a weekly weight gain of more than 5 pounds.   If you have voicemail, please make sure your mailbox is cleaned out so that we may leave a message and please make sure to listen to any voicemails.    Bring ALL medications including any over the counter or vitamins to every visit.    Take 1 extra lasix today.

## 2022-05-13 NOTE — Progress Notes (Signed)
Patient ID: Jason Beck, male    DOB: 06-21-1955, 67 y.o.   MRN: [id number]  HPI  Mr Ramoutar is a 67 y/o male with a history of DM, HTN, atrial fibrillation, COPD, depression, asthma, bicuspid aortic valve, tobacco/ alcohol use and chronic heart failure.   Echo done 04/27/22 showed an EF of 35-40% along with moderate LVH, severe LAE, mild/ moderate MR, moderate TR, mild/ moderate AR and mod/ severe AS. Echo report from 03/21/22 showed an EF of 40-45% along with mild/ moderate LAE, mild/moderate MR, mild/moderate AR, severe AS & myxomatous mitral valve.   LHC done 04/27/22 showed:   Dist RCA lesion is 70% stenosed.   1st Mrg lesion is 55% stenosed.   Mid LAD lesion is 50% stenosed. Diagnostic cardiac catheterization was done by Dr. Humphrey Rolls. 70% stenosis in the mid right coronary artery not significant by fractional flow reserve evaluation with an RFR ratio of 0.98.  In addition, the patient seems to have moderate disease affecting the LAD and OM branch of the left circumflex.Heavily calcified aortic valve on fluoroscopy with restricted opening.   Admitted 04/25/22 due to SOB and found to be in AF RVR and NSTEMI. Cath done, entresto held due to hypotension. Was in the ED 04/19/22 due to AF RVR. Was in the ED 04/04/22 due to AF and HF after not having his meds for ~ 10 days. Admitted 03/20/22 due to midsternal chest pain. Concern for STEMI but then cancelled. Cardiology consult obtained. Mild alcohol withdrawal so started on CIWA. Lasix held. Discharged after 3 days.   He presents today for a follow-up visit with a chief complaint of moderate shortness of breath with minimal exertion. Describes this as chronic in nature although improved from recent admission. Has associated fatigue, cough, intermittent chest pain, palpitations, dizziness and 5 pound weight gain over the last week. Denies any difficulty sleeping, abdominal distention or pedal edema.   Says that he gets Mom's meals and has been eating  more since he was discharged. Not adding salt to his food. Hasn't drank any alcohol since his recent admission.   Currently seeing PCP and cardiology at the [place]. Does not have upcoming [facility] app[facility] yet. Says that he was in the [employer] during the [place] war.   Past Medical History:  Diagnosis Date   Asthma    Atrial fibrillation (HCC)    Bicuspid aortic valve    CHF (congestive heart failure) (HCC)    COPD (chronic obstructive pulmonary disease) ([place])    Depression    Diabetes mellitus without complication ([place])    Hypertension    Past Surgical History:  Procedure Laterality Date   INTRAVASCULAR PRESSURE WIRE/FFR STUDY N/A 04/27/2022   Procedure: INTRAVASCULAR PRESSURE WIRE/FFR STUDY;  Surgeon: Wellington Hampshire, MD;  Location: [place] [facility];  Service: Cardiovascular;  Laterality: N/A;   LEFT HEART CATH AND CORONARY ANGIOGRAPHY N/A 04/27/2022   Procedure: LEFT HEART CATH AND CORONARY ANGIOGRAPHY with  possible coronary intervention;  Surgeon: Dionisio Hanna, MD;  Location: [place] [facility];  Service: Cardiovascular;  Laterality: N/A;   toe amputations     No family history on file. Social History   Tobacco Use   Smoking status: Every Day    Packs/day: 1.50    Types: Cigarettes   Smokeless tobacco: Never  Substance Use Topics   Alcohol use: Yes    Alcohol/week: 2.0 - 3.0 standard drinks of alcohol    Types: 2 - 3 Cans of beer per week  Comment: daily   Allergies  Allergen Reactions   Niacin Other (See Comments)   Penicillin G    Prior to Admission medications   Medication Sig Start Date End Date Taking? Authorizing Provider  albuterol (PROVENTIL) (2.5 MG/3ML) 0.083% nebulizer solution 1 AMPULE BY ORAL INHALATION TWO TIMES A DAY AND 1 AMPULE ONCE EVERY DAY AS NEEDED FOR SHORTNESS OF BREATH 02/03/22  Yes [provider]  albuterol (VENTOLIN HFA) 108 (90 Base) MCG/ACT inhaler INHALE 2 PUFFS BY ORAL INHALATION EVERY 4 TO 6 HOURS AS NEEDED FOR CHRONIC OBSTRUCTIVE LUNG  DISEASE FOR BREATHING. BE SURE TO WASH MOUTHPIECE WITH WARM WATER ONCE A WEEK 11/12/21  Yes [provider]  amiodarone (PACERONE) 200 MG tablet Take 1 tablet (200 mg total) by mouth daily. 05/06/22  Yes Pennie Banter, DO  apixaban (ELIQUIS) 5 MG TABS tablet Take 1 tablet (5 mg total) by mouth 2 (two) times daily. 04/04/22 05/13/22 Yes Georga Hacking, MD  atorvastatin (LIPITOR) 80 MG tablet Take 1 tablet (80 mg total) by mouth daily. 04/04/22 03/30/23 Yes Georga Hacking, MD  ciclesonide (ALVESCO) 160 MCG/ACT inhaler Inhale 2 puffs into the lungs 2 (two) times daily. 04/04/22 05/13/22 Yes Georga Hacking, MD  clopidogrel (PLAVIX) 75 MG tablet Take 1 tablet (75 mg total) by mouth daily. 04/04/22  Yes Georga Hacking, MD  empagliflozin (JARDIANCE) 10 MG TABS tablet Take 1 tablet (10 mg total) by mouth daily. 05/05/22  Yes Esaw Grandchild A, DO  fluticasone-salmeterol (ADVAIR) 250-50 MCG/ACT AEPB Inhale 1 puff into the lungs in the morning and at bedtime. 04/04/22  Yes Georga Hacking, MD  furosemide (LASIX) 20 MG tablet Take 1 tablet (20 mg total) by mouth daily as needed (increasing swelling or weight gain). 05/05/22 11/01/22 Yes Esaw Grandchild A, DO  gabapentin (NEURONTIN) 300 MG capsule Take 1 capsule (300 mg total) by mouth 3 (three) times daily as needed. 05/05/22 06/04/22 Yes Esaw Grandchild A, DO  metFORMIN (GLUCOPHAGE) 500 MG tablet Take 500 mg by mouth 2 (two) times daily with a meal.   Yes [provider]  methadone (DOLOPHINE) 10 MG tablet Take 1 tablet by mouth 2 (two) times daily. 04/08/22  Yes [provider]  metoprolol succinate (TOPROL-XL) 50 MG 24 hr tablet Take 1 tablet (50 mg total) by mouth daily. Take with or immediately following a meal. 05/05/22  Yes Esaw Grandchild A, DO  nitroGLYCERIN (NITROSTAT) 0.4 MG SL tablet Place 1 tablet (0.4 mg total) under the tongue every 5 (five) minutes x 3 doses as needed for chest pain. 05/05/22  Yes Esaw Grandchild A, DO  oxyCODONE (OXY IR/ROXICODONE) 5 MG immediate release tablet Take 10 mg by mouth every 4 (four) hours as needed for severe pain. 04/08/22  Yes [provider]  Tiotropium Bromide-Olodaterol 2.5-2.5 MCG/ACT AERS INHALE 2 INHALATIONS BY ORAL INHALATION ONCE EVERY DAY 02/03/22  Yes [provider]  losartan (COZAAR) 25 MG tablet Take 0.5 tablets (12.5 mg total) by mouth daily. 05/05/22   Pennie Banter, DO  midodrine (PROAMATINE) 10 MG tablet Take 1 tablet (10 mg total) by mouth 3 (three) times daily with meals. 05/05/22   Esaw Grandchild A, DO  thiamine (VITAMIN B-1) 100 MG tablet Take 1 tablet (100 mg total) by mouth daily. Patient not taking: Reported on 05/13/2022 05/05/22   Esaw Grandchild A, DO  tiotropium (SPIRIVA) 18 MCG inhalation capsule Place 1 capsule (18 mcg total) into inhaler and inhale daily. Patient not taking: Reported  on 05/13/2022 04/04/22 06/03/22  Rada Hay, MD   Review of Systems  Constitutional:  Positive for fatigue. Negative for appetite change.  HENT:  Negative for congestion, postnasal drip and sore throat.   Eyes: Negative.   Respiratory:  Positive for cough and shortness of breath (moderate). Negative for chest tightness.   Cardiovascular:  Positive for chest pain (occasionally) and palpitations. Negative for leg swelling.  Gastrointestinal:  Positive for constipation. Negative for abdominal distention and abdominal pain.  Endocrine: Negative.   Genitourinary: Negative.   Musculoskeletal:  Negative for back pain and neck pain.  Skin: Negative.   Allergic/Immunologic: Negative.   Neurological:  Positive for dizziness and light-headedness.  Hematological:  Negative for adenopathy. Does not bruise/bleed easily.  Psychiatric/Behavioral:  Negative for dysphoric mood and sleep disturbance (sleeping on 1 pillow). The patient is not nervous/anxious.    Vitals:   05/13/22 0908  BP: 111/63  Pulse: 78  Resp: 18  SpO2: 93%  Weight:  155 lb 2 oz (70.4 kg)   Wt Readings from Last 3 Encounters:  05/13/22 155 lb 2 oz (70.4 kg)  05/04/22 145 lb 9.6 oz (66 kg)  04/19/22 164 lb (74.4 kg)   Lab Results  Component Value Date   CREATININE 1.02 05/05/2022   CREATININE 0.97 05/04/2022   CREATININE 1.01 05/03/2022   Physical Exam Vitals and nursing note reviewed.  Constitutional:      Appearance: He is well-developed.  HENT:     Head: Normocephalic and atraumatic.  Neck:     Vascular: No JVD.  Cardiovascular:     Rate and Rhythm: Normal rate. Rhythm irregular.  Pulmonary:     Effort: Pulmonary effort is normal.     Breath sounds: Examination of the right-upper field reveals wheezing. Examination of the left-upper field reveals wheezing. Wheezing present. No rhonchi or rales.  Abdominal:     Palpations: Abdomen is soft.     Tenderness: There is no abdominal tenderness.  Musculoskeletal:     Cervical back: Normal range of motion and neck supple.     Right lower leg: Tenderness present. Edema (trace pitting) present.     Left lower leg: No tenderness. No edema.  Skin:    General: Skin is warm and dry.  Neurological:     General: No focal deficit present.     Mental Status: He is alert and oriented to person, place, and time.  Psychiatric:        Mood and Affect: Mood normal.        Behavior: Behavior normal.    Assessment & Plan:  1: Chronic heart failure with mildly reduced ejection fraction- - NYHA class III - euvolemic  - weighing daily and weight log reviewed; has gained 5 pounds at home over the last week; reminded to call for an overnight weight gain of > 2 pounds or a weekly weight gain of > 5 pounds - weight down 14 pounds from last visit here 1 month ago - not adding salt to his food; does get Mom's meals and he's unsure of sodium content of these - reviewed keeping daily fluid intake to 60-64 ounces - on GDMT of jardiance & metoprolol; losartan is on his list but he's unsure if he's taking this or  not - gets some meds from the [place] and some meds from local pharmacy - once meds are confirmed, consider adding SGLT2  - saw cardiology at the [place] earlier this year - ReDs clip reading was 35% - advised  to take 1 extra furosemide today (currently takes 1 daily) - refer to [facility] MD for further evaluation of his AS - BNP 04/25/22 was 791.9 - reports receiving his flu vaccine for this season - PharmD reconciled meds w/ patient  2: HTN- - BP 111/63 - saw PCP @ the [facility] earlier this year - BMP 05/05/22 reviewed and showed sodium 136, potassium 3.8, creatinine 1.02 & GFR >60  3: DM- - A1c 04/19/22 was 6.4%  4: Atrial fibrillation- - currently taking amio, apixaban, clopidogrel & metoprolol  5: Alcohol use- - no further alcohol since his recent admission; was drinking 5 twelve ounce cans of beer daily (before that was drinking 24 cans) - currently smokes 1/2-1 ppd of cigarettes (was smoking 2 ppd) - does use marijuana as he says that he's never been right since his time in [place] - complete cessation of substances was discussed   Patient did not bring his medications nor a list. Each medication was verbally reviewed with the patient and he was encouraged to bring the bottles to every visit to confirm accuracy of list. Emphasized the importance of bring all his meds to every visit every time.    Return in 2 weeks, sooner if needed.

## 2022-05-13 NOTE — Progress Notes (Signed)
ReDS Vest / Clip - 05/13/22 0900       ReDS Vest / Clip   Station Marker C    Ruler Value 30    ReDS Actual Value 35

## 2022-05-14 NOTE — Progress Notes (Signed)
[facility] - HEART FAILURE CLINIC - PHARMACIST COUNSELING NOTE  Guideline-Directed Medical Therapy/Evidence Based Medicine  ACE/ARB/ARNI: Losartan 12.5 mg daily Beta Blocker: Metoprolol succinate 50 mg daily Aldosterone Antagonist:  None Diuretic: Furosemide 20 mg daily as needed SGLT2i: Empagliflozin 10 mg daily  Adherence Assessment  Do you ever forget to take your medication? [x] Yes [] No  Do you ever skip doses due to side effects? [] Yes [x] No  Do you have trouble affording your medicines? [] Yes [x] No  Are you ever unable to pick up your medication due to transportation difficulties? [] Yes [x] No  Do you ever stop taking your medications because you don't believe they are helping? [] Yes [x] No  Do you check your weight daily? [x] Yes [] No   Adherence strategy: Does not use a pill box  Barriers to obtaining medications: None  Vital signs: HR 78, BP 111/63, weight (pounds) 155 ECHO: Date 04/2022, EF 35-40%, notes: severe AS & myxomatous mitral valve     Latest Ref Rng & Units 05/05/2022    5:56 AM 05/04/2022    4:15 AM 05/03/2022    4:10 AM  BMP  Glucose 70 - 99 mg/dL 99  87  99   BUN 8 - 23 mg/dL 16  18  23    Creatinine 0.61 - 1.24 mg/dL     Sodium 135 - 145 mmol/L 136  138  138   Potassium 3.5 - 5.1 mmol/L 3.8  4.1  3.5   Chloride 98 - 111 mmol/L 107  110  111   CO2 22 - 32 mmol/L 22  21  19    Calcium 8.9 - 10.3 mg/dL 9.3  9.1  8.8     Past Medical History:  Diagnosis Date   Asthma    Atrial fibrillation (HCC)    Bicuspid aortic valve    CHF (congestive heart failure) (HCC)    COPD (chronic obstructive pulmonary disease) (HCC)    Depression    Diabetes mellitus without complication (HCC)    Hypertension     ASSESSMENT 67 year old male with PMH Afib, HTN, PVD, T2DM, HLD, NSTEMI x 2 (2023, 2024) who presents to the HF clinic for follow-up. Most recent ECHO in 04/2022 shows EF 35-40%. Regarding GDMT, patient takes Jardiance 10  mg daily, Toprol XL 50 mg daily, and losartan 12.5 mg daily. Patient also takes furosemide 20 mg daily as needed. Patient is not a good historian for his medications as he receives some from local pharmacy and some from the and it is difficult to keep all the medications straight. I encouraged him to please bring his medications to his next appointment.  Recent ED Visit (past 6 months): Date - 03/20/2022, CC - NSTEMI Date - 04/04/2022, CC - Afib Date - 04/19/2022, CC - Afib w/ RVR Date - 04/25/2021, CC - NSTEMI  PLAN CHF/HTN Continue Jardiance, Toprol XL, losartan, and furosemide Continue midodrine Patient was uncertain about this medication but per [facility] records, he is currently taking it Consider adding spironolactone 12.5 mg daily at next appointment once medications are established and if blood pressure will permit  Afib CHADSVASc 5 Continue Eliquis, amiodarone, and Toprol XL  HLD 03/2022 LDL 96 Continue atorvastatin 80 mg daily Note in 03/2022 mentions Zetia and Tricor but I see no record of these medications Recommend addition of Zetia 10 mg daily to regimen if he is not already taking   Time spent: 20 minutes  Will M. , PharmD PGY-1 Pharmacy Resident 05/14/2022 7:28 PM  Current Outpatient Medications:    albuterol (PROVENTIL) (2.5 MG/3ML) 0.083% nebulizer solution, 1 AMPULE BY ORAL INHALATION TWO TIMES A DAY AND 1 AMPULE ONCE EVERY DAY AS NEEDED FOR SHORTNESS OF BREATH, Disp: , Rfl:    albuterol (VENTOLIN HFA) 108 (90 Base) MCG/ACT inhaler, INHALE 2 PUFFS BY ORAL INHALATION EVERY 4 TO 6 HOURS AS NEEDED FOR CHRONIC OBSTRUCTIVE LUNG DISEASE FOR BREATHING. BE SURE TO [place] MOUTHPIECE WITH WARM WATER ONCE A WEEK, Disp: , Rfl:    amiodarone (PACERONE) 200 MG tablet, Take 1 tablet (200 mg total) by mouth daily., Disp: 30 tablet, Rfl: 2   apixaban (ELIQUIS) 5 MG TABS tablet, Take 1 tablet (5 mg total) by mouth 2 (two) times daily., Disp: 60 tablet, Rfl: 0   atorvastatin  (LIPITOR) 80 MG tablet, Take 1 tablet (80 mg total) by mouth daily., Disp: 90 tablet, Rfl: 3   ciclesonide (ALVESCO) 160 MCG/ACT inhaler, Inhale 2 puffs into the lungs 2 (two) times daily., Disp: 1 each, Rfl: 1   clopidogrel (PLAVIX) 75 MG tablet, Take 1 tablet (75 mg total) by mouth daily., Disp: 30 tablet, Rfl: 1   empagliflozin (JARDIANCE) 10 MG TABS tablet, Take 1 tablet (10 mg total) by mouth daily., Disp: 30 tablet, Rfl: 2   fluticasone-salmeterol (ADVAIR) 250-50 MCG/ACT AEPB, Inhale 1 puff into the lungs in the morning and at bedtime., Disp: 1 each, Rfl: 1   furosemide (LASIX) 20 MG tablet, Take 1 tablet (20 mg total) by mouth daily as needed (increasing swelling or weight gain)., Disp: 30 tablet, Rfl: 1   gabapentin (NEURONTIN) 300 MG capsule, Take 1 capsule (300 mg total) by mouth 3 (three) times daily as needed., Disp: 90 capsule, Rfl: 0   losartan (COZAAR) 25 MG tablet, Take 0.5 tablets (12.5 mg total) by mouth daily., Disp: 30 tablet, Rfl: 1   metFORMIN (GLUCOPHAGE) 500 MG tablet, Take 500 mg by mouth 2 (two) times daily with a meal., Disp: , Rfl:    methadone (DOLOPHINE) 10 MG tablet, Take 1 tablet by mouth 2 (two) times daily., Disp: , Rfl:    metoprolol succinate (TOPROL-XL) 50 MG 24 hr tablet, Take 1 tablet (50 mg total) by mouth daily. Take with or immediately following a meal., Disp: 30 tablet, Rfl: 3   midodrine (PROAMATINE) 10 MG tablet, Take 1 tablet (10 mg total) by mouth 3 (three) times daily with meals., Disp: 90 tablet, Rfl: 1   nitroGLYCERIN (NITROSTAT) 0.4 MG SL tablet, Place 1 tablet (0.4 mg total) under the tongue every 5 (five) minutes x 3 doses as needed for chest pain., Disp: 30 tablet, Rfl: 0   oxyCODONE (OXY IR/ROXICODONE) 5 MG immediate release tablet, Take 10 mg by mouth every 4 (four) hours as needed for severe pain., Disp: , Rfl:    thiamine (VITAMIN B-1) 100 MG tablet, Take 1 tablet (100 mg total) by mouth daily. (Patient not taking: Reported on 05/13/2022), Disp:  30 tablet, Rfl: 3   tiotropium (SPIRIVA) 18 MCG inhalation capsule, Place 1 capsule (18 mcg total) into inhaler and inhale daily. (Patient not taking: Reported on 05/13/2022), Disp: 30 capsule, Rfl: 1   Tiotropium Bromide-Olodaterol 2.5-2.5 MCG/ACT AERS, INHALE 2 INHALATIONS BY ORAL INHALATION ONCE EVERY DAY, Disp: , Rfl:    DRUGS TO CAUTION IN HEART FAILURE  Drug or Class Mechanism  Analgesics NSAIDs COX-2 inhibitors Glucocorticoids  Sodium and water retention, increased systemic vascular resistance, decreased response to diuretics   Diabetes Medications Metformin Thiazolidinediones Rosiglitazone (Avandia) Pioglitazone (Actos) DPP4 Inhibitors  Saxagliptin (Onglyza) Sitagliptin (Januvia)   Lactic acidosis Possible calcium channel blockade   Unknown  Antiarrhythmics Class I  Flecainide Disopyramide Class III Sotalol Other Dronedarone  Negative inotrope, proarrhythmic   Proarrhythmic, beta blockade  Negative inotrope  Antihypertensives Alpha Blockers Doxazosin Calcium Channel Blockers Diltiazem Verapamil Nifedipine Central Alpha Adrenergics Moxonidine Peripheral Vasodilators Minoxidil  Increases renin and aldosterone  Negative inotrope    Possible sympathetic withdrawal  Unknown  Anti-infective Itraconazole Amphotericin B  Negative inotrope Unknown  Hematologic Anagrelide Cilostazol   Possible inhibition of PD IV Inhibition of PD III causing arrhythmias  Neurologic/Psychiatric Stimulants Anti-Seizure Drugs Carbamazepine Pregabalin Antidepressants Tricyclics Citalopram Parkinsons Bromocriptine Pergolide Pramipexole Antipsychotics Clozapine Antimigraine Ergotamine Methysergide Appetite suppressants Bipolar Lithium  Peripheral alpha and beta agonist activity  Negative inotrope and chronotrope Calcium channel blockade  Negative inotrope, proarrhythmic Dose-dependent QT prolongation  Excessive serotonin activity/valvular  damage Excessive serotonin activity/valvular damage Unknown  IgE mediated hypersensitivy, calcium channel blockade  Excessive serotonin activity/valvular damage Excessive serotonin activity/valvular damage Valvular damage  Direct myofibrillar degeneration, adrenergic stimulation  Antimalarials Chloroquine Hydroxychloroquine Intracellular inhibition of lysosomal enzymes  Urologic Agents Alpha Blockers Doxazosin Prazosin Tamsulosin Terazosin  Increased renin and aldosterone  Adapted from Page Carleene Overlie, et al. "Drugs That May Cause or Exacerbate Heart Failure: A Scientific Statement from the American Heart  Association." Circulation 2016; 134:e32-e69. DOI: 10.1161/CIR.0000000000000426   MEDICATION ADHERENCES TIPS AND STRATEGIES Taking medication as prescribed improves patient outcomes in heart failure (reduces hospitalizations, improves symptoms, increases survival) Side effects of medications can be managed by decreasing doses, switching agents, stopping drugs, or adding additional therapy. Please let someone in the [facility] know if you have having bothersome side effects so we can modify your regimen. Do not alter your medication regimen without talking to [place].  Medication reminders can help patients remember to take drugs on time. If you are missing or forgetting doses you can try linking behaviors, using pill boxes, or an electronic reminder like an alarm on your phone or an app. Some people can also get automated phone calls as medication reminders.

## 2022-05-15 ENCOUNTER — Telehealth (HOSPITAL_COMMUNITY): Payer: Self-pay | Admitting: *Deleted

## 2022-05-15 NOTE — Telephone Encounter (Signed)
Pt called to report he has been having some chest pain off/on for past couple of days. He states it feels like a pin print and resolves on it's own after just a minute or 2, he feels like HR is jumping around also. Reports BP stable 96/60, 106/68 repeat, HR running 105-115 which is high for him. Advised pt to call his general cardiologist (Dr Humphrey Rolls at [facility]) for further recommendations, ?in afib, he will call their office and verified he had their phone number

## 2022-05-27 ENCOUNTER — Other Ambulatory Visit
Admission: RE | Admit: 2022-05-27 | Discharge: 2022-05-27 | Disposition: A | Discharge status: Home / Self Care | Payer: Medicare PPO | Source: Ambulatory Visit | Attending: Cardiology | Admitting: Cardiology

## 2022-05-27 ENCOUNTER — Encounter: Payer: No Typology Code available for payment source | Admitting: Family

## 2022-05-27 ENCOUNTER — Ambulatory Visit (HOSPITAL_BASED_OUTPATIENT_CLINIC_OR_DEPARTMENT_OTHER): Payer: Medicare PPO | Admitting: Cardiology

## 2022-05-27 ENCOUNTER — Encounter: Payer: Self-pay | Admitting: Cardiology

## 2022-05-27 VITALS — BP 100/60 | HR 73 | Wt 143.0 lb

## 2022-05-27 DIAGNOSIS — I480 Paroxysmal atrial fibrillation: Secondary | ICD-10-CM

## 2022-05-27 DIAGNOSIS — I050 Rheumatic mitral stenosis: Secondary | ICD-10-CM

## 2022-05-27 DIAGNOSIS — I5022 Chronic systolic (congestive) heart failure: Secondary | ICD-10-CM | POA: Diagnosis not present

## 2022-05-27 DIAGNOSIS — I10 Essential (primary) hypertension: Secondary | ICD-10-CM | POA: Diagnosis not present

## 2022-05-27 DIAGNOSIS — I350 Nonrheumatic aortic (valve) stenosis: Secondary | ICD-10-CM

## 2022-05-27 LAB — COMPREHENSIVE METABOLIC PANEL
ALT: 21 U/L (ref 0–44)
AST: 26 U/L (ref 15–41)
Albumin: 4.5 g/dL (ref 3.5–5.0)
Alkaline Phosphatase: 64 U/L (ref 38–126)
Anion gap: 13 (ref 5–15)
BUN: 15 mg/dL (ref 8–23)
CO2: 25 mmol/L (ref 22–32)
Calcium: 9.3 mg/dL (ref 8.9–10.3)
Chloride: 99 mmol/L (ref 98–111)
Creatinine, Ser: 0.93 mg/dL (ref 0.61–1.24)
GFR, Estimated: >60 mL/min (ref >60)
Glucose, Bld: 120 mg/dL — ABNORMAL HIGH (ref 70–99)
Potassium: 3.5 mmol/L (ref 3.5–5.1)
Sodium: 137 mmol/L (ref 135–145)
Total Bilirubin: 1.1 mg/dL (ref 0.3–1.2)
Total Protein: 8.1 g/dL (ref 6.5–8.1)

## 2022-05-27 LAB — BRAIN NATRIURETIC PEPTIDE: B Natriuretic Peptide: 804.1 pg/mL — ABNORMAL HIGH (ref 0.0–100.0)

## 2022-05-27 NOTE — Progress Notes (Signed)
ADVANCED HEART FAILURE CLINIC NOTE  Referring Physician: [facility] [place] Primary Cardiologist:  HPI: Jason Beck is a 67 y.o. male with T2DM, HTN, atrial fibrillation, COPD, bicuspid aortic valve,  hx of [place] s/p radiation, PAD and recently diagnosed HFrEF presenting today to establish care. Jason Beck reports shortness of breath started roughly two years ago, however, has slowly progressed over the past few months. He is currently followed at the [facility] for his COPD, PAD, hx of lung cancer and primary care. In December 2023, he presented to [facility] with acute onset chest pressure and atrial fibrillation with RVR. He was started on amiodarone and underwent LHC demonstrating nonobstructive CAD. In addition, TTE w/ LVEF of 35%-40%. He was ultimately started on GDMT and discharged home.   Today he can go up 1 flight of stairs before having to stop due to shortness of breath. He feels lightheaded roughly once a week that appears to be related to orthostasis. He has chest pain fairly infrequently that is atypical for angina (sharp, lasts few seconds). We reviewed his echocardiogram from his prior hospitalization which demonstrates severe aortic stenosis (hx of bicuspid aortic valve) in addition to at least moderate mitral stenosis. He told me he is motivated to have this repaired.   Activity level/exercise tolerance:  NYHA IIB-III Orthopnea:  Sleeps on 2 pillows Paroxysmal noctural dyspnea:  no Chest pain/pressure:  yes, infrequent Orthostatic lightheadedness:  yes Palpitations:  no Lower extremity edema:  no Presyncope/syncope:  Not recently Cough:  no  Past Medical History:  Diagnosis Date   Asthma    Atrial fibrillation (HCC)    Bicuspid aortic valve    CHF (congestive heart failure) (HCC)    COPD (chronic obstructive pulmonary disease) (HCC)    Depression    Diabetes mellitus without complication (HCC)    Hypertension     Current  Outpatient Medications  Medication Sig Dispense Refill   albuterol (PROVENTIL) (2.5 MG/3ML) 0.083% nebulizer solution 1 AMPULE BY ORAL INHALATION TWO TIMES A DAY AND 1 AMPULE ONCE EVERY DAY AS NEEDED FOR SHORTNESS OF BREATH     albuterol (VENTOLIN HFA) 108 (90 Base) MCG/ACT inhaler INHALE 2 PUFFS BY ORAL INHALATION EVERY 4 TO 6 HOURS AS NEEDED FOR CHRONIC OBSTRUCTIVE LUNG DISEASE FOR BREATHING. BE SURE TO WASH MOUTHPIECE WITH WARM WATER ONCE A WEEK     amiodarone (PACERONE) 200 MG tablet Take 1 tablet (200 mg total) by mouth daily. 30 tablet 2   apixaban (ELIQUIS) 5 MG TABS tablet Take 1 tablet (5 mg total) by mouth 2 (two) times daily. 60 tablet 0   atorvastatin (LIPITOR) 80 MG tablet Take 1 tablet (80 mg total) by mouth daily. 90 tablet 3   clopidogrel (PLAVIX) 75 MG tablet Take 1 tablet (75 mg total) by mouth daily. 30 tablet 1   empagliflozin (JARDIANCE) 10 MG TABS tablet Take 1 tablet (10 mg total) by mouth daily. 30 tablet 2   fluticasone-salmeterol (ADVAIR) 250-50 MCG/ACT AEPB Inhale 1 puff into the lungs in the morning and at bedtime. 1 each 1   furosemide (LASIX) 20 MG tablet Take 1 tablet (20 mg total) by mouth daily as needed (increasing swelling or weight gain). 30 tablet 1   gabapentin (NEURONTIN) 300 MG capsule Take 1 capsule (300 mg total) by mouth 3 (three) times daily as needed. 90 capsule 0   losartan (COZAAR) 25 MG tablet Take 0.5 tablets (12.5 mg total) by mouth daily. [place]  tablet 1   metFORMIN (GLUCOPHAGE) 500 MG tablet Take 500 mg by mouth 2 (two) times daily with a meal.     methadone (DOLOPHINE) 10 MG tablet Take 1 tablet by mouth 2 (two) times daily.     metoprolol succinate (TOPROL-XL) 100 MG 24 hr tablet Take 100 mg by mouth daily.     midodrine (PROAMATINE) 10 MG tablet Take 1 tablet (10 mg total) by mouth 3 (three) times daily with meals. 90 tablet 1   nitroGLYCERIN (NITROSTAT) 0.4 MG SL tablet Place 1 tablet (0.4 mg total) under the tongue every 5 (five) minutes x 3 doses  as needed for chest pain. 30 tablet 0   oxyCODONE (OXY IR/ROXICODONE) 5 MG immediate release tablet Take 10 mg by mouth every 4 (four) hours as needed for severe pain.     thiamine (VITAMIN B-1) 100 MG tablet Take 1 tablet (100 mg total) by mouth daily. 30 tablet 3   tiotropium (SPIRIVA) 18 MCG inhalation capsule Place 1 capsule (18 mcg total) into inhaler and inhale daily. 30 capsule 1   Tiotropium Bromide-Olodaterol 2.5-2.5 MCG/ACT AERS INHALE 2 INHALATIONS BY ORAL INHALATION ONCE EVERY DAY     No current facility-administered medications for this visit.    Allergies  Allergen Reactions   Niacin Other (See Comments)   Penicillin G       Social History   Socioeconomic History   Marital status: Married    Spouse name: Not on file   Number of children: Not on file   Years of education: Not on file   Highest education level: Not on file  Occupational History   Not on file  Tobacco Use   Smoking status: Every Day    Packs/day: 1.50    Types: Cigarettes   Smokeless tobacco: Never  Substance and Sexual Activity   Alcohol use: Yes    Alcohol/week: 2.0 - 3.0 standard drinks of alcohol    Types: 2 - 3 Cans of beer per week    Comment: daily   Drug use: Yes    Types: Marijuana   Sexual activity: Not on file  Other Topics Concern   Not on file  Social History Narrative   Not on file   Social Determinants of Health   Financial Resource Strain: Not on file  Food Insecurity: No Food Insecurity (04/27/2022)   Hunger Vital Sign    Worried About Running Out of Food in the Last Year: Never true    Ran Out of Food in the Last Year: Never true  Transportation Needs: No Transportation Needs (04/27/2022)   PRAPARE - Hydrologist (Medical): No    Lack of Transportation (Non-Medical): No  Physical Activity: Not on file  Stress: Not on file  Social Connections: Not on file  Intimate Partner Violence: Not At Risk (04/27/2022)   Humiliation, Afraid, Rape, and  Kick questionnaire    Fear of Current or Ex-Partner: No    Emotionally Abused: No    Physically Abused: No    Sexually Abused: No     No family history on file.  PHYSICAL EXAM: Vitals:   05/27/22 1036  BP: 100/60  Pulse: 73  SpO2: 99%   GENERAL: Well nourished, well developed, and in no apparent distress at rest.  HEENT: Negative for arcus senilis or xanthelasma. There is no scleral icterus.  The mucous membranes are pink and moist.   NECK: Supple, No masses. Normal carotid upstrokes without bruits. No masses or  thyromegaly.    CHEST: There are no chest wall deformities. There is no chest wall tenderness. Respirations are unlabored.  Lungs- decreased lung sounds at bases; prolonged expiratory phase. No wheezing.  CARDIAC:  JVP: <7 cm H2O         Normal S1, S2  Normal rate with regular rhythm. 4-9/4 systolic murmur at RUSB, rubs or gallops.  Pulses are 2+ and symmetrical in upper and lower extremities. no edema.  ABDOMEN: Soft, non-tender, non-distended. There are no masses or hepatomegaly. There are normal bowel sounds.  EXTREMITIES: Warm and well perfused with no cyanosis, clubbing.  LYMPHATIC: No axillary or supraclavicular lymphadenopathy.  NEUROLOGIC: Patient is oriented x3 with no focal or lateralizing neurologic deficits.  PSYCH: Patients affect is appropriate, there is no evidence of anxiety or depression.  SKIN: Warm and dry; no lesions or wounds.   DATA REVIEW  ECG: 05/01/22: atrial fibrillation w/ RVR  ECHO: 04/27/22: LVEF 35-40%. Severe calcific aortic stenosis. Moderate MS. Dilated LA.   CATH: 04/27/22:    Dist RCA lesion is 70% stenosed.   1st Mrg lesion is 55% stenosed.   Mid LAD lesion is 50% stenosed. Negative iFR of the distal RCA  PFTs: 02/03/2022 FVC 3.55 (88%) FEV1 1.67 (54%) F/F 47 RV 2.94 114% DLCO 14.27   02/21/2019 FVC 4.02 (96%) FEV1 1.4 (39%) F/F 35 FEF 25-75 0.33 10% DLCO 16.3 (70%)  PFTs 06/05/2014: FVC = 3.56 (77%) -> 4.15 (91%)  postbronchodilator FEV1= 1.46 (40%) -> 1.81 (50%) postbronchodilator FEV1 /FVC= 41%   ASSESSMENT & PLAN:  Heart failure with reduced EF Etiology of WH:[id number]; LHC as above w/ nonobstructive CAD. Likely valvular.  NYHA class / AHA Stage:IIB-III Volume status & Diuretics: Euvolemic, lasix 20mg  daily Vasodilators:Losartan 12.5mg  daily; hypertensive in clinic today, however, reports Bps as low as 115 at home. Avoiding excessive hypotension in the setting of underlying AS.  Beta-Blocker:Toprol 100mg  BID MRA:N/A Cardiometabolic:jardiance 10mg  Devices therapies & Valvulopathies: Severe AS, mod MS. See below.  Advanced therapies:Not currently indicated.   2. Severe aortic stenosis  - TTE from 04/27/22 w/ severely calcified bicuspid aortic valve that is well visualized on short axis views. There is also at least moderate aortic regurgitation due to degeneration of the valve leaflets. Gradients are only mild-moderate, however, I believe these are underestimated. He has symptoms of lightheadedness and chest pressure likely secondary to valvular heart disease.  - Mr. Brunton is fairly active but now feels limited due to SOB & chest pressure. Unfortunately, he is higher risk for surgery due to advanced COPD (PFTs above). He may potentially be a candidate for transcatheter repair, however, vascular access may be challenging given hx of PAD.  - Will refer to CTS initially due to underlying mitral valve disease, AS & CAD.   3. Mitral stenosis - Restricted posterior leaflet excursion on TTE appears to be 2/2 degenerative / calcific MS. Will need further imaging for better assessment.  - Plan for TEE in the future.   4. Chronic pain  - Methadone weaned to 5mg . Planning to discontinue completely in the future.   5. PAD - obtaining records from [facility]  6. COPD - PFTs above  7. Atrial fibrillation - Amiodarone - Apixaban  Personally reviewed PFTs, TTE, EKGs and imaging   Willetta [place] Advanced Heart Failure Mechanical Circulatory Support

## 2022-05-27 NOTE — Progress Notes (Deleted)
ADVANCED HEART FAILURE CLINIC NOTE  Referring Physician: [facility] Primary Cardiologist:  HPI: Jason Beck is a 67 y.o. male who presents for initial visit for further evaluation and treatment of heart failure/cardiomyopathy.   Today he reports shortness of breath started roughly two years ago, however, has slowly progressed over the past few months. Today he can go up 1 flight of stairs before having to stop due to shortness of breath. He feels lightheaded roughly once a week that appears to be related to orthostasis. He has chest pain fairly infrequently that is atypical for angina (sharp, lasts few seconds).   Activity level/exercise tolerance:  *** Orthopnea:  Sleeps on *** pillows Paroxysmal noctural dyspnea:  *** Chest pain/pressure:  *** Orthostatic lightheadedness:  *** Palpitations:  *** Lower extremity edema:  *** Presyncope/syncope:  *** Cough:  ***  Past Medical History:  Diagnosis Date   Asthma    Atrial fibrillation (HCC)    Bicuspid aortic valve    CHF (congestive heart failure) (HCC)    COPD (chronic obstructive pulmonary disease) (HCC)    Depression    Diabetes mellitus without complication (HCC)    Hypertension     Current Outpatient Medications  Medication Sig Dispense Refill   albuterol (PROVENTIL) (2.5 MG/3ML) 0.083% nebulizer solution 1 AMPULE BY ORAL INHALATION TWO TIMES A DAY AND 1 AMPULE ONCE EVERY DAY AS NEEDED FOR SHORTNESS OF BREATH     albuterol (VENTOLIN HFA) 108 (90 Base) MCG/ACT inhaler INHALE 2 PUFFS BY ORAL INHALATION EVERY 4 TO 6 HOURS AS NEEDED FOR CHRONIC OBSTRUCTIVE LUNG DISEASE FOR BREATHING. BE SURE TO WASH MOUTHPIECE WITH WARM WATER ONCE A WEEK     amiodarone (PACERONE) 200 MG tablet Take 1 tablet (200 mg total) by mouth daily. 30 tablet 2   apixaban (ELIQUIS) 5 MG TABS tablet Take 1 tablet (5 mg total) by mouth 2 (two) times daily. 60 tablet 0   atorvastatin (LIPITOR) 80  MG tablet Take 1 tablet (80 mg total) by mouth daily. 90 tablet 3   clopidogrel (PLAVIX) 75 MG tablet Take 1 tablet (75 mg total) by mouth daily. 30 tablet 1   empagliflozin (JARDIANCE) 10 MG TABS tablet Take 1 tablet (10 mg total) by mouth daily. 30 tablet 2   fluticasone-salmeterol (ADVAIR) 250-50 MCG/ACT AEPB Inhale 1 puff into the lungs in the morning and at bedtime. 1 each 1   furosemide (LASIX) 20 MG tablet Take 1 tablet (20 mg total) by mouth daily as needed (increasing swelling or weight gain). 30 tablet 1   gabapentin (NEURONTIN) 300 MG capsule Take 1 capsule (300 mg total) by mouth 3 (three) times daily as needed. 90 capsule 0   losartan (COZAAR) 25 MG tablet Take 0.5 tablets (12.5 mg total) by mouth daily. 30 tablet 1   metFORMIN (GLUCOPHAGE) 500 MG tablet Take 500 mg by mouth 2 (two) times daily with a meal.     methadone (DOLOPHINE) 10 MG tablet Take 1 tablet by mouth 2 (two) times daily.     metoprolol succinate (TOPROL-XL) 100 MG 24 hr tablet Take 100 mg by mouth daily.     midodrine (PROAMATINE) 10 MG tablet Take 1 tablet (10 mg total) by mouth 3 (three) times daily with meals. 90 tablet 1   nitroGLYCERIN (NITROSTAT) 0.4 MG SL tablet Place 1 tablet (0.4 mg total) under the tongue every 5 (five) minutes x 3 doses as needed for chest pain. 30 tablet  0   oxyCODONE (OXY IR/ROXICODONE) 5 MG immediate release tablet Take 10 mg by mouth every 4 (four) hours as needed for severe pain.     thiamine (VITAMIN B-1) 100 MG tablet Take 1 tablet (100 mg total) by mouth daily. 30 tablet 3   tiotropium (SPIRIVA) 18 MCG inhalation capsule Place 1 capsule (18 mcg total) into inhaler and inhale daily. 30 capsule 1   Tiotropium Bromide-Olodaterol 2.5-2.5 MCG/ACT AERS INHALE 2 INHALATIONS BY ORAL INHALATION ONCE EVERY DAY     No current facility-administered medications for this visit.    Allergies  Allergen Reactions   Niacin Other (See Comments)   Penicillin G       Social History    Socioeconomic History   Marital status: Married    Spouse name: Not on file   Number of children: Not on file   Years of education: Not on file   Highest education level: Not on file  Occupational History   Not on file  Tobacco Use   Smoking status: Every Day    Packs/day: 1.50    Types: Cigarettes   Smokeless tobacco: Never  Substance and Sexual Activity   Alcohol use: Yes    Alcohol/week: 2.0 - 3.0 standard drinks of alcohol    Types: 2 - 3 Cans of beer per week    Comment: daily   Drug use: Yes    Types: Marijuana   Sexual activity: Not on file  Other Topics Concern   Not on file  Social History Narrative   Not on file   Social Determinants of Health   Financial Resource Strain: Not on file  Food Insecurity: No Food Insecurity (04/27/2022)   Hunger Vital Sign    Worried About Running Out of Food in the Last Year: Never true    Ran Out of Food in the Last Year: Never true  Transportation Needs: No Transportation Needs (04/27/2022)   PRAPARE - Hydrologist (Medical): No    Lack of Transportation (Non-Medical): No  Physical Activity: Not on file  Stress: Not on file  Social Connections: Not on file  Intimate Partner Violence: Not At Risk (04/27/2022)   Humiliation, Afraid, Rape, and Kick questionnaire    Fear of Current or Ex-Partner: No    Emotionally Abused: No    Physically Abused: No    Sexually Abused: No     No family history on file.  PHYSICAL EXAM: Vitals:   05/27/22 1036  BP: 100/60  Pulse: 73  SpO2: 99%   GENERAL: Well nourished, well developed, and in no apparent distress at rest.  HEENT: Negative for arcus senilis or xanthelasma. There is no scleral icterus.  The mucous membranes are pink and moist.   NECK: Supple, No masses. Normal carotid upstrokes without bruits. No masses or thyromegaly.    CHEST: There are no chest wall deformities. There is no chest wall tenderness. Respirations are unlabored.  Lungs-  *** CARDIAC:  JVP: *** cm H2O         {HEART SOUNDS:22645}  Normal rate with regular rhythm. No murmurs, rubs or gallops.  Pulses are 2+ and symmetrical in upper and lower extremities. *** edema.  ABDOMEN: Soft, non-tender, non-distended. There are no masses or hepatomegaly. There are normal bowel sounds.  EXTREMITIES: Warm and well perfused with no cyanosis, clubbing.  LYMPHATIC: No axillary or supraclavicular lymphadenopathy.  NEUROLOGIC: Patient is oriented x3 with no focal or lateralizing neurologic deficits.  PSYCH: Patients affect is  appropriate, there is no evidence of anxiety or depression.  SKIN: Warm and dry; no lesions or wounds.   DATA REVIEW  ECG:***  ECHO:***  CATH:***   ASSESSMENT & PLAN:  *** Etiology of HF:*** NYHA class / AHA Stage:*** Volume status & Diuretics: *** Vasodilators:*** Beta-Blocker:*** MRA:*** Cardiometabolic:*** Devices therapies & Valvulopathies:*** Advanced therapies:***  Follow-up:  No follow-ups on file.   Albaraa Swingle Advanced Heart Failure Mechanical Circulatory Support

## 2022-05-27 NOTE — Patient Instructions (Addendum)
Medication Changes:  No medication changes.  Lab Work:  Labs done today, your results will be available in MyChart, we will contact you for abnormal readings.   Testing/Procedures:  Your physician has recommended that you have a pulmonary function test. Pulmonary Function Tests are a group of tests that measure how well air moves in and out of your lungs.   Please call our office in two weeks  to schedule.   Referrals: You have been referred to Cardiothoracic services.  They will contact you and schedule an appointment.   Follow-Up in: You have a follow up appointment with Dr. Daniel Nones    If you have any questions or concerns before your next appointment please send [place] a message through [facility] or call our office at [phone] Monday-Friday 8 am-5 pm.   If you have an urgent need after hours on the weekend please call your Primary Cardiologist or the [facility] in [place] at [phone].

## 2022-06-09 ENCOUNTER — Telehealth: Payer: Self-pay | Admitting: Family

## 2022-06-09 ENCOUNTER — Ambulatory Visit (INDEPENDENT_AMBULATORY_CARE_PROVIDER_SITE_OTHER): Payer: Medicare PPO | Admitting: Cardiovascular Disease

## 2022-06-09 ENCOUNTER — Encounter: Payer: Self-pay | Admitting: Cardiovascular Disease

## 2022-06-09 VITALS — BP 100/70 | HR 81 | Ht 70.0 in | Wt 148.4 lb

## 2022-06-09 DIAGNOSIS — I10 Essential (primary) hypertension: Secondary | ICD-10-CM | POA: Diagnosis not present

## 2022-06-09 DIAGNOSIS — I5043 Acute on chronic combined systolic (congestive) and diastolic (congestive) heart failure: Secondary | ICD-10-CM

## 2022-06-09 DIAGNOSIS — I350 Nonrheumatic aortic (valve) stenosis: Secondary | ICD-10-CM

## 2022-06-09 DIAGNOSIS — I482 Chronic atrial fibrillation, unspecified: Secondary | ICD-10-CM | POA: Diagnosis not present

## 2022-06-09 DIAGNOSIS — I739 Peripheral vascular disease, unspecified: Secondary | ICD-10-CM

## 2022-06-09 DIAGNOSIS — E785 Hyperlipidemia, unspecified: Secondary | ICD-10-CM

## 2022-06-09 DIAGNOSIS — I480 Paroxysmal atrial fibrillation: Secondary | ICD-10-CM

## 2022-06-09 NOTE — Telephone Encounter (Signed)
Called and notified patient we got his pulmonary function test scheduled for march 12th at 1pm. Patient states he might have a conflict as he is fighting with the [place] and will call us back if needed.   Lena Gores, NT

## 2022-06-09 NOTE — Assessment & Plan Note (Signed)
Patient lower extremity edema improved. No changes.

## 2022-06-09 NOTE — Progress Notes (Signed)
Cardiology Office Note   Date:  06/09/2022   ID:  Jason Beck 09-24-1955, MRN [id number]  PCP:  [facility] [place]  Cardiologist:  Neoma Laming, MD      History of Present Illness: Jason Beck is a 67 y.o. male who presents for  Chief Complaint  Patient presents with   Follow-up    1 month follow up    Patient in office for routine cardiac exam. Denies chest pain, shortness of breath, edema, palpitations. Patient reports edema improved since elevating leg.       Past Medical History:  Diagnosis Date   Asthma    Atrial fibrillation (HCC)    Bicuspid aortic valve    CHF (congestive heart failure) (HCC)    COPD (chronic obstructive pulmonary disease) ([place])    Depression    Diabetes mellitus without complication ([place])    Hypertension      Past Surgical History:  Procedure Laterality Date   INTRAVASCULAR PRESSURE WIRE/FFR STUDY N/A 04/27/2022   Procedure: INTRAVASCULAR PRESSURE WIRE/FFR STUDY;  Surgeon: Wellington Hampshire, MD;  Location: [facility] [facility];  Service: Cardiovascular;  Laterality: N/A;   LEFT HEART CATH AND CORONARY ANGIOGRAPHY N/A 04/27/2022   Procedure: LEFT HEART CATH AND CORONARY ANGIOGRAPHY with  possible coronary intervention;  Surgeon: Dionisio Filippo, MD;  Location: [facility];  Service: Cardiovascular;  Laterality: N/A;   toe amputations       Current Outpatient Medications  Medication Sig Dispense Refill   albuterol (PROVENTIL) (2.5 MG/3ML) 0.083% nebulizer solution 1 AMPULE BY ORAL INHALATION TWO TIMES A DAY AND 1 AMPULE ONCE EVERY DAY AS NEEDED FOR SHORTNESS OF BREATH     albuterol (VENTOLIN HFA) 108 (90 Base) MCG/ACT inhaler INHALE 2 PUFFS BY ORAL INHALATION EVERY 4 TO 6 HOURS AS NEEDED FOR CHRONIC OBSTRUCTIVE LUNG DISEASE FOR BREATHING. BE SURE TO WASH MOUTHPIECE WITH WARM WATER ONCE A WEEK     amiodarone (PACERONE) 200 MG tablet Take 1 tablet (200 mg total) by mouth daily. 30 tablet 2   atorvastatin  (LIPITOR) 80 MG tablet Take 1 tablet (80 mg total) by mouth daily. 90 tablet 3   clopidogrel (PLAVIX) 75 MG tablet Take 1 tablet (75 mg total) by mouth daily. 30 tablet 1   empagliflozin (JARDIANCE) 10 MG TABS tablet Take 1 tablet (10 mg total) by mouth daily. 30 tablet 2   fluticasone-salmeterol (ADVAIR) 250-50 MCG/ACT AEPB Inhale 1 puff into the lungs in the morning and at bedtime. 1 each 1   furosemide (LASIX) 20 MG tablet Take 1 tablet (20 mg total) by mouth daily as needed (increasing swelling or weight gain). 30 tablet 1   losartan (COZAAR) 25 MG tablet Take 0.5 tablets (12.5 mg total) by mouth daily. 30 tablet 1   metFORMIN (GLUCOPHAGE) 500 MG tablet Take 500 mg by mouth 2 (two) times daily with a meal.     methadone (DOLOPHINE) 10 MG tablet Take 1 tablet by mouth 2 (two) times daily.     metoprolol succinate (TOPROL-XL) 100 MG 24 hr tablet Take 100 mg by mouth daily.     midodrine (PROAMATINE) 10 MG tablet Take 1 tablet (10 mg total) by mouth 3 (three) times daily with meals. 90 tablet 1   nitroGLYCERIN (NITROSTAT) 0.4 MG SL tablet Place 1 tablet (0.4 mg total) under the tongue every 5 (five) minutes x 3 doses as needed for chest pain. 30 tablet 0   oxyCODONE (OXY IR/ROXICODONE) 5  MG immediate release tablet Take 10 mg by mouth every 4 (four) hours as needed for severe pain.     thiamine (VITAMIN B-1) 100 MG tablet Take 1 tablet (100 mg total) by mouth daily. 30 tablet 3   Tiotropium Bromide-Olodaterol 2.5-2.5 MCG/ACT AERS INHALE 2 INHALATIONS BY ORAL INHALATION ONCE EVERY DAY     apixaban (ELIQUIS) 5 MG TABS tablet Take 1 tablet (5 mg total) by mouth 2 (two) times daily. 60 tablet 0   gabapentin (NEURONTIN) 300 MG capsule Take 1 capsule (300 mg total) by mouth 3 (three) times daily as needed. 90 capsule 0   tiotropium (SPIRIVA) 18 MCG inhalation capsule Place 1 capsule (18 mcg total) into inhaler and inhale daily. 30 capsule 1   No current facility-administered medications for this visit.     Allergies:   Niacin and Penicillin g    Social History:   reports that he has been smoking cigarettes. He has been smoking an average of 1.5 packs per day. He has never used smokeless tobacco. He reports current alcohol use of about 2.0 - 3.0 standard drinks of alcohol per week. He reports current drug use. Drug: Marijuana.   Family History:  family history is not on file.    ROS:     Review of Systems  Constitutional: Negative.   HENT: Negative.    Eyes: Negative.   Respiratory: Negative.    Gastrointestinal: Negative.   Genitourinary: Negative.   Musculoskeletal: Negative.   Skin: Negative.   Neurological: Negative.   Endo/Heme/Allergies: Negative.   Psychiatric/Behavioral: Negative.    All other systems reviewed and are negative.     All other systems are reviewed and negative.    PHYSICAL EXAM: VS:  BP 100/70   Pulse 81   Ht 5' 10"$  (1.778 m)   Wt 148 lb 6.4 oz (67.3 kg)   SpO2 100%   BMI 21.29 kg/m  , BMI Body mass index is 21.29 kg/m. Last weight:  Wt Readings from Last 3 Encounters:  06/09/22 148 lb 6.4 oz (67.3 kg)  05/27/22 143 lb (64.9 kg)  05/13/22 155 lb 2 oz (70.4 kg)     Physical Exam Vitals reviewed.  Constitutional:      Appearance: Normal appearance. He is normal weight.  HENT:     Head: Normocephalic.     Nose: Nose normal.     Mouth/Throat:     Mouth: Mucous membranes are moist.  Eyes:     Pupils: Pupils are equal, round, and reactive to light.  Cardiovascular:     Rate and Rhythm: Normal rate and regular rhythm.     Pulses: Normal pulses.     Heart sounds: Normal heart sounds.  Pulmonary:     Effort: Pulmonary effort is normal.  Abdominal:     General: Abdomen is flat. Bowel sounds are normal.  Musculoskeletal:        General: Normal range of motion.     Cervical back: Normal range of motion.  Skin:    General: Skin is warm.  Neurological:     General: No focal deficit present.     Mental Status: He is alert.   Psychiatric:        Mood and Affect: Mood normal.      EKG: none today  Recent Labs: 04/19/2022: TSH 1.545 05/04/2022: Hemoglobin 11.2; Magnesium 2.3; Platelets 260 05/27/2022: ALT 21; B Natriuretic Peptide 804.1; BUN 15; Creatinine, Ser 0.93; Potassium 3.5; Sodium 137    Lipid Panel  Component Value Date/Time   CHOL 151 03/21/2022 0442   TRIG 82 03/21/2022 0442   HDL 39 (L) 03/21/2022 0442   CHOLHDL 3.9 03/21/2022 0442   VLDL 16 03/21/2022 0442   LDLCALC 96 03/21/2022 0442     ASSESSMENT AND PLAN:    ICD-10-CM   1. Acute on chronic combined systolic and diastolic CHF (congestive heart failure) (HCC)  I50.43     2. Nonrheumatic aortic valve stenosis  I35.0     3. Chronic atrial fibrillation (HCC)  I48.20     4. Essential hypertension  I10     5. Paroxysmal atrial fibrillation with RVR (HCC)  I48.0     6. PVD (peripheral vascular disease) (HCC)  I73.9     7. Dyslipidemia  E78.5        Problem List Items Addressed This Visit       Cardiovascular and Mediastinum   Paroxysmal atrial fibrillation with RVR (HCC)   Acute on chronic combined systolic and diastolic CHF (congestive heart failure) ([place]) - Primary    Patient lower extremity edema improved. No changes.       Essential hypertension   PVD (peripheral vascular disease) (HCC)   Chronic atrial fibrillation (HCC)   Aortic valve stenosis     Other   Dyslipidemia     Disposition:   Return in about 3 months (around 09/07/2022).    Total time spent: 30 minutes  Signed,  Neoma Laming, MD  06/09/2022 1:18 [place]

## 2022-06-09 NOTE — Telephone Encounter (Signed)
Error

## 2022-06-13 ENCOUNTER — Emergency Department
Admission: EM | Admit: 2022-06-13 | Discharge: 2022-06-14 | Disposition: A | Discharge status: Home / Self Care | Payer: No Typology Code available for payment source | Attending: Emergency Medicine | Admitting: Emergency Medicine

## 2022-06-13 ENCOUNTER — Emergency Department: Payer: No Typology Code available for payment source

## 2022-06-13 ENCOUNTER — Other Ambulatory Visit: Payer: Self-pay

## 2022-06-13 DIAGNOSIS — J449 Chronic obstructive pulmonary disease, unspecified: Secondary | ICD-10-CM | POA: Insufficient documentation

## 2022-06-13 DIAGNOSIS — R0602 Shortness of breath: Secondary | ICD-10-CM | POA: Diagnosis present

## 2022-06-13 DIAGNOSIS — I509 Heart failure, unspecified: Secondary | ICD-10-CM | POA: Diagnosis not present

## 2022-06-13 DIAGNOSIS — Z7901 Long term (current) use of anticoagulants: Secondary | ICD-10-CM | POA: Insufficient documentation

## 2022-06-13 NOTE — ED Triage Notes (Signed)
Pt from home to [facility] via [place] with c/o SOB x4hrs. Pt has a hx of COPD and prior admissions for SOB.

## 2022-06-13 NOTE — ED Provider Notes (Signed)
[facility] Provider Note    Event Date/Time   First MD Initiated Contact with Patient 06/13/22 2305     (approximate)   History   Shortness of Breath   HPI  Jason Beck is a 67 y.o. male whose medical history notably includes both COPD and CHF, as well as chronic A-fib on Eliquis.  He presents for evaluation of gradually worsening shortness of breath over the last several days.  He said that it occurs with exertion but also notably occurs when he lies down flat which is unusual for him.  He said it does not feel like when he has been told in the past that he is having COPD exacerbation.  He has not been wheezing.  He denies any pain including both chest and abdominal pain.  No recent fever, no sore throat, no cough.  He said he has been compliant with his medications including his furosemide.  He states that he went to see his cardiologist about a week ago.     Physical Exam   Triage Vital Signs: ED Triage Vitals  Enc Vitals Group     BP 06/13/22 2246 (!) 146/98     Pulse Rate 06/13/22 2246 95     Resp 06/13/22 2246 20     Temp 06/13/22 2246 97.7 F (36.5 C)     Temp Source 06/13/22 2246 Oral     SpO2 06/13/22 2246 95 %     Weight 06/13/22 2250 67.3 kg (148 lb 5.9 oz)     Height 06/13/22 2250 1.778 m ('5\' 10"'$ )     Head Circumference --      Peak Flow --      Pain Score 06/13/22 2250 0     Pain Loc --      Pain Edu? --      Excl. in [place]? --     Most recent vital signs: Vitals:   06/13/22 2300 06/14/22 0139  BP: 126/85 (!) 157/81  Pulse: (!) 143 90  Resp: (!) 28 18  Temp:  98.2 F (36.8 C)  SpO2: 93% 93%     General: Awake, no obvious distress. CV:  Good peripheral perfusion.  Irregular rhythm, normal rate. Resp:  Normal effort. Speaking easily and comfortably, no accessory muscle usage nor intercostal retractions.  Lungs are clear to auscultation at this time.  No wheezing, rales, nor rhonchi.  Mild tachypnea at rest but not in  significant distress. Abd:  No distention.  No tenderness to palpation. Other:  No peripheral edema   ED Results / Procedures / Treatments   Labs (all labs ordered are listed, but only abnormal results are displayed) Labs Reviewed  BRAIN NATRIURETIC PEPTIDE - Abnormal; Notable for the following components:      Result Value   B Natriuretic Peptide 1,533.9 (*)    All other components within normal limits  CBC WITH DIFFERENTIAL/PLATELET - Abnormal; Notable for the following components:   WBC 11.1 (*)    Hemoglobin 12.0 (*)    HCT 38.8 (*)    RDW 21.1 (*)    Monocytes Absolute 1.1 (*)    All other components within normal limits  BASIC METABOLIC PANEL - Abnormal; Notable for the following components:   Sodium 133 (*)    CO2 21 (*)    Glucose, Bld 116 (*)    All other components within normal limits  TROPONIN I (HIGH SENSITIVITY)     EKG  ED ECG REPORT I, Hinda Kehr,  the attending physician, personally viewed and interpreted this ECG.  Date: 06/13/2022 EKG Time: 22: 48 Rate: 99 Rhythm: Atrial fibrillation QRS Axis: normal Intervals: Hold due to A-fib, also slightly prolonged QTc at 500 ms ST/T Wave abnormalities: Non-specific ST segment / T-wave changes, but no clear evidence of acute ischemia. Narrative Interpretation: no definitive evidence of acute ischemia; does not meet STEMI criteria.    RADIOLOGY I viewed and interpreted the patient's 1 view chest x-ray.  There is some generalized haziness consistent with pulmonary vascular congestion but no focal pneumonia.  Radiologist report agrees with this interpretation of mild pulmonary vascular congestion without overt pulmonary edema.    PROCEDURES:  Critical Care performed: No  Procedures   MEDICATIONS ORDERED IN ED: Medications  furosemide (LASIX) injection 20 mg (20 mg Intravenous Given 06/14/22 0141)     IMPRESSION / MDM / ASSESSMENT AND PLAN / ED COURSE  I reviewed the triage vital signs and the  nursing notes.                              Differential diagnosis includes, but is not limited to, CHF exacerbation, COPD exacerbation, pulmonary edema, respiratory viral illness, community-acquired pneumonia, ACS, PE.  Patient's presentation is most consistent with exacerbation of chronic illness.  Labs/studies ordered: BMP, BNP, CBC with differential, high-sensitivity troponin, 1 view chest x-ray, EKG Interventions/Medications given: Furosemide 20 mg IV [facility] Course my include additional interventions not listed in this section:)  Presentation is most consistent with mild CHF exacerbation with slight volume overload.  Labs pending but chest x-ray suggestive of mild pulmonary vascular congestion.  I ordered furosemide 20 mg IV to be in the diuresis and awaiting the results of the lab test.  Low suspicion for ACS but I will check 1 high-sensitivity troponin to see if it is elevated.  Patient will likely be appropriate for discharge and outpatient follow-up at the CHF clinic.  The patient is on the cardiac monitor to evaluate for evidence of arrhythmia and/or significant heart rate changes.   Clinical Course as of 06/14/22 0200  Sun Jun 14, 2022  0040 BMP is essentially normal and CBC with differential is also essentially normal with no evidence of substantial leukocytosis nor anemia.  High-sensitivity troponin is within normal limits and there is no indication to repeat given no recent chest pain.  Awaiting BNP results. [CF]  0104 B Natriuretic Peptide(!): 1,533.9 Substantially elevated BNP, consistent with mild CHF exacerbation. [CF]  0105 No hypoxemia, stable breathing, no indication for admission. [CF]  [id number] I reassessed the patient.  His heart rate ranges between about 80 and 100.  He said he is feeling better.  When his bed was reclined back little bit further he became short of breath again but sitting up at about a 45 degree angle he feels okay.  His oxygen saturation has remained  about 95%.  I reassessed his lungs again and he has no wheezing and is moving good air.  I explained to the patient that he is having a mild CHF exacerbation and that I had ordered furosemide 20 mg IV which should promote diuresis.  I offered hospitalization for additional management if he feels that he is not safe to go home, and as an alternative, I suggest that he could double up on his furosemide over the course of the next day or 2 and follow-up at the next available opportunity at the heart failure clinic.  He said  that he would prefer to go home and follow-up as an outpatient and come back if he gets worse.  I went over the risks and benefits with him twice and again offered hospitalization but he prefers to go home which I think is appropriate under the circumstances.  I gave him appropriate discharge instructions and recommendations and he agrees with the plan. [CF]    Clinical Course User Index [CF] Hinda Kehr, MD     FINAL CLINICAL IMPRESSION(S) / ED DIAGNOSES   Final diagnoses:  Acute on chronic congestive heart failure, unspecified heart failure type ([place])     Rx / DC Orders   ED Discharge Orders          Ordered    AMB referral to CHF clinic       Comments: Mild volume overload with orthopnea and pulmonary vascular congestion of CXR.  Takes furosemide 20 mg daily, will double his dose until he can be seen in clinic.   06/14/22 0159             Note:  This document was prepared using Dragon voice recognition software and may include unintentional dictation errors.   Hinda Kehr, MD 06/14/22 [phone]

## 2022-06-14 LAB — BASIC METABOLIC PANEL
Anion gap: 10 (ref 5–15)
BUN: 17 mg/dL (ref 8–23)
CO2: 21 mmol/L — ABNORMAL LOW (ref 22–32)
Calcium: 9.2 mg/dL (ref 8.9–10.3)
Chloride: 102 mmol/L (ref 98–111)
Creatinine, Ser: 0.91 mg/dL (ref 0.61–1.24)
GFR, Estimated: >60 mL/min (ref >60)
Glucose, Bld: 116 mg/dL — ABNORMAL HIGH (ref 70–99)
Potassium: 4.4 mmol/L (ref 3.5–5.1)
Sodium: 133 mmol/L — ABNORMAL LOW (ref 135–145)

## 2022-06-14 LAB — CBC WITH DIFFERENTIAL/PLATELET
Abs Immature Granulocytes: 0.06 10*3/uL (ref 0.00–0.07)
Basophils Absolute: 0.1 10*3/uL (ref 0.0–0.1)
Basophils Relative: 1 %
Eosinophils Absolute: 0.1 10*3/uL (ref 0.0–0.5)
Eosinophils Relative: 0 %
HCT: 38.8 % — ABNORMAL LOW (ref 39.0–52.0)
Hemoglobin: 12 g/dL — ABNORMAL LOW (ref 13.0–17.0)
Immature Granulocytes: 1 %
Lymphocytes Relative: 25 %
Lymphs Abs: 2.7 10*3/uL (ref 0.7–4.0)
MCH: 26.1 pg (ref 26.0–34.0)
MCHC: 30.9 g/dL (ref 30.0–36.0)
MCV: 84.3 fL (ref 80.0–100.0)
Monocytes Absolute: 1.1 10*3/uL — ABNORMAL HIGH (ref 0.1–1.0)
Monocytes Relative: 10 %
Neutro Abs: 7.1 10*3/uL (ref 1.7–7.7)
Neutrophils Relative %: 63 %
Platelets: 337 10*3/uL (ref 150–400)
RBC: 4.6 MIL/uL (ref 4.22–5.81)
RDW: 21.1 % — ABNORMAL HIGH (ref 11.5–15.5)
WBC: 11.1 10*3/uL — ABNORMAL HIGH (ref 4.0–10.5)
nRBC: 0 % (ref 0.0–0.2)

## 2022-06-14 LAB — BRAIN NATRIURETIC PEPTIDE: B Natriuretic Peptide: 1533.9 pg/mL — ABNORMAL HIGH (ref 0.0–100.0)

## 2022-06-14 LAB — TROPONIN I (HIGH SENSITIVITY): Troponin I (High Sensitivity): 15 ng/L (ref <18)

## 2022-06-14 MED ORDER — FUROSEMIDE 10 MG/ML IJ SOLN
20.0000 mg | Freq: Once | INTRAMUSCULAR | Status: AC
  Administered 2022-06-14: 20 mg via INTRAVENOUS
  Filled 2022-06-14: qty 4

## 2022-06-14 NOTE — Discharge Instructions (Signed)
As we discussed, you have a little bit too much fluid in your lungs which is what is making you short of breath.  We recommend that you take an extra dose of your furosemide daily and follow-up at the next available opportunity with Jason Beck at the [facility] heart failure clinic.  They should reach out to you, hopefully on Monday, to schedule a follow-up appointment.  It is important that you follow-up as soon as possible.  Return immediately to the emergency department if you develop new or worsening symptoms that concern you.

## 2022-06-15 ENCOUNTER — Encounter: Payer: No Typology Code available for payment source | Admitting: Thoracic Surgery (Cardiothoracic Vascular Surgery)

## 2022-06-15 ENCOUNTER — Encounter: Payer: Self-pay | Admitting: Cardiovascular Disease

## 2022-06-15 ENCOUNTER — Other Ambulatory Visit: Payer: Self-pay

## 2022-06-15 ENCOUNTER — Ambulatory Visit (INDEPENDENT_AMBULATORY_CARE_PROVIDER_SITE_OTHER): Payer: Medicare PPO | Admitting: Cardiovascular Disease

## 2022-06-15 VITALS — BP 120/70 | HR 123 | Ht 70.0 in | Wt 141.8 lb

## 2022-06-15 DIAGNOSIS — I214 Non-ST elevation (NSTEMI) myocardial infarction: Secondary | ICD-10-CM

## 2022-06-15 DIAGNOSIS — I060 Rheumatic aortic stenosis: Secondary | ICD-10-CM

## 2022-06-15 DIAGNOSIS — I482 Chronic atrial fibrillation, unspecified: Secondary | ICD-10-CM | POA: Diagnosis not present

## 2022-06-15 DIAGNOSIS — I10 Essential (primary) hypertension: Secondary | ICD-10-CM

## 2022-06-15 DIAGNOSIS — R0789 Other chest pain: Secondary | ICD-10-CM

## 2022-06-15 DIAGNOSIS — I5021 Acute systolic (congestive) heart failure: Secondary | ICD-10-CM

## 2022-06-15 DIAGNOSIS — R0602 Shortness of breath: Secondary | ICD-10-CM

## 2022-06-15 DIAGNOSIS — I5033 Acute on chronic diastolic (congestive) heart failure: Secondary | ICD-10-CM

## 2022-06-15 DIAGNOSIS — E119 Type 2 diabetes mellitus without complications: Secondary | ICD-10-CM

## 2022-06-15 MED ORDER — POTASSIUM CHLORIDE CRYS ER 20 MEQ PO TBCR: 20.0000 meq | EXTENDED_RELEASE_TABLET | Freq: Every day | ORAL | Status: DC

## 2022-06-15 MED ORDER — FUROSEMIDE 40 MG PO TABS: 40.0000 mg | ORAL_TABLET | Freq: Two times a day (BID) | ORAL | 11 refills | Status: DC

## 2022-06-15 MED ORDER — APIXABAN 5 MG PO TABS: 5.0000 mg | ORAL_TABLET | Freq: Two times a day (BID) | ORAL | 0 refills | Status: DC

## 2022-06-15 MED ORDER — EMPAGLIFLOZIN 10 MG PO TABS: 10.0000 mg | ORAL_TABLET | Freq: Every day | ORAL | 2 refills | Status: DC

## 2022-06-15 MED ORDER — POTASSIUM CHLORIDE CRYS ER 20 MEQ PO TBCR: 20.0000 meq | EXTENDED_RELEASE_TABLET | Freq: Every day | ORAL | 1 refills | Status: AC

## 2022-06-15 NOTE — Progress Notes (Unsigned)
Cardiology Office Note   Date:  06/15/2022   ID:  Jason Beck, DOB September 29, 1955, MRN [id number]  PCP:  Beck, Williams  Cardiologist:  Neoma Laming, MD      History of Present Illness: Jason Beck is a 67 y.o. male who presents for  Chief Complaint  Patient presents with   Jason Beck follow up    [facility] to [facility] by EMS overnight and doubled lasix. Was told to come see me.  Shortness of Breath This is a new problem. The current episode started yesterday. The problem occurs constantly. The problem has been rapidly worsening. Associated symptoms include chest pain.  Chest Pain  This is a new problem. The current episode started in the past 7 days. The quality of the pain is described as tightness. Associated symptoms include shortness of breath.      Past Medical History:  Diagnosis Date   Asthma    Atrial fibrillation (HCC)    Bicuspid aortic valve    CHF (congestive heart failure) (HCC)    COPD (chronic obstructive pulmonary disease) ([place])    Depression    Diabetes mellitus without complication ([place])    Hypertension      Past Surgical History:  Procedure Laterality Date   INTRAVASCULAR PRESSURE WIRE/FFR STUDY N/A 04/27/2022   Procedure: INTRAVASCULAR PRESSURE WIRE/FFR STUDY;  Surgeon: Wellington Hampshire, MD;  Location: [facility];  Service: Cardiovascular;  Laterality: N/A;   LEFT HEART CATH AND CORONARY ANGIOGRAPHY N/A 04/27/2022   Procedure: LEFT HEART CATH AND CORONARY ANGIOGRAPHY with  possible coronary intervention;  Surgeon: Dionisio Mete, MD;  Location: [facility];  Service: Cardiovascular;  Laterality: N/A;   toe amputations       Current Outpatient Medications  Medication Sig Dispense Refill   albuterol (PROVENTIL) (2.5 MG/3ML) 0.083% nebulizer solution 1 AMPULE BY ORAL INHALATION TWO TIMES A DAY AND 1 AMPULE ONCE EVERY DAY AS NEEDED FOR SHORTNESS OF BREATH     albuterol (VENTOLIN HFA) 108 (90 Base) MCG/ACT  inhaler INHALE 2 PUFFS BY ORAL INHALATION EVERY 4 TO 6 HOURS AS NEEDED FOR CHRONIC OBSTRUCTIVE LUNG DISEASE FOR BREATHING. BE SURE TO WASH MOUTHPIECE WITH WARM WATER ONCE A WEEK     amiodarone (PACERONE) 200 MG tablet Take 1 tablet (200 mg total) by mouth daily. 30 tablet 2   atorvastatin (LIPITOR) 80 MG tablet Take 1 tablet (80 mg total) by mouth daily. 90 tablet 3   clopidogrel (PLAVIX) 75 MG tablet Take 1 tablet (75 mg total) by mouth daily. 30 tablet 1   empagliflozin (JARDIANCE) 10 MG TABS tablet Take 1 tablet (10 mg total) by mouth daily. 30 tablet 2   fluticasone-salmeterol (ADVAIR) 250-50 MCG/ACT AEPB Inhale 1 puff into the lungs in the morning and at bedtime. 1 each 1   furosemide (LASIX) 40 MG tablet Take 1 tablet (40 mg total) by mouth 2 (two) times daily. 30 tablet 11   losartan (COZAAR) 25 MG tablet Take 0.5 tablets (12.5 mg total) by mouth daily. 30 tablet 1   metFORMIN (GLUCOPHAGE) 500 MG tablet Take 500 mg by mouth 2 (two) times daily with a meal.     methadone (DOLOPHINE) 10 MG tablet Take 1 tablet by mouth 2 (two) times daily.     metoprolol succinate (TOPROL-XL) 100 MG 24 hr tablet Take 100 mg by mouth daily.     midodrine (PROAMATINE) 10 MG tablet Take 1 tablet (10 mg total)  by mouth 3 (three) times daily with meals. 90 tablet 1   nitroGLYCERIN (NITROSTAT) 0.4 MG SL tablet Place 1 tablet (0.4 mg total) under the tongue every 5 (five) minutes x 3 doses as needed for chest pain. 30 tablet 0   oxyCODONE (OXY IR/ROXICODONE) 5 MG immediate release tablet Take 10 mg by mouth every 4 (four) hours as needed for severe pain.     thiamine (VITAMIN B-1) 100 MG tablet Take 1 tablet (100 mg total) by mouth daily. 30 tablet 3   Tiotropium Bromide-Olodaterol 2.5-2.5 MCG/ACT AERS INHALE 2 INHALATIONS BY ORAL INHALATION ONCE EVERY DAY     apixaban (ELIQUIS) 5 MG TABS tablet Take 1 tablet (5 mg total) by mouth 2 (two) times daily. 60 tablet 0   gabapentin (NEURONTIN) 300 MG capsule Take 1 capsule  (300 mg total) by mouth 3 (three) times daily as needed. 90 capsule 0   tiotropium (SPIRIVA) 18 MCG inhalation capsule Place 1 capsule (18 mcg total) into inhaler and inhale daily. 30 capsule 1   Current Facility-Administered Medications  Medication Dose Route Frequency Provider Last Rate Last Admin   potassium chloride SA (KLOR-CON M) CR tablet 20 mEq  20 mEq Oral Daily Neoma Laming A, MD        Allergies:   Niacin and Penicillin g    Social History:   reports that he has been smoking cigarettes. He has been smoking an average of 1.5 packs per day. He has never used smokeless tobacco. He reports current alcohol use of about 2.0 - 3.0 standard drinks of alcohol per week. He reports current drug use. Drug: Marijuana.   Family History:  family history is not on file.    ROS:     Review of Systems  Constitutional: Negative.   HENT: Negative.    Eyes: Negative.   Respiratory:  Positive for shortness of breath.   Cardiovascular:  Positive for chest pain.  Gastrointestinal: Negative.   Genitourinary: Negative.   Musculoskeletal: Negative.   Skin: Negative.   Neurological: Negative.   Endo/Heme/Allergies: Negative.   Psychiatric/Behavioral: Negative.    All other systems reviewed and are negative.     All other systems are reviewed and negative.    PHYSICAL EXAM: VS:  BP 120/70   Pulse (!) 123   Ht '5\' 10"'$  (1.778 m)   Wt 141 lb 12.8 oz (64.3 kg)   SpO2 97%   BMI 20.35 kg/m  , BMI Body mass index is 20.35 kg/m. Last weight:  Wt Readings from Last 3 Encounters:  06/15/22 141 lb 12.8 oz (64.3 kg)  06/13/22 148 lb 5.9 oz (67.3 kg)  06/09/22 148 lb 6.4 oz (67.3 kg)     Physical Exam Vitals reviewed.  Constitutional:      Appearance: Normal appearance. He is normal weight.  HENT:     Head: Normocephalic.     Nose: Nose normal.     Mouth/Throat:     Mouth: Mucous membranes are moist.  Eyes:     Pupils: Pupils are equal, round, and reactive to light.  Cardiovascular:      Rate and Rhythm: Normal rate and regular rhythm.     Pulses: Normal pulses.     Heart sounds: Normal heart sounds.  Pulmonary:     Effort: Pulmonary effort is normal.  Abdominal:     General: Abdomen is flat. Bowel sounds are normal.  Musculoskeletal:        General: Normal range of motion.  Cervical back: Normal range of motion.  Skin:    General: Skin is warm.  Neurological:     General: No focal deficit present.     Mental Status: He is alert.  Psychiatric:        Mood and Affect: Mood normal.       EKG:   Recent Labs: 04/19/2022: TSH 1.545 05/04/2022: Magnesium 2.3 05/27/2022: ALT 21 06/13/2022: B Natriuretic Peptide 1,533.9; BUN 17; Creatinine, Ser 0.91; Hemoglobin 12.0; Platelets 337; Potassium 4.4; Sodium 133    Lipid Panel    Component Value Date/Time   CHOL 151 03/21/2022 0442   TRIG 82 03/21/2022 0442   HDL 39 (L) 03/21/2022 0442   CHOLHDL 3.9 03/21/2022 0442   VLDL 16 03/21/2022 0442   LDLCALC 96 03/21/2022 0442      Other studies Reviewed: Additional studies/ records that were reviewed today include:  Review of the above records demonstrates:       No data to display            [place]    Aortic Valve Disorder    Atrial Fibrillation    Congestive Heart Failure    Coronary Artery Disease    Diabetes Mellitus Type 2 Without Complication    Hyperlipidemia    Peripheral Vascular Disease     CHIEF COMPLAINT  The Chief Complaint is: SWELLING RIGHT FOOT AND ANKLE.     HISTORY OF PRESENT ILLNESS  Jason Beck is a 67 year old male.   Patient in office complaining of edema and pain of right foot.  Allergy list reviewed   Problem list reviewed   Medication list reviewed    No chest pain or discomfort   No palpitations  , and The heart rate was not fast    Not feeling congested in the chest   No dyspnea   No cough   No wheezing     CURRENT MEDICATION    Amiodarone HCl 200 MG Oral Tablet once a day, 30  days, 0 refills    Atorvastatin Calcium 80 MG Oral Tablet once a day 0 days, 0 refills    Clopidogrel Bisulfate 75 MG Oral Tablet once a day 0 days, 0 refills    Eliquis 5 MG Oral Tablet twice a day 0 days, 0 refills    Furosemide 20 MG Oral Tablet once a day, 30 days, 2 refills    Jardiance 10 MG Oral Tablet once a day 0 days, 0 refills    Metoprolol Succinate ER 100 MG Oral Tablet Extended Release 24 Hour once a day, 30 days, 2 refills    Midodrine HCl 10 MG Oral Tablet three times a day 0 days, 0 refills    Ranolazine ER 500 MG Oral Tablet Extended Release 12 Hour twice a day, 30 days, 2 refills     PAST MEDICAL/SURGICAL HISTORY  Diagnoses:  Atrial fibrillation Coronary artery disease Acute myocardial infarction Prior myocardial infarction Post-angioplasty Congestive heart failure Aortic regurgitation Mitral regurgitation Essential hypertension Peripheral vascular disease.   Hyperlipidemia.   Diabetes mellitus  Type 2 diabetes mellitus.   Nicotine dependence     SOCIAL HISTORY  Tobacco use:  Current smoker and smoking status: Current everyday smoker. Alcohol: Not using alcohol. Habits: Poor exercise habits.     ALLERGIES    Niacin     REVIEW OF SYSTEMS  Systemic: Not feeling poorly (malaise).  No recent weight change. Head: No headache and no sinus pain. Cardiovascular: No chest pain or  discomfort, no palpitations, and the heart rate was not fast. Pulmonary: No dyspnea, no cough, and no wheezing. Gastrointestinal: No heartburn.  No nausea, no vomiting, no abdominal pain, and no diarrhea. Neurological: No dizziness, no vertigo, and no fainting. Psychological: No anxiety and no depression.     PHYSICAL FINDINGS    Vitals taken 05/19/2022 02:18 pm  BP-Sitting  130/70 mmHg 100 - 120/60 - 80  Pulse Rate-Sitting  79 bpm 50 - 100  Temp-Oral  97.3 F 96 - 101  Height  65 in 64 - 74  Weight  140 lbs 6.4 oz 123 - 215  Body Mass Index  23.4 kg/m2   Body Surface  Area  1.7 m2   Oxygen Saturation  99 % 93 - 100    General Appearance:  In no acute distress. Lungs:  Clear to auscultation. Cardiovascular: Jugular Venous Distention:  JVD not increased.   JVD not increased. Heart Rate And Rhythm:  Normal.   Normal. Heart Sounds:  Normal. Murmurs:  No murmurs were heard. Carotid Arteries:  No bruit in the carotid artery. Arterial Pulses:  Equal bilaterally and normal. Edema:  Not present. Abdomen: Auscultation:  Bowel sounds were normal. Palpation:  Abdominal non-tender. Neurological:  Oriented to time, place, and person.     ASSESSMENT   Atrial fibrillation  Coronary artery disease  Post-angioplasty  Congestive heart failure  Aortic regurgitation  Mitral regurgitation  Systemic hypertension  Peripheral vascular disease  Edema  Hyperlipidemia  Type 2 diabetes mellitus  Nicotine dependence     THERAPY   Continue current medication.  Clinical summary provided to patient.     COUNSELING/EDUCATION   Education and counseling diet and exercise     PLAN     Nicotine dependence, unspecified, uncomplicated RADIOLOGY/ULTRASOUND: EXTREMITY VEINS-DUPLEX SCAN-UNILATERAL (LIMITED)     Return to the clinic if condition worsens or new symptoms arise  Follow-up visit Patient complaining of right foot edema, pain.    Venous doppler to check for DVT.    Return as previously scheduled.      Neoma Laming MD  Electronically signed by: Neoma Laming     Date: 05/19/2022 14:25 ASSESSMENT AND PLAN:    ICD-10-CM   1. Acute systolic congestive heart failure (HCC)  I50.21 potassium chloride SA (KLOR-CON M) CR tablet 20 mEq    MYOCARDIAL PERFUSION IMAGING    PCV ECHOCARDIOGRAM COMPLETE    MYOCARDIAL PERFUSION IMAGING    PCV ECHOCARDIOGRAM COMPLETE   seen at [facility] over the weekend and changed lasix 40 bid. Will do echo/stress test as SOB/with chest tightness    2. Type 2 diabetes mellitus without complication, without long-term  current use of insulin (HCC)  E11.9 POCT Glucose (CBG)    potassium chloride SA (KLOR-CON M) CR tablet 20 mEq    MYOCARDIAL PERFUSION IMAGING    PCV ECHOCARDIOGRAM COMPLETE    MYOCARDIAL PERFUSION IMAGING    PCV ECHOCARDIOGRAM COMPLETE    3. Chronic atrial fibrillation (HCC)  I48.20 potassium chloride SA (KLOR-CON M) CR tablet 20 mEq    MYOCARDIAL PERFUSION IMAGING    PCV ECHOCARDIOGRAM COMPLETE    MYOCARDIAL PERFUSION IMAGING    PCV ECHOCARDIOGRAM COMPLETE    4. Rheumatic aortic stenosis  I06.0 potassium chloride SA (KLOR-CON M) CR tablet 20 mEq    MYOCARDIAL PERFUSION IMAGING    PCV ECHOCARDIOGRAM COMPLETE    MYOCARDIAL PERFUSION IMAGING    PCV ECHOCARDIOGRAM COMPLETE    5. Essential hypertension  I10 potassium chloride SA (KLOR-CON M) CR  tablet 20 mEq    MYOCARDIAL PERFUSION IMAGING    PCV ECHOCARDIOGRAM COMPLETE    MYOCARDIAL PERFUSION IMAGING    PCV ECHOCARDIOGRAM COMPLETE    6. NSTEMI (non-ST elevated myocardial infarction) (HCC)  I21.4 potassium chloride SA (KLOR-CON M) CR tablet 20 mEq    MYOCARDIAL PERFUSION IMAGING    PCV ECHOCARDIOGRAM COMPLETE    MYOCARDIAL PERFUSION IMAGING    PCV ECHOCARDIOGRAM COMPLETE    7. Other chest pain  R07.89 MYOCARDIAL PERFUSION IMAGING    PCV ECHOCARDIOGRAM COMPLETE    MYOCARDIAL PERFUSION IMAGING    PCV ECHOCARDIOGRAM COMPLETE    8. CHF (congestive heart failure), NYHA class III, acute on chronic, diastolic (HCC)  [id number] MYOCARDIAL PERFUSION IMAGING    PCV ECHOCARDIOGRAM COMPLETE    MYOCARDIAL PERFUSION IMAGING    PCV ECHOCARDIOGRAM COMPLETE    9. SOB (shortness of breath)  R06.02 MYOCARDIAL PERFUSION IMAGING    PCV ECHOCARDIOGRAM COMPLETE    MYOCARDIAL PERFUSION IMAGING    PCV ECHOCARDIOGRAM COMPLETE       Problem List Items Addressed This Visit       Cardiovascular and Mediastinum   NSTEMI (non-ST elevated myocardial infarction) (HCC)   Relevant Medications   furosemide (LASIX) 40 MG tablet   potassium chloride SA  (KLOR-CON M) CR tablet 20 mEq   Other Relevant Orders   MYOCARDIAL PERFUSION IMAGING   PCV ECHOCARDIOGRAM COMPLETE   MYOCARDIAL PERFUSION IMAGING   PCV ECHOCARDIOGRAM COMPLETE   Acute CHF (congestive heart failure) (HCC) - Primary   Relevant Medications   furosemide (LASIX) 40 MG tablet   potassium chloride SA (KLOR-CON M) CR tablet 20 mEq   Other Relevant Orders   MYOCARDIAL PERFUSION IMAGING   PCV ECHOCARDIOGRAM COMPLETE   MYOCARDIAL PERFUSION IMAGING   PCV ECHOCARDIOGRAM COMPLETE   Essential hypertension   Relevant Medications   furosemide (LASIX) 40 MG tablet   potassium chloride SA (KLOR-CON M) CR tablet 20 mEq   Other Relevant Orders   MYOCARDIAL PERFUSION IMAGING   PCV ECHOCARDIOGRAM COMPLETE   MYOCARDIAL PERFUSION IMAGING   PCV ECHOCARDIOGRAM COMPLETE   Chronic atrial fibrillation (HCC)   Relevant Medications   furosemide (LASIX) 40 MG tablet   potassium chloride SA (KLOR-CON M) CR tablet 20 mEq   Other Relevant Orders   MYOCARDIAL PERFUSION IMAGING   PCV ECHOCARDIOGRAM COMPLETE   MYOCARDIAL PERFUSION IMAGING   PCV ECHOCARDIOGRAM COMPLETE   Aortic valve stenosis   Relevant Medications   furosemide (LASIX) 40 MG tablet   potassium chloride SA (KLOR-CON M) CR tablet 20 mEq   Other Relevant Orders   MYOCARDIAL PERFUSION IMAGING   PCV ECHOCARDIOGRAM COMPLETE   MYOCARDIAL PERFUSION IMAGING   PCV ECHOCARDIOGRAM COMPLETE   Other Visit Diagnoses     Type 2 diabetes mellitus without complication, without long-term current use of insulin (HCC)       Relevant Medications   potassium chloride SA (KLOR-CON M) CR tablet 20 mEq   Other Relevant Orders   POCT Glucose (CBG)   MYOCARDIAL PERFUSION IMAGING   PCV ECHOCARDIOGRAM COMPLETE   MYOCARDIAL PERFUSION IMAGING   PCV ECHOCARDIOGRAM COMPLETE   Other chest pain       Relevant Orders   MYOCARDIAL PERFUSION IMAGING   PCV ECHOCARDIOGRAM COMPLETE   MYOCARDIAL PERFUSION IMAGING   PCV ECHOCARDIOGRAM COMPLETE   CHF  (congestive heart failure), NYHA class III, acute on chronic, diastolic (HCC)       Relevant Medications   furosemide (LASIX) 40 MG tablet   Other Relevant  Orders   MYOCARDIAL PERFUSION IMAGING   PCV ECHOCARDIOGRAM COMPLETE   MYOCARDIAL PERFUSION IMAGING   PCV ECHOCARDIOGRAM COMPLETE   SOB (shortness of breath)       Relevant Orders   MYOCARDIAL PERFUSION IMAGING   PCV ECHOCARDIOGRAM COMPLETE   MYOCARDIAL PERFUSION IMAGING   PCV ECHOCARDIOGRAM COMPLETE          Disposition:   Return in about 2 weeks (around 06/29/2022) for echo and stress test and .    Total time spent: 30 minutes  Signed,  Neoma Laming, MD  06/15/2022 12:26 [place]

## 2022-06-16 ENCOUNTER — Other Ambulatory Visit: Payer: Self-pay

## 2022-06-16 ENCOUNTER — Emergency Department
Admission: EM | Admit: 2022-06-16 | Discharge: 2022-06-16 | Disposition: A | Discharge status: Home / Self Care | Payer: No Typology Code available for payment source | Attending: Emergency Medicine | Admitting: Emergency Medicine

## 2022-06-16 ENCOUNTER — Encounter: Payer: No Typology Code available for payment source | Admitting: Cardiology

## 2022-06-16 ENCOUNTER — Emergency Department: Payer: No Typology Code available for payment source

## 2022-06-16 DIAGNOSIS — Z7901 Long term (current) use of anticoagulants: Secondary | ICD-10-CM | POA: Diagnosis not present

## 2022-06-16 DIAGNOSIS — Z1152 Encounter for screening for COVID-19: Secondary | ICD-10-CM | POA: Insufficient documentation

## 2022-06-16 DIAGNOSIS — E119 Type 2 diabetes mellitus without complications: Secondary | ICD-10-CM | POA: Insufficient documentation

## 2022-06-16 DIAGNOSIS — R0602 Shortness of breath: Secondary | ICD-10-CM | POA: Diagnosis present

## 2022-06-16 DIAGNOSIS — I4891 Unspecified atrial fibrillation: Secondary | ICD-10-CM | POA: Diagnosis not present

## 2022-06-16 DIAGNOSIS — R0600 Dyspnea, unspecified: Secondary | ICD-10-CM

## 2022-06-16 DIAGNOSIS — I110 Hypertensive heart disease with heart failure: Secondary | ICD-10-CM | POA: Insufficient documentation

## 2022-06-16 DIAGNOSIS — J449 Chronic obstructive pulmonary disease, unspecified: Secondary | ICD-10-CM | POA: Insufficient documentation

## 2022-06-16 DIAGNOSIS — I5023 Acute on chronic systolic (congestive) heart failure: Secondary | ICD-10-CM | POA: Insufficient documentation

## 2022-06-16 LAB — TROPONIN I (HIGH SENSITIVITY)
Troponin I (High Sensitivity): 12 ng/L (ref <18)
Troponin I (High Sensitivity): 13 ng/L (ref <18)

## 2022-06-16 LAB — BASIC METABOLIC PANEL
Anion gap: 14 (ref 5–15)
BUN: 21 mg/dL (ref 8–23)
CO2: 25 mmol/L (ref 22–32)
Calcium: 8.8 mg/dL — ABNORMAL LOW (ref 8.9–10.3)
Chloride: 101 mmol/L (ref 98–111)
Creatinine, Ser: 1.01 mg/dL (ref 0.61–1.24)
GFR, Estimated: >60 mL/min (ref >60)
Glucose, Bld: 172 mg/dL — ABNORMAL HIGH (ref 70–99)
Potassium: 4.2 mmol/L (ref 3.5–5.1)
Sodium: 140 mmol/L (ref 135–145)

## 2022-06-16 LAB — RESP PANEL BY RT-PCR (RSV, FLU A&B, COVID)  RVPGX2
Influenza A by PCR: NEGATIVE
Influenza B by PCR: NEGATIVE
Resp Syncytial Virus by PCR: NEGATIVE
SARS Coronavirus 2 by RT PCR: NEGATIVE

## 2022-06-16 LAB — CBC
HCT: 37.2 % — ABNORMAL LOW (ref 39.0–52.0)
Hemoglobin: 11.7 g/dL — ABNORMAL LOW (ref 13.0–17.0)
MCH: 26.5 pg (ref 26.0–34.0)
MCHC: 31.5 g/dL (ref 30.0–36.0)
MCV: 84.2 fL (ref 80.0–100.0)
Platelets: 362 10*3/uL (ref 150–400)
RBC: 4.42 MIL/uL (ref 4.22–5.81)
RDW: 21.1 % — ABNORMAL HIGH (ref 11.5–15.5)
WBC: 7.2 10*3/uL (ref 4.0–10.5)
nRBC: 0 % (ref 0.0–0.2)

## 2022-06-16 LAB — BLOOD GAS, VENOUS
Acid-Base Excess: 2.4 mmol/L — ABNORMAL HIGH (ref 0.0–2.0)
Bicarbonate: 26.4 mmol/L (ref 20.0–28.0)
O2 Saturation: 95.1 %
Patient temperature: 37
pCO2, Ven: 38 mmHg — ABNORMAL LOW (ref 44–60)
pH, Ven: 7.45 — ABNORMAL HIGH (ref 7.25–7.43)
pO2, Ven: 66 mmHg — ABNORMAL HIGH (ref 32–45)

## 2022-06-16 LAB — BRAIN NATRIURETIC PEPTIDE: B Natriuretic Peptide: 682.1 pg/mL — ABNORMAL HIGH (ref 0.0–100.0)

## 2022-06-16 MED ORDER — IPRATROPIUM-ALBUTEROL 0.5-2.5 (3) MG/3ML IN SOLN
9.0000 mL | Freq: Once | RESPIRATORY_TRACT | Status: AC
  Administered 2022-06-16: 9 mL via RESPIRATORY_TRACT
  Filled 2022-06-16: qty 9

## 2022-06-16 MED ORDER — METHYLPREDNISOLONE SODIUM SUCC 125 MG IJ SOLR
125.0000 mg | Freq: Once | INTRAMUSCULAR | Status: AC
  Administered 2022-06-16: 125 mg via INTRAVENOUS
  Filled 2022-06-16: qty 2

## 2022-06-16 MED ORDER — FUROSEMIDE 10 MG/ML IJ SOLN
40.0000 mg | Freq: Once | INTRAMUSCULAR | Status: AC
  Administered 2022-06-16: 40 mg via INTRAVENOUS
  Filled 2022-06-16: qty 4

## 2022-06-16 NOTE — ED Triage Notes (Signed)
Pt c/o intermittent chest pain started 12nn, was discharged on Sunday for same. Denies pain now. Seen PCP yesterday, increased Lasix and put pt on potassium. Denies fever, no dizziness.

## 2022-06-16 NOTE — ED Provider Notes (Signed)
[facility] Provider Note    Event Date/Time   First MD Initiated Contact with Patient 06/16/22 1512     (approximate)   History   Chest Pain   HPI  Jason Beck is a 67 y.o. male past medical history significant for COPD, atrial fibrillation on Eliquis, diabetes, hypertension, bicuspid aortic valve,, tobacco use, who presents to the emergency department with shortness of breath.  Patient endorses progressively worsening and intermittent shortness of breath that has been ongoing since Friday.  Evaluated in the emergency department on Saturday and given a dose of IV Lasix.  Was taking twice daily 20 mg of Lasix but states that he since yesterday he was evaluated at his cardiologist and told to increase to 40 mg twice daily.  First dose of 40 mg was this morning.  Denies any significant chest pain and endorses intermittent left-sided chest pain over the past couple of days but no active chest pain at this time.  Denies any cough or sputum production.  Denies fever or chills.  No weight gain, endorses some weight loss.  No prior history of DVT or PE.  States that he is no longer on Eliquis and is only taking Plavix.  States t he was told to do this by his cardiologist given difficulty to afford the medication.     Physical Exam   Triage Vital Signs: ED Triage Vitals  Enc Vitals Group     BP 06/16/22 1503 121/79     Pulse Rate 06/16/22 1503 99     Resp 06/16/22 1503 18     Temp 06/16/22 1503 97.6 F (36.4 C)     Temp Source 06/16/22 1503 Other     SpO2 06/16/22 1503 96 %     Weight 06/16/22 1501 137 lb (62.1 kg)     Height 06/16/22 1501 '5\' 10"'$  (1.778 m)     Head Circumference --      Peak Flow --      Pain Score 06/16/22 1501 0     Pain Loc --      Pain Edu? --      Excl. in [place]? --     Most recent vital signs: Vitals:   06/16/22 1700 06/16/22 1830  BP: (!) 132/93 107/87  Pulse: (!) 107 (!) 45  Resp: 15 16  Temp:    SpO2: 98% 96%    Physical  Exam Constitutional:      Appearance: He is well-developed.  HENT:     Head: Atraumatic.  Eyes:     Conjunctiva/sclera: Conjunctivae normal.  Cardiovascular:     Rate and Rhythm: Rhythm irregular.     Heart sounds: Murmur heard.  Pulmonary:     Effort: No respiratory distress.     Comments: Diffuse inspiratory and expiratory wheezing.  Crackles to bilateral lower lung fields Musculoskeletal:     Cervical back: Normal range of motion.     Right lower leg: No edema.     Left lower leg: No edema.  Skin:    General: Skin is warm.     Capillary Refill: Capillary refill takes less than 2 seconds.  Neurological:     Mental Status: He is alert. Mental status is at baseline.     IMPRESSION / MDM / ASSESSMENT AND PLAN / ED COURSE  I reviewed the triage vital signs and the nursing notes.  DDx - COPD exacerbation, CHF exacerbation, viral illness - including covid/influenza, PNA, ACS dysrhythmia  On chart review  patient had a recent left heart cath in January of this year that showed coronary artery disease but nothing that was significant enough for stent placement, has been continued on medical management.  Recent echocardiogram.  EKG  I, Nathaniel Man, the attending physician, personally viewed and interpreted this ECG.   Rate: 102  Rhythm: Atrial fibrillation  Axis: Normal  Intervals: Normal  ST&T Change: None  Atrial fibrillation while on cardiac telemetry.  RADIOLOGY I independently reviewed imaging, my interpretation of imaging: Chest x-ray -no findings of significant edema.  No signs of pneumonia  LABS (all labs ordered are listed, but only abnormal results are displayed) Labs interpreted as -   No significant leukocytosis.  Troponin is negative.  BNP has improved when compared to his recent hospital lab work. Labs Reviewed  BASIC METABOLIC PANEL - Abnormal; Notable for the following components:      Result Value   Glucose, Bld 172 (*)    Calcium 8.8 (*)    All  other components within normal limits  CBC - Abnormal; Notable for the following components:   Hemoglobin 11.7 (*)    HCT 37.2 (*)    RDW 21.1 (*)    All other components within normal limits  BLOOD GAS, VENOUS - Abnormal; Notable for the following components:   pH, Ven 7.45 (*)    pCO2, Ven 38 (*)    pO2, Ven 66 (*)    Acid-Base Excess 2.4 (*)    All other components within normal limits  BRAIN NATRIURETIC PEPTIDE - Abnormal; Notable for the following components:   B Natriuretic Peptide 682.1 (*)    All other components within normal limits  RESP PANEL BY RT-PCR (RSV, FLU A&B, COVID)  [id number]  TROPONIN I (HIGH SENSITIVITY)  TROPONIN I (HIGH SENSITIVITY)    TREATMENT  IV Lasix, IV Solu-Medrol, DuoNebs  MDM  On reevaluation patient states that he is feeling much better.  No longer with shortness of breath.  Improvement of his tachycardia.  Remained stable on room air.  Patient has known history of aortic stenosis and states that he has a referral in to follow-up with heart failure clinic that is in [place].  States that he needs to call to make an appointment.  Patient with a good amount of urine output with 1 dose of IV Lasix.  Just started his 40 mg of Lasix this morning.  Patient is no longer on anticoagulation for his atrial fibrillation and has a high [place] Vasc, discussed follow-up with his cardiologist to discuss anticoagulation.  States that he will return to the emergency department if his symptoms worsen.     PROCEDURES:  Critical Care performed: No  Procedures  Patient's presentation is most consistent with acute presentation with potential threat to life or bodily function.   MEDICATIONS ORDERED IN ED: Medications  ipratropium-albuterol (DUONEB) 0.5-2.5 (3) MG/3ML nebulizer solution 9 mL (9 mLs Nebulization Given 06/16/22 1715)  methylPREDNISolone sodium succinate (SOLU-MEDROL) 125 mg/2 mL injection 125 mg (125 mg Intravenous Given 06/16/22 1710)  furosemide  (LASIX) injection 40 mg (40 mg Intravenous Given 06/16/22 1712)    FINAL CLINICAL IMPRESSION(S) / ED DIAGNOSES   Final diagnoses:  Dyspnea, unspecified type  Acute on chronic systolic heart failure ([place])     Rx / DC Orders   ED Discharge Orders          Ordered    Ambulatory referral to Cardiology       Comments: If you have not heard from the  Cardiology office within the next 72 hours please call [phone].   06/16/22 1853             Note:  This document was prepared using Dragon voice recognition software and may include unintentional dictation errors.   Nathaniel Man, MD 06/16/22 [phone]

## 2022-06-16 NOTE — Discharge Instructions (Addendum)
You were seen in the emergency department for shortness of breath.  You received an extra dose of IV Lasix in the emergency department and had significant urine output.   You are no longer on a blood thinner but you continue to have atrial fibrillation, it is important that you call your cardiologist to schedule a close follow-up appointment, discuss blood thinners.  Call heart failure clinic to see if you need to be seen again for worsening heart failure symptoms given your known aortic stenosis.  Stop smoking cigarettes as this is likely adding to your shortness of breath.  Call your primary care physician for Jason Beck in the morning to schedule close follow-up.  Return to the emergency department for any worsening symptoms.

## 2022-06-22 ENCOUNTER — Emergency Department
Admission: EM | Admit: 2022-06-22 | Discharge: 2022-06-22 | Disposition: A | Discharge status: Home / Self Care | Payer: No Typology Code available for payment source | Attending: Emergency Medicine | Admitting: Emergency Medicine

## 2022-06-22 ENCOUNTER — Encounter: Payer: Self-pay | Admitting: Emergency Medicine

## 2022-06-22 ENCOUNTER — Emergency Department: Payer: No Typology Code available for payment source

## 2022-06-22 DIAGNOSIS — R7989 Other specified abnormal findings of blood chemistry: Secondary | ICD-10-CM | POA: Diagnosis not present

## 2022-06-22 DIAGNOSIS — R0602 Shortness of breath: Secondary | ICD-10-CM | POA: Diagnosis present

## 2022-06-22 DIAGNOSIS — R0789 Other chest pain: Secondary | ICD-10-CM | POA: Diagnosis not present

## 2022-06-22 LAB — CBC
HCT: 41.2 % (ref 39.0–52.0)
Hemoglobin: 12.5 g/dL — ABNORMAL LOW (ref 13.0–17.0)
MCH: 25.8 pg — ABNORMAL LOW (ref 26.0–34.0)
MCHC: 30.3 g/dL (ref 30.0–36.0)
MCV: 84.9 fL (ref 80.0–100.0)
Platelets: 376 10*3/uL (ref 150–400)
RBC: 4.85 MIL/uL (ref 4.22–5.81)
RDW: 21 % — ABNORMAL HIGH (ref 11.5–15.5)
WBC: 10.3 10*3/uL (ref 4.0–10.5)
nRBC: 0 % (ref 0.0–0.2)

## 2022-06-22 LAB — BASIC METABOLIC PANEL
Anion gap: 10 (ref 5–15)
BUN: 18 mg/dL (ref 8–23)
CO2: 27 mmol/L (ref 22–32)
Calcium: 8.9 mg/dL (ref 8.9–10.3)
Chloride: 99 mmol/L (ref 98–111)
Creatinine, Ser: 0.98 mg/dL (ref 0.61–1.24)
GFR, Estimated: >60 mL/min (ref >60)
Glucose, Bld: 123 mg/dL — ABNORMAL HIGH (ref 70–99)
Potassium: 3.9 mmol/L (ref 3.5–5.1)
Sodium: 136 mmol/L (ref 135–145)

## 2022-06-22 LAB — TROPONIN I (HIGH SENSITIVITY)
Troponin I (High Sensitivity): 11 ng/L (ref <18)
Troponin I (High Sensitivity): 11 ng/L (ref <18)

## 2022-06-22 LAB — BRAIN NATRIURETIC PEPTIDE: B Natriuretic Peptide: 491.8 pg/mL — ABNORMAL HIGH (ref 0.0–100.0)

## 2022-06-22 MED ORDER — FUROSEMIDE 10 MG/ML IJ SOLN
60.0000 mg | Freq: Once | INTRAMUSCULAR | Status: AC
  Administered 2022-06-22: 60 mg via INTRAVENOUS
  Filled 2022-06-22: qty 8

## 2022-06-22 NOTE — Discharge Instructions (Signed)
Please seek medical attention for any high fevers, chest pain, shortness of breath, change in behavior, persistent vomiting, bloody stool or any other new or concerning symptoms.  

## 2022-06-22 NOTE — ED Triage Notes (Signed)
Pt presents ambulatory to triage via POV with complaints of L sided CP that started an hour ago without radiation. He states his pain is 5/10. Hx of COPD - no O2 requirement. No meds taken PTA. A&Ox4 at this time. Denies fevers, chills, N/V/D, SOB.

## 2022-06-22 NOTE — ED Provider Notes (Signed)
[facility] Provider Note    Event Date/Time   First MD Initiated Contact with Patient 06/22/22 0354     (approximate)   History   Shortness of breath   HPI  Jason Beck is a 67 y.o. male who presents to the emergency department today with primary concern for shortness of breath.  States that he started becoming short of breath yesterday.  This was accompanied by some very mild left sided chest discomfort.  The time my exam he says the chest discomfort has resolved although he continues to be somewhat short of breath.  He denies any fevers.  Is not sure if he has been coughing.  The symptoms remind him of the symptoms he had when he was seen in the emergency department roughly a week ago for shortness of breath.  States that he does take Lasix at home.     Physical Exam   Triage Vital Signs: ED Triage Vitals [06/22/22 0033]  Enc Vitals Group     BP 113/74     Pulse Rate (!) 101     Resp 20     Temp 97.8 F (36.6 C)     Temp Source Oral     SpO2 98 %     Weight 134 lb 14.7 oz (61.2 kg)     Height '5\' 10"'$  (1.778 m)     Head Circumference      Peak Flow      Pain Score 5     Pain Loc      Pain Edu?      Excl. in [place]?     Most recent vital signs: Vitals:   06/22/22 0033 06/22/22 0334  BP: 113/74 (!) 140/83  Pulse: (!) 101 75  Resp: 20 13  Temp: 97.8 F (36.6 C)   SpO2: 98% 99%   General: Awake, alert, oriented. CV:  Good peripheral perfusion. Regular rate and rhythm. Resp:  Normal effort. Lungs clear. Abd:  No distention.     ED Results / Procedures / Treatments   Labs (all labs ordered are listed, but only abnormal results are displayed) Labs Reviewed  BASIC METABOLIC PANEL - Abnormal; Notable for the following components:      Result Value   Glucose, Bld 123 (*)    All other components within normal limits  CBC - Abnormal; Notable for the following components:   Hemoglobin 12.5 (*)    MCH 25.8 (*)    RDW 21.0 (*)    All  other components within normal limits  BRAIN NATRIURETIC PEPTIDE - Abnormal; Notable for the following components:   B Natriuretic Peptide 491.8 (*)    All other components within normal limits  TROPONIN I (HIGH SENSITIVITY)  TROPONIN I (HIGH SENSITIVITY)     EKG  I, Nance Pear, attending physician, personally viewed and interpreted this EKG  EKG Time: 0029 Rate: 69 Rhythm: atrial fibrillation Axis: rightward axis Intervals: qtc 501 QRS: non specific intraventricular conduction delay ST changes: no st elevation Impression: abnormal ekg    RADIOLOGY I independently interpreted and visualized the CXR. My interpretation: No pneumonia Radiology interpretation:  IMPRESSION:  Stable vascular congestion.  No acute abnormality noted.      PROCEDURES:  Critical Care performed: No    MEDICATIONS ORDERED IN ED: Medications - No data to display   IMPRESSION / MDM / [place] / ED COURSE  I reviewed the triage vital signs and the nursing notes.  Differential diagnosis includes, but is not limited to, ACS, pneumonia, pneumothorax, PE, dissection, CHF, COPD  Patient's presentation is most consistent with acute presentation with potential threat to life or bodily function.   The patient is on the cardiac monitor to evaluate for evidence of arrhythmia and/or significant heart rate changes.  Patient presented to the emergency department today because of concerns for shortness of breath.  He states he did have small amount of associated chest pain which had resolved by the time my exam.  Blood work here with troponin negative x 2.  BNP is slightly elevated.  Chest x-ray does show some vascular congestion.  This time I do wonder if patient is suffering from slight CHF exacerbation.  I discussed this with the patient.  Will give dose of IV Lasix here.  I do think it would then be reasonable for patient be discharged home.      FINAL  CLINICAL IMPRESSION(S) / ED DIAGNOSES   Final diagnoses:  Shortness of breath    Note:  This document was prepared using Dragon voice recognition software and may include unintentional dictation errors.Nance Pear, MD 06/22/22 [phone]

## 2022-06-23 ENCOUNTER — Ambulatory Visit (INDEPENDENT_AMBULATORY_CARE_PROVIDER_SITE_OTHER): Payer: Medicare PPO | Admitting: Cardiovascular Disease

## 2022-06-23 DIAGNOSIS — E119 Type 2 diabetes mellitus without complications: Secondary | ICD-10-CM

## 2022-06-23 DIAGNOSIS — I340 Nonrheumatic mitral (valve) insufficiency: Secondary | ICD-10-CM

## 2022-06-23 DIAGNOSIS — I351 Nonrheumatic aortic (valve) insufficiency: Secondary | ICD-10-CM | POA: Diagnosis not present

## 2022-06-23 DIAGNOSIS — I482 Chronic atrial fibrillation, unspecified: Secondary | ICD-10-CM

## 2022-06-23 DIAGNOSIS — R0602 Shortness of breath: Secondary | ICD-10-CM

## 2022-06-23 DIAGNOSIS — I361 Nonrheumatic tricuspid (valve) insufficiency: Secondary | ICD-10-CM

## 2022-06-23 DIAGNOSIS — I5033 Acute on chronic diastolic (congestive) heart failure: Secondary | ICD-10-CM

## 2022-06-23 DIAGNOSIS — I5021 Acute systolic (congestive) heart failure: Secondary | ICD-10-CM

## 2022-06-23 DIAGNOSIS — R0789 Other chest pain: Secondary | ICD-10-CM

## 2022-06-23 DIAGNOSIS — I060 Rheumatic aortic stenosis: Secondary | ICD-10-CM

## 2022-06-23 DIAGNOSIS — I10 Essential (primary) hypertension: Secondary | ICD-10-CM

## 2022-06-23 DIAGNOSIS — I214 Non-ST elevation (NSTEMI) myocardial infarction: Secondary | ICD-10-CM

## 2022-06-24 ENCOUNTER — Other Ambulatory Visit: Payer: Self-pay

## 2022-06-24 DIAGNOSIS — I10 Essential (primary) hypertension: Secondary | ICD-10-CM

## 2022-06-24 DIAGNOSIS — I480 Paroxysmal atrial fibrillation: Secondary | ICD-10-CM

## 2022-06-24 DIAGNOSIS — I214 Non-ST elevation (NSTEMI) myocardial infarction: Secondary | ICD-10-CM

## 2022-06-25 ENCOUNTER — Ambulatory Visit (INDEPENDENT_AMBULATORY_CARE_PROVIDER_SITE_OTHER): Payer: Medicare PPO

## 2022-06-25 DIAGNOSIS — I214 Non-ST elevation (NSTEMI) myocardial infarction: Secondary | ICD-10-CM

## 2022-06-25 DIAGNOSIS — R0602 Shortness of breath: Secondary | ICD-10-CM

## 2022-06-25 DIAGNOSIS — I060 Rheumatic aortic stenosis: Secondary | ICD-10-CM

## 2022-06-25 DIAGNOSIS — I10 Essential (primary) hypertension: Secondary | ICD-10-CM

## 2022-06-25 DIAGNOSIS — I5033 Acute on chronic diastolic (congestive) heart failure: Secondary | ICD-10-CM

## 2022-06-25 DIAGNOSIS — E119 Type 2 diabetes mellitus without complications: Secondary | ICD-10-CM

## 2022-06-25 DIAGNOSIS — I482 Chronic atrial fibrillation, unspecified: Secondary | ICD-10-CM

## 2022-06-25 DIAGNOSIS — I5021 Acute systolic (congestive) heart failure: Secondary | ICD-10-CM

## 2022-06-25 DIAGNOSIS — R0789 Other chest pain: Secondary | ICD-10-CM | POA: Diagnosis not present

## 2022-06-25 MED ORDER — LOSARTAN POTASSIUM 25 MG PO TABS: 25.0000 mg | ORAL_TABLET | Freq: Every day | ORAL | 11 refills | Status: AC

## 2022-06-25 MED ORDER — TECHNETIUM TC 99M SESTAMIBI GENERIC - CARDIOLITE
34.4000 | Freq: Once | INTRAVENOUS | Status: AC | PRN
  Administered 2022-06-25: 34.4 via INTRAVENOUS

## 2022-06-25 MED ORDER — RANOLAZINE ER 500 MG PO TB12: 500.0000 mg | ORAL_TABLET | Freq: Two times a day (BID) | ORAL | 11 refills | Status: AC

## 2022-06-25 MED ORDER — TECHNETIUM TC 99M SESTAMIBI GENERIC - CARDIOLITE
11.1000 | Freq: Once | INTRAVENOUS | Status: AC | PRN
  Administered 2022-06-25: 11.1 via INTRAVENOUS

## 2022-06-25 MED ORDER — AMIODARONE HCL 200 MG PO TABS: 200.0000 mg | ORAL_TABLET | Freq: Every day | ORAL | 2 refills | Status: DC

## 2022-06-29 ENCOUNTER — Ambulatory Visit: Payer: Medicare PPO | Admitting: Cardiovascular Disease

## 2022-06-30 ENCOUNTER — Ambulatory Visit (INDEPENDENT_AMBULATORY_CARE_PROVIDER_SITE_OTHER): Payer: Medicare PPO | Admitting: Cardiovascular Disease

## 2022-06-30 ENCOUNTER — Ambulatory Visit: Payer: No Typology Code available for payment source

## 2022-06-30 ENCOUNTER — Encounter: Payer: Self-pay | Admitting: Cardiovascular Disease

## 2022-06-30 VITALS — BP 100/70 | HR 65 | Ht 70.0 in | Wt 137.0 lb

## 2022-06-30 DIAGNOSIS — I5041 Acute combined systolic (congestive) and diastolic (congestive) heart failure: Secondary | ICD-10-CM

## 2022-06-30 DIAGNOSIS — I5043 Acute on chronic combined systolic (congestive) and diastolic (congestive) heart failure: Secondary | ICD-10-CM

## 2022-06-30 DIAGNOSIS — I350 Nonrheumatic aortic (valve) stenosis: Secondary | ICD-10-CM

## 2022-06-30 DIAGNOSIS — E785 Hyperlipidemia, unspecified: Secondary | ICD-10-CM

## 2022-06-30 DIAGNOSIS — I482 Chronic atrial fibrillation, unspecified: Secondary | ICD-10-CM

## 2022-06-30 DIAGNOSIS — I214 Non-ST elevation (NSTEMI) myocardial infarction: Secondary | ICD-10-CM

## 2022-06-30 DIAGNOSIS — I10 Essential (primary) hypertension: Secondary | ICD-10-CM

## 2022-06-30 MED ORDER — METOPROLOL SUCCINATE ER 50 MG PO TB24: 50.0000 mg | ORAL_TABLET | Freq: Every day | ORAL | 11 refills | Status: DC

## 2022-06-30 MED ORDER — METOPROLOL SUCCINATE ER 25 MG PO TB24: 25.0000 mg | ORAL_TABLET | Freq: Every day | ORAL | 11 refills | Status: DC

## 2022-06-30 MED ORDER — CLOPIDOGREL BISULFATE 75 MG PO TABS: 75.0000 mg | ORAL_TABLET | Freq: Every day | ORAL | 1 refills | Status: AC

## 2022-06-30 MED ORDER — PANTOPRAZOLE SODIUM 40 MG PO TBEC: 40.0000 mg | DELAYED_RELEASE_TABLET | Freq: Every day | ORAL | 1 refills | Status: DC

## 2022-06-30 MED ORDER — DAPAGLIFLOZIN PROPANEDIOL 5 MG PO TABS: 10.0000 mg | ORAL_TABLET | Freq: Every day | ORAL | 2 refills | Status: DC

## 2022-06-30 NOTE — Assessment & Plan Note (Signed)
Has EF 48%, moderate to severe AS, moderate MR/AR. May need cath for AVR

## 2022-06-30 NOTE — Progress Notes (Signed)
Closing encounter for provider.   

## 2022-06-30 NOTE — Progress Notes (Signed)
Cardiology Office Note   Date:  06/30/2022   ID:  Jason Beck, DOB Aug 24, 1955, MRN [id number]  PCP:  [facility] [place]  Cardiologist:  Neoma Laming, MD      History of Present Illness: Jason Beck is a 67 y.o. male who presents for  Chief Complaint  Patient presents with   Follow-up    Nst/echo results    Chest Pain  This is a chronic problem. The current episode started in the past 7 days.      Past Medical History:  Diagnosis Date   Asthma    Atrial fibrillation (HCC)    Bicuspid aortic valve    CHF (congestive heart failure) (HCC)    COPD (chronic obstructive pulmonary disease) ([place])    Depression    Diabetes mellitus without complication ([place])    Hypertension      Past Surgical History:  Procedure Laterality Date   INTRAVASCULAR PRESSURE WIRE/FFR STUDY N/A 04/27/2022   Procedure: INTRAVASCULAR PRESSURE WIRE/FFR STUDY;  Surgeon: Wellington Hampshire, MD;  Location: [facility];  Service: Cardiovascular;  Laterality: N/A;   LEFT HEART CATH AND CORONARY ANGIOGRAPHY N/A 04/27/2022   Procedure: LEFT HEART CATH AND CORONARY ANGIOGRAPHY with  possible coronary intervention;  Surgeon: Dionisio Cordelro, MD;  Location: [place] [facility];  Service: Cardiovascular;  Laterality: N/A;   toe amputations       Current Outpatient Medications  Medication Sig Dispense Refill   albuterol (VENTOLIN HFA) 108 (90 Base) MCG/ACT inhaler INHALE 2 PUFFS BY ORAL INHALATION EVERY 4 TO 6 HOURS AS NEEDED FOR CHRONIC OBSTRUCTIVE LUNG DISEASE FOR BREATHING. BE SURE TO WASH MOUTHPIECE WITH WARM WATER ONCE A WEEK     atorvastatin (LIPITOR) 80 MG tablet Take 1 tablet (80 mg total) by mouth daily. 90 tablet 3   dapagliflozin propanediol (FARXIGA) 5 MG TABS tablet Take 2 tablets (10 mg total) by mouth daily before breakfast. 30 tablet 2   furosemide (LASIX) 40 MG tablet Take 1 tablet (40 mg total) by mouth 2 (two) times daily. 30 tablet 11   gabapentin (NEURONTIN) 300 MG  capsule Take 1 capsule (300 mg total) by mouth 3 (three) times daily as needed. 90 capsule 0   losartan (COZAAR) 25 MG tablet Take 1 tablet (25 mg total) by mouth daily. 30 tablet 11   methadone (DOLOPHINE) 10 MG tablet Take 1 tablet by mouth 2 (two) times daily.     metoprolol succinate (TOPROL XL) 50 MG 24 hr tablet Take 1 tablet (50 mg total) by mouth daily. Take with or immediately following a meal. 30 tablet 11   nitroGLYCERIN (NITROSTAT) 0.4 MG SL tablet Place 1 tablet (0.4 mg total) under the tongue every 5 (five) minutes x 3 doses as needed for chest pain. 30 tablet 0   oxyCODONE (OXY IR/ROXICODONE) 5 MG immediate release tablet Take 10 mg by mouth every 4 (four) hours as needed for severe pain.     pantoprazole (PROTONIX) 40 MG tablet Take 1 tablet (40 mg total) by mouth daily. 30 tablet 1   ranolazine (RANEXA) 500 MG 12 hr tablet Take 1 tablet (500 mg total) by mouth 2 (two) times daily. 60 tablet 11   albuterol (PROVENTIL) (2.5 MG/3ML) 0.083% nebulizer solution 1 AMPULE BY ORAL INHALATION TWO TIMES A DAY AND 1 AMPULE ONCE EVERY DAY AS NEEDED FOR SHORTNESS OF BREATH (Patient not taking: Reported on 06/30/2022)     amiodarone (PACERONE) 200 MG tablet Take 1  tablet (200 mg total) by mouth daily. (Patient not taking: Reported on 06/30/2022) 30 tablet 2   apixaban (ELIQUIS) 5 MG TABS tablet Take 1 tablet (5 mg total) by mouth 2 (two) times daily. (Patient not taking: Reported on 06/30/2022) 60 tablet 0   clopidogrel (PLAVIX) 75 MG tablet Take 1 tablet (75 mg total) by mouth daily. 30 tablet 1   empagliflozin (JARDIANCE) 10 MG TABS tablet Take 1 tablet (10 mg total) by mouth daily. (Patient not taking: Reported on 06/30/2022) 30 tablet 2   fluticasone-salmeterol (ADVAIR) 250-50 MCG/ACT AEPB Inhale 1 puff into the lungs in the morning and at bedtime. (Patient not taking: Reported on 06/30/2022) 1 each 1   metFORMIN (GLUCOPHAGE) 500 MG tablet Take 500 mg by mouth 2 (two) times daily with a meal. (Patient  not taking: Reported on 06/30/2022)     midodrine (PROAMATINE) 10 MG tablet Take 1 tablet (10 mg total) by mouth 3 (three) times daily with meals. 90 tablet 1   potassium chloride SA (KLOR-CON M) 20 MEQ tablet Take 1 tablet (20 mEq total) by mouth daily. (Patient not taking: Reported on 06/30/2022) 30 tablet 1   thiamine (VITAMIN B-1) 100 MG tablet Take 1 tablet (100 mg total) by mouth daily. 30 tablet 3   tiotropium (SPIRIVA) 18 MCG inhalation capsule Place 1 capsule (18 mcg total) into inhaler and inhale daily. 30 capsule 1   Tiotropium Bromide-Olodaterol 2.5-2.5 MCG/ACT AERS INHALE 2 INHALATIONS BY ORAL INHALATION ONCE EVERY DAY (Patient not taking: Reported on 06/30/2022)     No current facility-administered medications for this visit.    Allergies:   Niacin and Penicillin g    Social History:   reports that he has been smoking cigarettes. He has been smoking an average of 1.5 packs per day. He has never used smokeless tobacco. He reports current alcohol use of about 2.0 - 3.0 standard drinks of alcohol per week. He reports current drug use. Drug: Marijuana.   Family History:  family history is not on file.    ROS:     Review of Systems  Constitutional: Negative.   HENT: Negative.    Eyes: Negative.   Respiratory: Negative.    Cardiovascular:  Positive for chest pain.  Gastrointestinal: Negative.   Genitourinary: Negative.   Musculoskeletal: Negative.   Skin: Negative.   Neurological: Negative.   Endo/Heme/Allergies: Negative.   Psychiatric/Behavioral: Negative.    All other systems reviewed and are negative.     All other systems are reviewed and negative.    PHYSICAL EXAM: VS:  BP 100/70   Pulse 65   Ht '5\' 10"'$  (1.778 m)   Wt 137 lb (62.1 kg)   SpO2 100%   BMI 19.66 kg/m  , BMI Body mass index is 19.66 kg/m. Last weight:  Wt Readings from Last 3 Encounters:  06/30/22 137 lb (62.1 kg)  06/22/22 134 lb 14.7 oz (61.2 kg)  06/16/22 137 lb (62.1 kg)     Physical  Exam Vitals reviewed.  Constitutional:      Appearance: Normal appearance. He is normal weight.  HENT:     Head: Normocephalic.     Nose: Nose normal.     Mouth/Throat:     Mouth: Mucous membranes are moist.  Eyes:     Pupils: Pupils are equal, round, and reactive to light.  Cardiovascular:     Rate and Rhythm: Normal rate and regular rhythm.     Pulses: Normal pulses.     Heart sounds:  Normal heart sounds.  Pulmonary:     Effort: Pulmonary effort is normal.  Abdominal:     General: Abdomen is flat. Bowel sounds are normal.  Musculoskeletal:        General: Normal range of motion.     Cervical back: Normal range of motion.  Skin:    General: Skin is warm.  Neurological:     General: No focal deficit present.     Mental Status: He is alert.  Psychiatric:        Mood and Affect: Mood normal.       EKG:   Recent Labs: 04/19/2022: TSH 1.545 05/04/2022: Magnesium 2.3 05/27/2022: ALT 21 06/22/2022: B Natriuretic Peptide 491.8; BUN 18; Creatinine, Ser 0.98; Hemoglobin 12.5; Platelets 376; Potassium 3.9; Sodium 136    Lipid Panel    Component Value Date/Time   CHOL 151 03/21/2022 0442   TRIG 82 03/21/2022 0442   HDL 39 (L) 03/21/2022 0442   CHOLHDL 3.9 03/21/2022 0442   VLDL 16 03/21/2022 0442   LDLCALC 96 03/21/2022 0442      Other studies Reviewed: Additional studies/ records that were reviewed today include:  Review of the above records demonstrates:       No data to display            ASSESSMENT AND PLAN:    ICD-10-CM   1. Acute combined systolic and diastolic congestive heart failure (HCC)  I50.41     2. Acute on chronic combined systolic and diastolic CHF (congestive heart failure) (HCC)  I50.43     3. Nonrheumatic aortic valve stenosis  I35.0    moderate to severe AS, mild to moderate MR/AR    4. Chronic atrial fibrillation (HCC)  I48.20     5. Essential hypertension  I10     6. NSTEMI (non-ST elevated myocardial infarction) ([place])  I21.4      7. Dyslipidemia  E78.5        Problem List Items Addressed This Visit       Cardiovascular and Mediastinum   NSTEMI (non-ST elevated myocardial infarction) (HCC)   Relevant Medications   metoprolol succinate (TOPROL XL) 50 MG 24 hr tablet   Acute CHF (congestive heart failure) (HCC) - Primary   Relevant Medications   metoprolol succinate (TOPROL XL) 50 MG 24 hr tablet   Acute on chronic combined systolic and diastolic CHF (congestive heart failure) (HCC)   Relevant Medications   metoprolol succinate (TOPROL XL) 50 MG 24 hr tablet   Essential hypertension   Relevant Medications   metoprolol succinate (TOPROL XL) 50 MG 24 hr tablet   Chronic atrial fibrillation (HCC)   Relevant Medications   metoprolol succinate (TOPROL XL) 50 MG 24 hr tablet   Aortic valve stenosis    Has EF 48%, moderate to severe AS, moderate MR/AR. May need cath for AVR      Relevant Medications   metoprolol succinate (TOPROL XL) 50 MG 24 hr tablet     Other   Dyslipidemia       Disposition:   No follow-ups on file.    Total time spent: 45 minutes  Signed,  Neoma Laming, MD  06/30/2022 11:45 AM    [place]

## 2022-07-07 ENCOUNTER — Ambulatory Visit (INDEPENDENT_AMBULATORY_CARE_PROVIDER_SITE_OTHER): Payer: Medicare PPO | Admitting: Cardiovascular Disease

## 2022-07-07 ENCOUNTER — Encounter: Payer: Self-pay | Admitting: Cardiovascular Disease

## 2022-07-07 VITALS — BP 104/62 | HR 77 | Ht 70.0 in | Wt 143.4 lb

## 2022-07-07 DIAGNOSIS — I350 Nonrheumatic aortic (valve) stenosis: Secondary | ICD-10-CM | POA: Diagnosis not present

## 2022-07-07 DIAGNOSIS — I480 Paroxysmal atrial fibrillation: Secondary | ICD-10-CM | POA: Diagnosis not present

## 2022-07-07 DIAGNOSIS — I5021 Acute systolic (congestive) heart failure: Secondary | ICD-10-CM

## 2022-07-07 DIAGNOSIS — E785 Hyperlipidemia, unspecified: Secondary | ICD-10-CM

## 2022-07-07 DIAGNOSIS — I482 Chronic atrial fibrillation, unspecified: Secondary | ICD-10-CM

## 2022-07-07 DIAGNOSIS — I10 Essential (primary) hypertension: Secondary | ICD-10-CM

## 2022-07-07 MED ORDER — AMIODARONE HCL 200 MG PO TABS: 200.0000 mg | ORAL_TABLET | Freq: Every day | ORAL | 1 refills | Status: AC

## 2022-07-07 MED ORDER — DAPAGLIFLOZIN PROPANEDIOL 5 MG PO TABS: 10.0000 mg | ORAL_TABLET | Freq: Every day | ORAL | 2 refills | Status: DC

## 2022-07-07 NOTE — Progress Notes (Signed)
Cardiology Office Note   Date:  07/07/2022   ID:  Jason Beck, DOB 08/03/1955, MRN [id number]  PCP:  [facility] [place]  Cardiologist:  Neoma Laming, MD      History of Present Illness: Jason Beck is a 67 y.o. male who presents for  Chief Complaint  Patient presents with   Follow-up    1 week follow up    Patient in office for 1 week follow up. Denies chest pain.Patient complaining of shortness of breath, coughing.     Past Medical History:  Diagnosis Date   Asthma    Atrial fibrillation (HCC)    Bicuspid aortic valve    CHF (congestive heart failure) (HCC)    COPD (chronic obstructive pulmonary disease) ([place])    Depression    Diabetes mellitus without complication ([place])    Hypertension      Past Surgical History:  Procedure Laterality Date   INTRAVASCULAR PRESSURE WIRE/FFR STUDY N/A 04/27/2022   Procedure: INTRAVASCULAR PRESSURE WIRE/FFR STUDY;  Surgeon: Wellington Hampshire, MD;  Location: [facility];  Service: Cardiovascular;  Laterality: N/A;   LEFT HEART CATH AND CORONARY ANGIOGRAPHY N/A 04/27/2022   Procedure: LEFT HEART CATH AND CORONARY ANGIOGRAPHY with  possible coronary intervention;  Surgeon: Dionisio Jessie, MD;  Location: [facility];  Service: Cardiovascular;  Laterality: N/A;   toe amputations       Current Outpatient Medications  Medication Sig Dispense Refill   albuterol (PROVENTIL) (2.5 MG/3ML) 0.083% nebulizer solution      albuterol (VENTOLIN HFA) 108 (90 Base) MCG/ACT inhaler INHALE 2 PUFFS BY ORAL INHALATION EVERY 4 TO 6 HOURS AS NEEDED FOR CHRONIC OBSTRUCTIVE LUNG DISEASE FOR BREATHING. BE SURE TO WASH MOUTHPIECE WITH WARM WATER ONCE A WEEK     apixaban (ELIQUIS) 5 MG TABS tablet Take 1 tablet (5 mg total) by mouth 2 (two) times daily. 60 tablet 0   atorvastatin (LIPITOR) 80 MG tablet Take 1 tablet (80 mg total) by mouth daily. 90 tablet 3   clopidogrel (PLAVIX) 75 MG tablet Take 1 tablet (75 mg total) by  mouth daily. 30 tablet 1   fluticasone-salmeterol (ADVAIR) 250-50 MCG/ACT AEPB Inhale 1 puff into the lungs in the morning and at bedtime. 1 each 1   furosemide (LASIX) 40 MG tablet Take 1 tablet (40 mg total) by mouth 2 (two) times daily. 30 tablet 11   gabapentin (NEURONTIN) 300 MG capsule Take 1 capsule (300 mg total) by mouth 3 (three) times daily as needed. 90 capsule 0   losartan (COZAAR) 25 MG tablet Take 1 tablet (25 mg total) by mouth daily. 30 tablet 11   metFORMIN (GLUCOPHAGE) 500 MG tablet Take 500 mg by mouth 2 (two) times daily with a meal.     methadone (DOLOPHINE) 10 MG tablet Take 1 tablet by mouth 2 (two) times daily.     metoprolol succinate (TOPROL XL) 50 MG 24 hr tablet Take 1 tablet (50 mg total) by mouth daily. Take with or immediately following a meal. 30 tablet 11   midodrine (PROAMATINE) 10 MG tablet Take 1 tablet (10 mg total) by mouth 3 (three) times daily with meals. 90 tablet 1   nitroGLYCERIN (NITROSTAT) 0.4 MG SL tablet Place 1 tablet (0.4 mg total) under the tongue every 5 (five) minutes x 3 doses as needed for chest pain. 30 tablet 0   oxyCODONE (OXY IR/ROXICODONE) 5 MG immediate release tablet Take 10 mg by mouth every 4 (  four) hours as needed for severe pain.     pantoprazole (PROTONIX) 40 MG tablet Take 1 tablet (40 mg total) by mouth daily. 30 tablet 1   potassium chloride SA (KLOR-CON M) 20 MEQ tablet Take 1 tablet (20 mEq total) by mouth daily. 30 tablet 1   ranolazine (RANEXA) 500 MG 12 hr tablet Take 1 tablet (500 mg total) by mouth 2 (two) times daily. 60 tablet 11   thiamine (VITAMIN B-1) 100 MG tablet Take 1 tablet (100 mg total) by mouth daily. 30 tablet 3   tiotropium (SPIRIVA) 18 MCG inhalation capsule Place 1 capsule (18 mcg total) into inhaler and inhale daily. 30 capsule 1   Tiotropium Bromide-Olodaterol 2.5-2.5 MCG/ACT AERS      amiodarone (PACERONE) 200 MG tablet Take 1 tablet (200 mg total) by mouth daily. 90 tablet 1   dapagliflozin propanediol  (FARXIGA) 5 MG TABS tablet Take 2 tablets (10 mg total) by mouth daily before breakfast. 30 tablet 2   No current facility-administered medications for this visit.    Allergies:   Niacin and Penicillin g    Social History:   reports that he has been smoking cigarettes. He has been smoking an average of 1.5 packs per day. He has never used smokeless tobacco. He reports current alcohol use of about 2.0 - 3.0 standard drinks of alcohol per week. He reports current drug use. Drug: Marijuana.   Family History:  family history is not on file.    ROS:     Review of Systems  Constitutional: Negative.   HENT: Negative.    Eyes: Negative.   Respiratory:  Positive for shortness of breath.   Cardiovascular: Negative.   Gastrointestinal: Negative.   Genitourinary: Negative.   Musculoskeletal: Negative.   Skin: Negative.   Neurological: Negative.   Endo/Heme/Allergies: Negative.   Psychiatric/Behavioral: Negative.    All other systems reviewed and are negative.   All other systems are reviewed and negative.   PHYSICAL EXAM: VS:  BP 104/62   Pulse 77   Ht 5\' 10"  (1.778 m)   Wt 143 lb 6.4 oz (65 kg)   SpO2 96%   BMI 20.58 kg/m  , BMI Body mass index is 20.58 kg/m. Last weight:  Wt Readings from Last 3 Encounters:  07/07/22 143 lb 6.4 oz (65 kg)  06/30/22 137 lb (62.1 kg)  06/22/22 134 lb 14.7 oz (61.2 kg)   Physical Exam Vitals reviewed.  Constitutional:      Appearance: Normal appearance. He is normal weight.  HENT:     Head: Normocephalic.     Nose: Nose normal.     Mouth/Throat:     Mouth: Mucous membranes are moist.  Eyes:     Pupils: Pupils are equal, round, and reactive to light.  Cardiovascular:     Rate and Rhythm: Normal rate and regular rhythm.     Pulses: Normal pulses.     Heart sounds: Normal heart sounds.  Pulmonary:     Effort: Pulmonary effort is normal.  Abdominal:     General: Abdomen is flat. Bowel sounds are normal.  Musculoskeletal:         General: Normal range of motion.     Cervical back: Normal range of motion.  Skin:    General: Skin is warm.  Neurological:     General: No focal deficit present.     Mental Status: He is alert.  Psychiatric:        Mood and Affect: Mood normal.  EKG: none today  Recent Labs: 04/19/2022: TSH 1.545 05/04/2022: Magnesium 2.3 05/27/2022: ALT 21 06/22/2022: B Natriuretic Peptide 491.8; BUN 18; Creatinine, Ser 0.98; Hemoglobin 12.5; Platelets 376; Potassium 3.9; Sodium 136    Lipid Panel    Component Value Date/Time   CHOL 151 03/21/2022 0442   TRIG 82 03/21/2022 0442   HDL 39 (L) 03/21/2022 0442   CHOLHDL 3.9 03/21/2022 0442   VLDL 16 03/21/2022 0442   LDLCALC 96 03/21/2022 0442     Other studies Reviewed: none today   ASSESSMENT AND PLAN:    ICD-10-CM   1. Acute systolic congestive heart failure (HCC)  I50.21 dapagliflozin propanediol (FARXIGA) 5 MG TABS tablet    2. Paroxysmal atrial fibrillation with RVR (HCC)  I48.0 amiodarone (PACERONE) 200 MG tablet    3. Nonrheumatic aortic valve stenosis  I35.0     4. Chronic atrial fibrillation (HCC)  I48.20     5. Essential hypertension  I10     6. Dyslipidemia  E78.5        Problem List Items Addressed This Visit       Cardiovascular and Mediastinum   Acute CHF (congestive heart failure) ([place]) - Primary    Patient feeling better today. Denies chest pain. Shortness of breath stable. Has not started [place], too expensive. Samples given, Rx sent to the [place]. Taking second Lasix as needed.       Relevant Medications   dapagliflozin propanediol (FARXIGA) 5 MG TABS tablet   amiodarone (PACERONE) 200 MG tablet   Paroxysmal atrial fibrillation with RVR (HCC)   Relevant Medications   amiodarone (PACERONE) 200 MG tablet   Essential hypertension   Relevant Medications   amiodarone (PACERONE) 200 MG tablet   Chronic atrial fibrillation (HCC)   Relevant Medications   amiodarone (PACERONE) 200 MG tablet   Aortic valve  stenosis   Relevant Medications   amiodarone (PACERONE) 200 MG tablet     Other   Dyslipidemia      Disposition:   Return in about 4 weeks (around 08/04/2022).    Total time spent: 30 minutes  Signed,  Neoma Laming, MD  07/07/2022 11:33 AM    [place]

## 2022-07-07 NOTE — Assessment & Plan Note (Signed)
Patient feeling better today. Denies chest pain. Shortness of breath stable. Has not started [place], too expensive. Samples given, Rx sent to the [place]. Taking second Lasix as needed.

## 2022-07-10 ENCOUNTER — Encounter: Payer: No Typology Code available for payment source | Admitting: Cardiology

## 2022-07-12 ENCOUNTER — Emergency Department
Admission: EM | Admit: 2022-07-12 | Discharge: 2022-07-12 | Disposition: A | Discharge status: Home / Self Care | Payer: No Typology Code available for payment source | Attending: Emergency Medicine | Admitting: Emergency Medicine

## 2022-07-12 ENCOUNTER — Emergency Department: Payer: No Typology Code available for payment source

## 2022-07-12 ENCOUNTER — Other Ambulatory Visit: Payer: Self-pay

## 2022-07-12 DIAGNOSIS — R0602 Shortness of breath: Secondary | ICD-10-CM | POA: Diagnosis present

## 2022-07-12 DIAGNOSIS — Z1152 Encounter for screening for COVID-19: Secondary | ICD-10-CM | POA: Insufficient documentation

## 2022-07-12 DIAGNOSIS — R911 Solitary pulmonary nodule: Secondary | ICD-10-CM | POA: Insufficient documentation

## 2022-07-12 DIAGNOSIS — Z85118 Personal history of other malignant neoplasm of bronchus and lung: Secondary | ICD-10-CM | POA: Insufficient documentation

## 2022-07-12 DIAGNOSIS — J40 Bronchitis, not specified as acute or chronic: Secondary | ICD-10-CM | POA: Insufficient documentation

## 2022-07-12 DIAGNOSIS — J449 Chronic obstructive pulmonary disease, unspecified: Secondary | ICD-10-CM | POA: Diagnosis not present

## 2022-07-12 LAB — CBC WITH DIFFERENTIAL/PLATELET
Abs Immature Granulocytes: 0.04 10*3/uL (ref 0.00–0.07)
Basophils Absolute: 0 10*3/uL (ref 0.0–0.1)
Basophils Relative: 0 %
Eosinophils Absolute: 0.3 10*3/uL (ref 0.0–0.5)
Eosinophils Relative: 3 %
HCT: 43.6 % (ref 39.0–52.0)
Hemoglobin: 13.7 g/dL (ref 13.0–17.0)
Immature Granulocytes: 0 %
Lymphocytes Relative: 23 %
Lymphs Abs: 2.1 10*3/uL (ref 0.7–4.0)
MCH: 26.2 pg (ref 26.0–34.0)
MCHC: 31.4 g/dL (ref 30.0–36.0)
MCV: 83.5 fL (ref 80.0–100.0)
Monocytes Absolute: 0.7 10*3/uL (ref 0.1–1.0)
Monocytes Relative: 8 %
Neutro Abs: 6.1 10*3/uL (ref 1.7–7.7)
Neutrophils Relative %: 66 %
Platelets: 273 10*3/uL (ref 150–400)
RBC: 5.22 MIL/uL (ref 4.22–5.81)
RDW: 24 % — ABNORMAL HIGH (ref 11.5–15.5)
WBC: 9.2 10*3/uL (ref 4.0–10.5)
nRBC: 0 % (ref 0.0–0.2)

## 2022-07-12 LAB — COMPREHENSIVE METABOLIC PANEL
ALT: 16 U/L (ref 0–44)
AST: 20 U/L (ref 15–41)
Albumin: 3.9 g/dL (ref 3.5–5.0)
Alkaline Phosphatase: 58 U/L (ref 38–126)
Anion gap: 10 (ref 5–15)
BUN: 15 mg/dL (ref 8–23)
CO2: 28 mmol/L (ref 22–32)
Calcium: 9.4 mg/dL (ref 8.9–10.3)
Chloride: 98 mmol/L (ref 98–111)
Creatinine, Ser: 0.94 mg/dL (ref 0.61–1.24)
GFR, Estimated: >60 mL/min (ref >60)
Glucose, Bld: 125 mg/dL — ABNORMAL HIGH (ref 70–99)
Potassium: 3.6 mmol/L (ref 3.5–5.1)
Sodium: 136 mmol/L (ref 135–145)
Total Bilirubin: 0.8 mg/dL (ref 0.3–1.2)
Total Protein: 7.5 g/dL (ref 6.5–8.1)

## 2022-07-12 LAB — TROPONIN I (HIGH SENSITIVITY)
Troponin I (High Sensitivity): 10 ng/L (ref <18)
Troponin I (High Sensitivity): 10 ng/L (ref <18)

## 2022-07-12 LAB — RESP PANEL BY RT-PCR (RSV, FLU A&B, COVID)  RVPGX2
Influenza A by PCR: NEGATIVE
Influenza B by PCR: NEGATIVE
Resp Syncytial Virus by PCR: NEGATIVE
SARS Coronavirus 2 by RT PCR: NEGATIVE

## 2022-07-12 LAB — BRAIN NATRIURETIC PEPTIDE: B Natriuretic Peptide: 369.7 pg/mL — ABNORMAL HIGH (ref 0.0–100.0)

## 2022-07-12 MED ORDER — METHYLPREDNISOLONE SODIUM SUCC 125 MG IJ SOLR
125.0000 mg | Freq: Once | INTRAMUSCULAR | Status: AC
  Administered 2022-07-12: 125 mg via INTRAVENOUS
  Filled 2022-07-12: qty 2

## 2022-07-12 MED ORDER — DOXYCYCLINE MONOHYDRATE 100 MG PO TABS: 100.0000 mg | ORAL_TABLET | Freq: Two times a day (BID) | ORAL | 0 refills | Status: AC

## 2022-07-12 MED ORDER — IOHEXOL 350 MG/ML SOLN
75.0000 mL | Freq: Once | INTRAVENOUS | Status: AC | PRN
  Administered 2022-07-12: 75 mL via INTRAVENOUS

## 2022-07-12 MED ORDER — IPRATROPIUM-ALBUTEROL 0.5-2.5 (3) MG/3ML IN SOLN
3.0000 mL | Freq: Once | RESPIRATORY_TRACT | Status: AC
  Administered 2022-07-12: 3 mL via RESPIRATORY_TRACT
  Filled 2022-07-12: qty 3

## 2022-07-12 MED ORDER — PREDNISONE 20 MG PO TABS: 40.0000 mg | ORAL_TABLET | Freq: Every day | ORAL | 0 refills | Status: AC

## 2022-07-12 NOTE — ED Provider Notes (Signed)
[facility] Provider Note    Event Date/Time   First MD Initiated Contact with Patient 07/12/22 [phone]     (approximate)   History   Shortness of Breath   HPI  Jason Beck is a 67 y.o. male with history of COPD who comes in with concerns for shortness of breath.  He states that he stopped to developing shortness of breath yesterday.  Denies any chest pain or abdominal pain.  Denies any falls in his head.  Does report some mild coughing.  He takes Lasix at home.  Patient is on a blood thinner, Eliquis previously but on prior records it sounds like he was switched to Plavix due to inability to afford Eliquis.  Patient is not certain to what he is currently taking but his family members coming.  Patient does report having a history of lung cancer that is currently being watched.  He states that he did go through radiation therapy 2 years ago but given its small size and inability to biopsy they are just continuing to monitor it at this time.   Physical Exam   Triage Vital Signs: ED Triage Vitals  Enc Vitals Group     BP      Pulse      Resp      Temp      Temp src      SpO2      Weight      Height      Head Circumference      Peak Flow      Pain Score      Pain Loc      Pain Edu?      Excl. in [place]?     Most recent vital signs: Vitals:   07/12/22 0830 07/12/22 0900  BP: (!) 143/75 (!) 142/83  Pulse: 78 (!) 115  Resp: 18 14  Temp:    SpO2: 100% 95%     General: Awake, no distress.  CV:  Good peripheral perfusion.  Resp:  Normal effort.  Abd:  No distention.  Soft nontender Other:  Some tight air exchange but no obvious wheezing noted.  No swelling in legs.  No calf tenderness.  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   ED Results / Procedures / Treatments   Labs (all labs ordered are listed, but only abnormal results are displayed) Labs Reviewed  CBC WITH DIFFERENTIAL/PLATELET -  Abnormal; Notable for the following components:      Result Value   RDW 24.0 (*)    All other components within normal limits  COMPREHENSIVE METABOLIC PANEL - Abnormal; Notable for the following components:   Glucose, Bld 125 (*)    All other components within normal limits  BRAIN NATRIURETIC PEPTIDE - Abnormal; Notable for the following components:   B Natriuretic Peptide 369.7 (*)    All other components within normal limits  RESP PANEL BY RT-PCR (RSV, FLU A&B, COVID)  [id number]  TROPONIN I (HIGH SENSITIVITY)  TROPONIN I (HIGH SENSITIVITY)     EKG  My interpretation of EKG:  Atrial flutter  rate of 85 without any ST elevation or T wave inversions, qtc long similar to prior from afluuter/fib  RADIOLOGY I have reviewed the xray personally and interpreted and patient has a lung nodule noted  PROCEDURES:  Critical Care performed: No  .1-3 Lead EKG Interpretation  Performed by: Vanessa Ione, MD Authorized by: Jari Pigg,  Royetta Crochet, MD     Interpretation: normal     ECG rate:  80   ECG rate assessment: normal     Rhythm: atrial flutter     Ectopy: none     Conduction: normal      MEDICATIONS ORDERED IN ED: Medications  ipratropium-albuterol (DUONEB) 0.5-2.5 (3) MG/3ML nebulizer solution 3 mL (3 mLs Nebulization Given 07/12/22 0814)  ipratropium-albuterol (DUONEB) 0.5-2.5 (3) MG/3ML nebulizer solution 3 mL (3 mLs Nebulization Given 07/12/22 0814)  ipratropium-albuterol (DUONEB) 0.5-2.5 (3) MG/3ML nebulizer solution 3 mL (3 mLs Nebulization Given 07/12/22 0814)  methylPREDNISolone sodium succinate (SOLU-MEDROL) 125 mg/2 mL injection 125 mg (125 mg Intravenous Given 07/12/22 0814)     IMPRESSION / MDM / ASSESSMENT AND PLAN / ED COURSE  I reviewed the triage vital signs and the nursing notes.   Patient's presentation is most consistent with acute presentation with potential threat to life or bodily function.   Patient comes in with concerns for shortness of breath.  Lungs overall  are reassuring but some tight air exchange.  Will trial some DuoNebs, steroids.  Unclear what blood thinner he is on so we will consider pulmonary embolism based upon further workup and discussion with his family member.  Labs ordered evaluate for ACS.  BNP is lower than his baseline.  CBC reassuring.  Initial troponin is negative CMP reassuring.  Reevaluated patient on room air states that he is feeling better but still having some shortness of breath I reevaluated him we discussed CT imaging to rule out any other cause of the shortness of breath such as PE, worsening lung mass, pneumonia.  He would like to proceed with the CT imaging.  He denies any falls or any headache to suggest needing the CT head.  Patient ifamily members now bedside he does report that he is on Eliquis and has been compliant with him.   IMPRESSION: 1. No filling defect is identified in the pulmonary arterial tree to suggest pulmonary embolus. 2. Mildly cavitary 1.7 by 1.0 by 1.3 cm left upper lobe nodule with surrounding bandlike opacity and surrounding hazy ground-glass opacity. This nodule is stable in size from 04/26/2022 and directly aligned with the B3B bronchus. Possibilities include atypical infectious process such as fungal infection, or low-grade malignancy. PET-CT and/or biopsy likely warranted given the persistence of this lesion. 3. Airway thickening is present, suggesting bronchitis or reactive airways disease. Airway plugging is present in both lower lobes. 4. 1.3 by 1.1 cm exophytic lesion from the left kidney upper pole anteriorly, internal density 60 Hounsfield units, indeterminate, size is stable from 04/26/2022. Follow up renal protocol MRI or CT with and without contrast is recommended for definitive characterization. 5. Aortic and mitral valve calcification. 6. Aortic and coronary atherosclerosis. 7. Emphysema.  I suspect patient's symptoms is most likely from the bronchitis noted on the CT  scan with airway plugging.  I did discuss with Dr. Grayland Ormond from oncology and for the concern for the cavitary lesion this can be followed up outpatient with his [place] oncologist.  I did discuss with infectious disease Dr. Gale Journey to see if patient would need inpatient admission for biopsy to rule out a fungal infection but given his vitals are otherwise stable and this has been present for 3 months this can also be dealt with outpatient.  I discussed all the incidental findings with patient on the CT imaging provided a copy of report.  Patient is had normal ambulatory sat.  His repeat troponin is negative.  We discussed and considered admission for patient.  We discussed that this is most likely the bronchitis could be what is contributing to known aortic stenosis however given his vitals are stable they are unlikely to do an emergent procedure.  I did send a message to his cardiologist to get close follow-up.  At this time patient declines admission would like to trial discharge home with his nebs that he has at home, steroids, doxy. He understands that if he develops worsening shortness of breath that he needs to return to the ER immediately  The patient is on the cardiac monitor to evaluate for evidence of arrhythmia and/or significant heart rate changes.      FINAL CLINICAL IMPRESSION(S) / ED DIAGNOSES   Final diagnoses:  Bronchitis  Lung nodule     Rx / DC Orders   ED Discharge Orders          Ordered    predniSONE (DELTASONE) 20 MG tablet  Daily with breakfast        07/12/22 1120    doxycycline (ADOXA) 100 MG tablet  2 times daily        07/12/22 1120             Note:  This document was prepared using Dragon voice recognition software and may include unintentional dictation errors.   Vanessa Schleswig, MD 07/12/22 [phone]

## 2022-07-12 NOTE — Discharge Instructions (Addendum)
Take the steroids, doxy to help with any inflammation and use your albuterol neb every 4-6 hours.  Return to the ER if you develop worsening shortness of breath or any other concerns.  Otherwise [facility] with [facility] for the incidental findings below as well as follow-up with your cardiologist for your known stenosis.   IMPRESSION: 1. No filling defect is identified in the pulmonary arterial tree to suggest pulmonary embolus. 2. Mildly cavitary 1.7 by 1.0 by 1.3 cm left upper lobe nodule with surrounding bandlike opacity and surrounding hazy ground-glass opacity. This nodule is stable in size from 04/26/2022 and directly aligned with the B3B bronchus. Possibilities include atypical infectious process such as fungal infection, or low-grade malignancy. PET-CT and/or biopsy likely warranted given the persistence of this lesion. 3. Airway thickening is present, suggesting bronchitis or reactive airways disease. Airway plugging is present in both lower lobes. 4. 1.3 by 1.1 cm exophytic lesion from the left kidney upper pole anteriorly, internal density 60 Hounsfield units, indeterminate, size is stable from 04/26/2022. Follow up renal protocol MRI or CT with and without contrast is recommended for definitive characterization. 5. Aortic and mitral valve calcification. 6. Aortic and coronary atherosclerosis. 7. Emphysema.

## 2022-07-12 NOTE — ED Triage Notes (Signed)
Pt to ED via [facility] from home. Pt reports SOB that started yesterday. Pt with hx COPD. No oxygen use at home. Pt used inhaler PTA. EMS placed pt on 2L [place] for comfort.   EMS VS:  100% RA ET 30 CBG 157

## 2022-07-12 NOTE — ED Notes (Signed)
Pt assisted to bedside commode by EDT.

## 2022-07-12 NOTE — ED Notes (Signed)
Family at bedside. 

## 2022-07-12 NOTE — ED Notes (Signed)
Pt ambulated by this RN. RA sats maintained between 95-97%. Pt denies any complaints. MD made aware

## 2022-07-12 NOTE — ED Notes (Signed)
Pt transported to xray - will administer breathing tx upon return

## 2022-07-14 ENCOUNTER — Encounter: Payer: Self-pay | Admitting: Cardiovascular Disease

## 2022-07-16 ENCOUNTER — Telehealth: Payer: Self-pay

## 2022-07-16 ENCOUNTER — Other Ambulatory Visit: Payer: Self-pay | Admitting: Cardiovascular Disease

## 2022-07-16 DIAGNOSIS — I480 Paroxysmal atrial fibrillation: Secondary | ICD-10-CM

## 2022-07-16 MED ORDER — METOPROLOL SUCCINATE ER 50 MG PO TB24: 50.0000 mg | ORAL_TABLET | Freq: Every day | ORAL | 11 refills | Status: AC

## 2022-07-16 NOTE — Telephone Encounter (Signed)
Pt has a referral to our clinic from: 06/14/2022   Provider: Hinda Kehr, MD  Department: [place]   Pt has canceled his appt with A[facility] clinic 3 times in the past. Latest cancellation 07/10/22.  Spoke with pt to reschedule appt. Pt and wife stated that they don't want schedule further appts at this time.   Will route to Darylene Price, FNP. For FYI.

## 2022-07-23 ENCOUNTER — Encounter: Payer: No Typology Code available for payment source | Admitting: Cardiothoracic Surgery

## 2022-07-27 ENCOUNTER — Encounter: Payer: No Typology Code available for payment source | Admitting: Thoracic Surgery (Cardiothoracic Vascular Surgery)

## 2022-08-04 ENCOUNTER — Ambulatory Visit (INDEPENDENT_AMBULATORY_CARE_PROVIDER_SITE_OTHER): Payer: Medicare PPO | Admitting: Cardiovascular Disease

## 2022-08-04 ENCOUNTER — Encounter: Payer: Self-pay | Admitting: Cardiovascular Disease

## 2022-08-04 VITALS — BP 104/70 | HR 96 | Ht 70.0 in | Wt 144.4 lb

## 2022-08-04 DIAGNOSIS — I350 Nonrheumatic aortic (valve) stenosis: Secondary | ICD-10-CM | POA: Diagnosis not present

## 2022-08-04 DIAGNOSIS — R0602 Shortness of breath: Secondary | ICD-10-CM | POA: Diagnosis not present

## 2022-08-04 DIAGNOSIS — I482 Chronic atrial fibrillation, unspecified: Secondary | ICD-10-CM | POA: Diagnosis not present

## 2022-08-04 DIAGNOSIS — I5021 Acute systolic (congestive) heart failure: Secondary | ICD-10-CM

## 2022-08-04 DIAGNOSIS — I10 Essential (primary) hypertension: Secondary | ICD-10-CM

## 2022-08-04 DIAGNOSIS — I214 Non-ST elevation (NSTEMI) myocardial infarction: Secondary | ICD-10-CM

## 2022-08-04 NOTE — Assessment & Plan Note (Signed)
Continue amiodrone 200 daily

## 2022-08-04 NOTE — Progress Notes (Signed)
Cardiology Office Note   Date:  08/04/2022   ID:  Jason Beck, DOB 07-31-1955, MRN [id number]  PCP:  [facility]  Cardiologist:  Jason Blackwater, MD      History of Present Illness: Jason Beck is a 67 y.o. male who presents for  Chief Complaint  Patient presents with   Follow-up    1 month follow up    SOB much better.      Past Medical History:  Diagnosis Date   Asthma    Atrial fibrillation    Bicuspid aortic valve    CHF (congestive heart failure)    COPD (chronic obstructive pulmonary disease)    Depression    Diabetes mellitus without complication    Hypertension      Past Surgical History:  Procedure Laterality Date   CORONARY PRESSURE/FFR STUDY N/A 04/27/2022   Procedure: INTRAVASCULAR PRESSURE WIRE/FFR STUDY;  Surgeon: Jason Ouch, MD;  Location: [facility] INVASIVE CV LAB;  Service: Cardiovascular;  Laterality: N/A;   LEFT HEART CATH AND CORONARY ANGIOGRAPHY N/A 04/27/2022   Procedure: LEFT HEART CATH AND CORONARY ANGIOGRAPHY with  possible coronary intervention;  Surgeon: Jason [name], MD;  Location: [facility] INVASIVE CV LAB;  Service: Cardiovascular;  Laterality: N/A;   toe amputations       Current Outpatient Medications  Medication Sig Dispense Refill   albuterol (PROVENTIL) (2.5 MG/3ML) 0.083% nebulizer solution      albuterol (VENTOLIN HFA) 108 (90 Base) MCG/ACT inhaler INHALE 2 PUFFS BY ORAL INHALATION EVERY 4 TO 6 HOURS AS NEEDED FOR CHRONIC OBSTRUCTIVE LUNG DISEASE FOR BREATHING. BE SURE TO WASH MOUTHPIECE WITH WARM WATER ONCE A WEEK     amiodarone (PACERONE) 200 MG tablet Take 1 tablet (200 mg total) by mouth daily. 90 tablet 1   atorvastatin (LIPITOR) 80 MG tablet Take 1 tablet (80 mg total) by mouth daily. 90 tablet 3   ciclesonide (ALVESCO) 160 MCG/ACT inhaler Inhale 2 puffs into the lungs 2 (two) times daily.     clopidogrel (PLAVIX) 75 MG tablet Take 1 tablet (75 mg total) by mouth daily. 30 tablet 1   dapagliflozin  propanediol (FARXIGA) 5 MG TABS tablet Take 2 tablets (10 mg total) by mouth daily before breakfast. 30 tablet 2   docusate sodium (COLACE) 50 MG capsule Take 50 mg by mouth daily as needed for mild constipation.     furosemide (LASIX) 40 MG tablet Take 1 tablet (40 mg total) by mouth 2 (two) times daily. 30 tablet 11   losartan (COZAAR) 25 MG tablet Take 1 tablet (25 mg total) by mouth daily. (Patient taking differently: Take 12.5 mg by mouth daily.) 30 tablet 11   methadone (DOLOPHINE) 10 MG tablet Take 1 tablet by mouth 2 (two) times daily.     metoprolol succinate (TOPROL XL) 50 MG 24 hr tablet Take 1 tablet (50 mg total) by mouth daily. Take with or immediately following a meal. 30 tablet 11   midodrine (PROAMATINE) 10 MG tablet Take 1 tablet (10 mg total) by mouth 3 (three) times daily with meals. 90 tablet 1   nitroGLYCERIN (NITROSTAT) 0.4 MG SL tablet Place 1 tablet (0.4 mg total) under the tongue every 5 (five) minutes x 3 doses as needed for chest pain. 30 tablet 0   oxycodone (OXY-IR) 5 MG capsule Take 5 mg by mouth every 4 (four) hours as needed.     pantoprazole (PROTONIX) 40 MG tablet Take 1 tablet (40 mg total) by  mouth daily. 30 tablet 1   potassium chloride SA (KLOR-CON M) 20 MEQ tablet Take 1 tablet (20 mEq total) by mouth daily. 30 tablet 1   ranolazine (RANEXA) 500 MG 12 hr tablet Take 1 tablet (500 mg total) by mouth 2 (two) times daily. 60 tablet 11   thiamine (VITAMIN B-1) 100 MG tablet Take 1 tablet (100 mg total) by mouth daily. 30 tablet 3   Tiotropium Bromide-Olodaterol 2.5-2.5 MCG/ACT AERS      apixaban (ELIQUIS) 5 MG TABS tablet Take 1 tablet (5 mg total) by mouth 2 (two) times daily. 60 tablet 0   fluticasone-salmeterol (ADVAIR) 250-50 MCG/ACT AEPB Inhale 1 puff into the lungs in the morning and at bedtime. (Patient not taking: Reported on 08/04/2022) 1 each 1   gabapentin (NEURONTIN) 300 MG capsule Take 1 capsule (300 mg total) by mouth 3 (three) times daily as needed.  90 capsule 0   oxyCODONE (OXY IR/ROXICODONE) 5 MG immediate release tablet Take 10 mg by mouth every 4 (four) hours as needed for severe pain. (Patient not taking: Reported on 08/04/2022)     tiotropium (SPIRIVA) 18 MCG inhalation capsule Place 1 capsule (18 mcg total) into inhaler and inhale daily. 30 capsule 1   No current facility-administered medications for this visit.    Allergies:   Niacin and Penicillin g    Social History:   reports that he has been smoking cigarettes. He has been smoking an average of 1.5 packs per day. He has never used smokeless tobacco. He reports current alcohol use of about 2.0 - 3.0 standard drinks of alcohol per week. He reports current drug use. Drug: Marijuana.   Family History:  family history is not on file.    ROS:     Review of Systems  Constitutional: Negative.   HENT: Negative.    Eyes: Negative.   Respiratory: Negative.    Gastrointestinal: Negative.   Genitourinary: Negative.   Musculoskeletal: Negative.   Skin: Negative.   Neurological: Negative.   Endo/Heme/Allergies: Negative.   Psychiatric/Behavioral: Negative.    All other systems reviewed and are negative.     All other systems are reviewed and negative.    PHYSICAL EXAM: VS:  BP 104/70   Pulse 96   Ht  (1.778 m)   Wt 144 lb 6.4 oz (65.5 kg)   SpO2 98%   BMI 20.72 kg/m  , BMI Body mass index is 20.72 kg/m. Last weight:  Wt Readings from Last 3 Encounters:  08/04/22 144 lb 6.4 oz (65.5 kg)  07/12/22 143 lb 4.8 oz (65 kg)  07/07/22 143 lb 6.4 oz (65 kg)     Physical Exam Vitals reviewed.  Constitutional:      Appearance: Normal appearance. He is normal weight.  HENT:     Head: Normocephalic.     Nose: Nose normal.     Mouth/Throat:     Mouth: Mucous membranes are moist.  Eyes:     Pupils: Pupils are equal, round, and reactive to light.  Cardiovascular:     Rate and Rhythm: Normal rate and regular rhythm.     Pulses: Normal pulses.     Heart sounds:  Normal heart sounds.  Pulmonary:     Effort: Pulmonary effort is normal.  Abdominal:     General: Abdomen is flat. Bowel sounds are normal.  Musculoskeletal:        General: Normal range of motion.     Cervical back: Normal range of motion.  Skin:  General: Skin is warm.  Neurological:     General: No focal deficit present.     Mental Status: He is alert.  Psychiatric:        Mood and Affect: Mood normal.       EKG:   Recent Labs: 04/19/2022: TSH 1.545 05/04/2022: Magnesium 2.3 07/12/2022: ALT 16; B Natriuretic Peptide 369.7; BUN 15; Creatinine, Ser 0.94; Hemoglobin 13.7; Platelets 273; Potassium 3.6; Sodium 136    Lipid Panel    Component Value Date/Time   CHOL 151 03/21/2022 0442   TRIG 82 03/21/2022 0442   HDL 39 (L) 03/21/2022 0442   CHOLHDL 3.9 03/21/2022 0442   VLDL 16 03/21/2022 0442   LDLCALC 96 03/21/2022 0442      Other studies Reviewed: Additional studies/ records that were reviewed today include:  Review of the above records demonstrates:       No data to display            ASSESSMENT AND PLAN:    ICD-10-CM   1. SOB (shortness of breath)  R06.02    Has SOB improved    2. Acute systolic congestive heart failure  I50.21     3. Nonrheumatic aortic valve stenosis  I35.0     4. Chronic atrial fibrillation  I48.20     5. Essential hypertension  I10     6. NSTEMI (non-ST elevated myocardial infarction)  I21.4        Problem List Items Addressed This Visit       Cardiovascular and Mediastinum   NSTEMI (non-ST elevated myocardial infarction)   Acute CHF (congestive heart failure)    LVEF 35-40%, advise continue current meds.Losartan 12.5, lasix 40 bid, farxiga. BP low as on midorodine.      Essential hypertension   Chronic atrial fibrillation    Continue amiodrone 200 daily      Aortic valve stenosis   Other Visit Diagnoses     SOB (shortness of breath)    -  Primary   Has SOB improved          Disposition:   Return  in about 4 weeks (around 09/01/2022).    Total time spent: 30 minutes  Signed,  Jason Blackwater, MD  08/04/2022 12:04 PM    [facility]

## 2022-08-04 NOTE — Assessment & Plan Note (Addendum)
LVEF 35-40%, advise continue current meds.Losartan 12.5, lasix 40 bid, farxiga. BP low as on midorodine.

## 2022-08-09 ENCOUNTER — Encounter: Payer: Self-pay | Admitting: Cardiovascular Disease

## 2022-08-12 ENCOUNTER — Ambulatory Visit: Payer: Medicare PPO | Admitting: Cardiology

## 2022-08-24 ENCOUNTER — Other Ambulatory Visit: Payer: Self-pay | Admitting: Cardiovascular Disease

## 2022-09-07 ENCOUNTER — Ambulatory Visit (INDEPENDENT_AMBULATORY_CARE_PROVIDER_SITE_OTHER): Payer: Medicare PPO | Admitting: Cardiovascular Disease

## 2022-09-07 ENCOUNTER — Encounter: Payer: Self-pay | Admitting: Cardiovascular Disease

## 2022-09-07 VITALS — BP 90/60 | HR 108 | Ht 68.5 in | Wt 155.8 lb

## 2022-09-07 DIAGNOSIS — I482 Chronic atrial fibrillation, unspecified: Secondary | ICD-10-CM

## 2022-09-07 DIAGNOSIS — I4891 Unspecified atrial fibrillation: Secondary | ICD-10-CM

## 2022-09-07 DIAGNOSIS — I350 Nonrheumatic aortic (valve) stenosis: Secondary | ICD-10-CM | POA: Diagnosis not present

## 2022-09-07 DIAGNOSIS — J810 Acute pulmonary edema: Secondary | ICD-10-CM

## 2022-09-07 DIAGNOSIS — I214 Non-ST elevation (NSTEMI) myocardial infarction: Secondary | ICD-10-CM | POA: Diagnosis not present

## 2022-09-07 DIAGNOSIS — I5043 Acute on chronic combined systolic (congestive) and diastolic (congestive) heart failure: Secondary | ICD-10-CM

## 2022-09-07 DIAGNOSIS — I739 Peripheral vascular disease, unspecified: Secondary | ICD-10-CM

## 2022-09-07 DIAGNOSIS — F17210 Nicotine dependence, cigarettes, uncomplicated: Secondary | ICD-10-CM | POA: Diagnosis not present

## 2022-09-07 DIAGNOSIS — I5041 Acute combined systolic (congestive) and diastolic (congestive) heart failure: Secondary | ICD-10-CM

## 2022-09-07 MED ORDER — FUROSEMIDE 40 MG PO TABS: 40.0000 mg | ORAL_TABLET | Freq: Every day | ORAL | 11 refills | Status: AC

## 2022-09-07 NOTE — Progress Notes (Signed)
Cardiology Office Note   Date:  09/07/2022   ID:  Jason Beck, DOB 04/25/1955, MRN [id number]  PCP:  [facility]  Cardiologist:  Adrian Blackwater, MD      History of Present Illness: Jason Beck is a 67 y.o. male who presents for  Chief Complaint  Patient presents with   Follow-up    3 month follow up    Feeling dizzy as BP is low.      Past Medical History:  Diagnosis Date   Asthma    Atrial fibrillation (HCC)    Bicuspid aortic valve    CHF (congestive heart failure) (HCC)    COPD (chronic obstructive pulmonary disease) (HCC)    Depression    Diabetes mellitus without complication (HCC)    Hypertension      Past Surgical History:  Procedure Laterality Date   CORONARY PRESSURE/FFR STUDY N/A 04/27/2022   Procedure: INTRAVASCULAR PRESSURE WIRE/FFR STUDY;  Surgeon: Iran Ouch, MD;  Location: [facility] INVASIVE CV LAB;  Service: Cardiovascular;  Laterality: N/A;   LEFT HEART CATH AND CORONARY ANGIOGRAPHY N/A 04/27/2022   Procedure: LEFT HEART CATH AND CORONARY ANGIOGRAPHY with  possible coronary intervention;  Surgeon: Laurier [name], MD;  Location: [facility] INVASIVE CV [facility];  Service: Cardiovascular;  Laterality: N/A;   toe amputations       Current Outpatient Medications  Medication Sig Dispense Refill   albuterol (PROVENTIL) (2.5 MG/3ML) 0.083% nebulizer solution      albuterol (VENTOLIN HFA) 108 (90 Base) MCG/ACT inhaler INHALE 2 PUFFS BY ORAL INHALATION EVERY 4 TO 6 HOURS AS NEEDED FOR CHRONIC OBSTRUCTIVE LUNG DISEASE FOR BREATHING. BE SURE TO WASH MOUTHPIECE WITH WARM WATER ONCE A WEEK     amiodarone (PACERONE) 200 MG tablet Take 1 tablet (200 mg total) by mouth daily. 90 tablet 1   apixaban (ELIQUIS) 5 MG TABS tablet Take 1 tablet (5 mg total) by mouth 2 (two) times daily. 60 tablet 0   atorvastatin (LIPITOR) 80 MG tablet Take 1 tablet (80 mg total) by mouth daily. 90 tablet 3   ciclesonide (ALVESCO) 160 MCG/ACT inhaler Inhale 2 puffs into  the lungs 2 (two) times daily.     clopidogrel (PLAVIX) 75 MG tablet Take 1 tablet (75 mg total) by mouth daily. 30 tablet 1   docusate sodium (COLACE) 50 MG capsule Take 50 mg by mouth daily as needed for mild constipation.     empagliflozin (JARDIANCE) 10 MG TABS tablet Take 10 mg by mouth daily.     furosemide (LASIX) 40 MG tablet Take 1 tablet (40 mg total) by mouth 2 (two) times daily. 30 tablet 11   furosemide (LASIX) 40 MG tablet Take 1 tablet (40 mg total) by mouth daily. 30 tablet 11   gabapentin (NEURONTIN) 400 MG capsule Take 800 mg by mouth 3 (three) times daily.     losartan (COZAAR) 25 MG tablet Take 1 tablet (25 mg total) by mouth daily. (Patient taking differently: Take 12.5 mg by mouth daily.) 30 tablet 11   methadone (DOLOPHINE) 10 MG tablet Take 5 tablets by mouth 2 (two) times daily.     metoprolol succinate (TOPROL XL) 50 MG 24 hr tablet Take 1 tablet (50 mg total) by mouth daily. Take with or immediately following a meal. 30 tablet 11   midodrine (PROAMATINE) 10 MG tablet Take 1 tablet (10 mg total) by mouth 3 (three) times daily with meals. 90 tablet 1   nitroGLYCERIN (NITROSTAT) 0.4 MG  SL tablet Place 1 tablet (0.4 mg total) under the tongue every 5 (five) minutes x 3 doses as needed for chest pain. 30 tablet 0   oxyCODONE (OXY IR/ROXICODONE) 5 MG immediate release tablet Take 10 mg by mouth every 4 (four) hours as needed for severe pain.     oxycodone (OXY-IR) 5 MG capsule Take 5 mg by mouth every 4 (four) hours as needed.     pantoprazole (PROTONIX) 40 MG tablet TAKE 1 TABLET BY MOUTH EVERY DAY 30 tablet 1   potassium chloride SA (KLOR-CON M) 20 MEQ tablet Take 1 tablet (20 mEq total) by mouth daily. 30 tablet 1   ranolazine (RANEXA) 500 MG 12 hr tablet Take 1 tablet (500 mg total) by mouth 2 (two) times daily. 60 tablet 11   thiamine (VITAMIN B-1) 100 MG tablet Take 1 tablet (100 mg total) by mouth daily. 30 tablet 3   tiotropium (SPIRIVA) 18 MCG inhalation capsule Place  1 capsule (18 mcg total) into inhaler and inhale daily. 30 capsule 1   traZODone (DESYREL) 50 MG tablet Take 25 mg by mouth at bedtime.     Tiotropium Bromide-Olodaterol 2.5-2.5 MCG/ACT AERS  (Patient not taking: Reported on 09/07/2022)     No current facility-administered medications for this visit.    Allergies:   Niacin and Penicillin g    Social History:   reports that he has been smoking cigarettes. He has been smoking an average of 1.5 packs per day. He has never used smokeless tobacco. He reports current alcohol use of about 2.0 - 3.0 standard drinks of alcohol per week. He reports current drug use. Drug: Marijuana.   Family History:  family history is not on file.    ROS:     Review of Systems  Constitutional: Negative.   HENT: Negative.    Eyes: Negative.   Respiratory: Negative.    Gastrointestinal: Negative.   Genitourinary: Negative.   Musculoskeletal: Negative.   Skin: Negative.   Neurological: Negative.   Endo/Heme/Allergies: Negative.   Psychiatric/Behavioral: Negative.    All other systems reviewed and are negative.     All other systems are reviewed and negative.    PHYSICAL EXAM: VS:  BP 90/60   Pulse (!) 108   Ht 5' 8.5" (1.74 m)   Wt 155 lb 12.8 oz (70.7 kg)   SpO2 93%   BMI 23.34 kg/m  , BMI Body mass index is 23.34 kg/m. Last weight:  Wt Readings from Last 3 Encounters:  09/07/22 155 lb 12.8 oz (70.7 kg)  08/04/22 144 lb 6.4 oz (65.5 kg)  07/12/22 143 lb 4.8 oz (65 kg)     Physical Exam Vitals reviewed.  Constitutional:      Appearance: Normal appearance. He is normal weight.  HENT:     Head: Normocephalic.     Nose: Nose normal.     Mouth/Throat:     Mouth: Mucous membranes are moist.  Eyes:     Pupils: Pupils are equal, round, and reactive to light.  Cardiovascular:     Rate and Rhythm: Normal rate and regular rhythm.     Pulses: Normal pulses.     Heart sounds: Normal heart sounds.  Pulmonary:     Effort: Pulmonary effort is  normal.  Abdominal:     General: Abdomen is flat. Bowel sounds are normal.  Musculoskeletal:        General: Normal range of motion.     Cervical back: Normal range of motion.  Skin:  General: Skin is warm.  Neurological:     General: No focal deficit present.     Mental Status: He is alert.  Psychiatric:        Mood and Affect: Mood normal.       EKG:   Recent Labs: 04/19/2022: TSH 1.545 05/04/2022: Magnesium 2.3 07/12/2022: ALT 16; B Natriuretic Peptide 369.7; BUN 15; Creatinine, Ser 0.94; Hemoglobin 13.7; Platelets 273; Potassium 3.6; Sodium 136    Lipid Panel    Component Value Date/Time   CHOL 151 03/21/2022 0442   TRIG 82 03/21/2022 0442   HDL 39 (L) 03/21/2022 0442   CHOLHDL 3.9 03/21/2022 0442   VLDL 16 03/21/2022 0442   LDLCALC 96 03/21/2022 0442      Other studies Reviewed: Additional studies/ records that were reviewed today include:  Review of the above records demonstrates:       No data to display            ASSESSMENT AND PLAN:    ICD-10-CM   1. Acute on chronic combined systolic and diastolic CHF (congestive heart failure) (HCC)  I50.43     2. Nonrheumatic aortic valve stenosis  I35.0     3. Chronic atrial fibrillation (HCC)  I48.20     4. New onset atrial fibrillation (HCC)  I48.91     5. NSTEMI (non-ST elevated myocardial infarction) (HCC)  I21.4     6. PVD (peripheral vascular disease) (HCC)  I73.9     7. Acute pulmonary edema (HCC)  J81.0     8. Acute combined systolic and diastolic congestive heart failure (HCC)  I50.41        Problem List Items Addressed This Visit       Cardiovascular and Mediastinum   NSTEMI (non-ST elevated myocardial infarction) (HCC)   Relevant Medications   furosemide (LASIX) 40 MG tablet   Acute CHF (congestive heart failure) (HCC)    Has hypotension, and on lasix 40 bid, and is dry now,. Thus will decrease lasix 40 once a day and then may go down to 20 a day.      Relevant Medications    furosemide (LASIX) 40 MG tablet   New onset atrial fibrillation (HCC)   Relevant Medications   furosemide (LASIX) 40 MG tablet   Acute on chronic combined systolic and diastolic CHF (congestive heart failure) (HCC) - Primary   Relevant Medications   furosemide (LASIX) 40 MG tablet   PVD (peripheral vascular disease) (HCC)   Relevant Medications   furosemide (LASIX) 40 MG tablet   Chronic atrial fibrillation (HCC)   Relevant Medications   furosemide (LASIX) 40 MG tablet   Aortic valve stenosis   Relevant Medications   furosemide (LASIX) 40 MG tablet     Respiratory   Acute pulmonary edema (HCC)       Disposition:   Return in about 1 week (around 09/14/2022).    Total time spent: 30 minutes  Signed,  Adrian Blackwater, MD  09/07/2022 1:23 PM    [facility]

## 2022-09-07 NOTE — Assessment & Plan Note (Signed)
Has hypotension, and on lasix 40 bid, and is dry now,. Thus will decrease lasix 40 once a day and then may go down to 20 a day.

## 2022-09-14 ENCOUNTER — Ambulatory Visit (INDEPENDENT_AMBULATORY_CARE_PROVIDER_SITE_OTHER): Payer: Medicare PPO | Admitting: Cardiovascular Disease

## 2022-09-14 ENCOUNTER — Encounter: Payer: Self-pay | Admitting: Cardiovascular Disease

## 2022-09-14 VITALS — BP 106/68 | HR 94 | Ht 70.0 in | Wt 157.8 lb

## 2022-09-14 DIAGNOSIS — J810 Acute pulmonary edema: Secondary | ICD-10-CM | POA: Diagnosis not present

## 2022-09-14 DIAGNOSIS — I5041 Acute combined systolic (congestive) and diastolic (congestive) heart failure: Secondary | ICD-10-CM

## 2022-09-14 DIAGNOSIS — I350 Nonrheumatic aortic (valve) stenosis: Secondary | ICD-10-CM

## 2022-09-14 DIAGNOSIS — I4891 Unspecified atrial fibrillation: Secondary | ICD-10-CM

## 2022-09-14 DIAGNOSIS — I739 Peripheral vascular disease, unspecified: Secondary | ICD-10-CM | POA: Diagnosis not present

## 2022-09-14 NOTE — Progress Notes (Signed)
Cardiology Office Note   Date:  09/14/2022   ID:  Jason Beck, DOB [birth year], MRN [id number]  PCP:  [facility]  Cardiologist:  Adrian Blackwater, MD      History of Present Illness: Jason Beck is a 67 y.o. male who presents for  Chief Complaint  Patient presents with   Follow-up    1 week follow up    Shortness of Breath This is a recurrent problem. The current episode started 1 to 4 weeks ago. The problem has been rapidly worsening. Associated symptoms include PND, rhinorrhea and sputum production. Pertinent negatives include no chest pain or fever. The symptoms are aggravated by URIs. His past medical history is significant for a heart failure.      Past Medical History:  Diagnosis Date   Asthma    Atrial fibrillation (HCC)    Bicuspid aortic valve    CHF (congestive heart failure) (HCC)    COPD (chronic obstructive pulmonary disease) (HCC)    Depression    Diabetes mellitus without complication (HCC)    Hypertension      Past Surgical History:  Procedure Laterality Date   CORONARY PRESSURE/FFR STUDY N/A 04/27/2022   Procedure: INTRAVASCULAR PRESSURE WIRE/FFR STUDY;  Surgeon: Iran Ouch, MD;  Location: [facility];  Service: Cardiovascular;  Laterality: N/A;   LEFT HEART CATH AND CORONARY ANGIOGRAPHY N/A 04/27/2022   Procedure: LEFT HEART CATH AND CORONARY ANGIOGRAPHY with  possible coronary intervention;  Surgeon: Laurier [name], MD;  Location: [facility];  Service: Cardiovascular;  Laterality: N/A;   toe amputations       Current Outpatient Medications  Medication Sig Dispense Refill   albuterol (PROVENTIL) (2.5 MG/3ML) 0.083% nebulizer solution      albuterol (VENTOLIN HFA) 108 (90 Base) MCG/ACT inhaler INHALE 2 PUFFS BY ORAL INHALATION EVERY 4 TO 6 HOURS AS NEEDED FOR CHRONIC OBSTRUCTIVE LUNG DISEASE FOR BREATHING. BE SURE TO WASH MOUTHPIECE WITH WARM WATER ONCE A WEEK     amiodarone (PACERONE) 200 MG tablet  Take 1 tablet (200 mg total) by mouth daily. 90 tablet 1   atorvastatin (LIPITOR) 80 MG tablet Take 1 tablet (80 mg total) by mouth daily. 90 tablet 3   ciclesonide (ALVESCO) 160 MCG/ACT inhaler Inhale 2 puffs into the lungs 2 (two) times daily.     clopidogrel (PLAVIX) 75 MG tablet Take 1 tablet (75 mg total) by mouth daily. 30 tablet 1   docusate sodium (COLACE) 50 MG capsule Take 50 mg by mouth daily as needed for mild constipation.     empagliflozin (JARDIANCE) 10 MG TABS tablet Take 10 mg by mouth daily.     furosemide (LASIX) 40 MG tablet Take 1 tablet (40 mg total) by mouth 2 (two) times daily. 30 tablet 11   furosemide (LASIX) 40 MG tablet Take 1 tablet (40 mg total) by mouth daily. 30 tablet 11   gabapentin (NEURONTIN) 400 MG capsule Take 800 mg by mouth 3 (three) times daily.     losartan (COZAAR) 25 MG tablet Take 1 tablet (25 mg total) by mouth daily. (Patient taking differently: Take 12.5 mg by mouth daily.) 30 tablet 11   methadone (DOLOPHINE) 10 MG tablet Take 5 tablets by mouth 2 (two) times daily.     metoprolol succinate (TOPROL XL) 50 MG 24 hr tablet Take 1 tablet (50 mg total) by mouth daily. Take with or immediately following a meal. 30 tablet 11   midodrine (PROAMATINE)  10 MG tablet Take 1 tablet (10 mg total) by mouth 3 (three) times daily with meals. (Patient taking differently: Take 5 mg by mouth 3 (three) times daily with meals.) 90 tablet 1   nitroGLYCERIN (NITROSTAT) 0.4 MG SL tablet Place 1 tablet (0.4 mg total) under the tongue every 5 (five) minutes x 3 doses as needed for chest pain. 30 tablet 0   oxyCODONE (OXY IR/ROXICODONE) 5 MG immediate release tablet Take 10 mg by mouth every 4 (four) hours as needed for severe pain.     oxycodone (OXY-IR) 5 MG capsule Take 5 mg by mouth every 4 (four) hours as needed.     pantoprazole (PROTONIX) 40 MG tablet TAKE 1 TABLET BY MOUTH EVERY DAY 30 tablet 1   potassium chloride SA (KLOR-CON M) 20 MEQ tablet Take 1 tablet (20 mEq  total) by mouth daily. 30 tablet 1   ranolazine (RANEXA) 500 MG 12 hr tablet Take 1 tablet (500 mg total) by mouth 2 (two) times daily. 60 tablet 11   thiamine (VITAMIN B-1) 100 MG tablet Take 1 tablet (100 mg total) by mouth daily. 30 tablet 3   tiotropium (SPIRIVA) 18 MCG inhalation capsule Place 1 capsule (18 mcg total) into inhaler and inhale daily. 30 capsule 1   traZODone (DESYREL) 50 MG tablet Take 25 mg by mouth at bedtime.     apixaban (ELIQUIS) 5 MG TABS tablet Take 1 tablet (5 mg total) by mouth 2 (two) times daily. 60 tablet 0   Tiotropium Bromide-Olodaterol 2.5-2.5 MCG/ACT AERS  (Patient not taking: Reported on 09/07/2022)     No current facility-administered medications for this visit.    Allergies:   Niacin and Penicillin g    Social History:   reports that he has been smoking cigarettes. He has been smoking an average of 1.5 packs per day. He has never used smokeless tobacco. He reports current alcohol use of about 2.0 - 3.0 standard drinks of alcohol per week. He reports current drug use. Drug: Marijuana.   Family History:  family history is not on file.    ROS:     Review of Systems  Constitutional: Negative.  Negative for fever.  HENT:  Positive for rhinorrhea.   Eyes: Negative.   Respiratory:  Positive for sputum production and shortness of breath.   Cardiovascular:  Positive for PND. Negative for chest pain.  Gastrointestinal: Negative.   Genitourinary: Negative.   Musculoskeletal: Negative.   Skin: Negative.   Neurological: Negative.   Endo/Heme/Allergies: Negative.   Psychiatric/Behavioral: Negative.    All other systems reviewed and are negative.     All other systems are reviewed and negative.    PHYSICAL EXAM: VS:  BP 106/68   Pulse 94   Ht 5\' 10"  (1.778 m)   Wt 157 lb 12.8 oz (71.6 kg)   SpO2 96%   BMI 22.64 kg/m  , BMI Body mass index is 22.64 kg/m. Last weight:  Wt Readings from Last 3 Encounters:  09/14/22 157 lb 12.8 oz (71.6 kg)   09/07/22 155 lb 12.8 oz (70.7 kg)  08/04/22 144 lb 6.4 oz (65.5 kg)     Physical Exam Vitals reviewed.  Constitutional:      Appearance: Normal appearance. He is normal weight.  HENT:     Head: Normocephalic.     Nose: Nose normal.     Mouth/Throat:     Mouth: Mucous membranes are moist.  Eyes:     Pupils: Pupils are equal, round, and  reactive to light.  Cardiovascular:     Rate and Rhythm: Normal rate and regular rhythm.     Pulses: Normal pulses.     Heart sounds: Normal heart sounds.  Pulmonary:     Effort: Pulmonary effort is normal.  Abdominal:     General: Abdomen is flat. Bowel sounds are normal.  Musculoskeletal:        General: Normal range of motion.     Cervical back: Normal range of motion.  Skin:    General: Skin is warm.  Neurological:     General: No focal deficit present.     Mental Status: He is alert.  Psychiatric:        Mood and Affect: Mood normal.       EKG:   Recent Labs: 04/19/2022: TSH 1.545 05/04/2022: Magnesium 2.3 07/12/2022: ALT 16; B Natriuretic Peptide 369.7; BUN 15; Creatinine, Ser 0.94; Hemoglobin 13.7; Platelets 273; Potassium 3.6; Sodium 136    Lipid Panel    Component Value Date/Time   CHOL 151 03/21/2022 0442   TRIG 82 03/21/2022 0442   HDL 39 (L) 03/21/2022 0442   CHOLHDL 3.9 03/21/2022 0442   VLDL 16 03/21/2022 0442   LDLCALC 96 03/21/2022 0442      Other studies Reviewed: Additional studies/ records that were reviewed today include:  Review of the above records demonstrates:       No data to display            ASSESSMENT AND PLAN:    ICD-10-CM   1. Acute combined systolic and diastolic congestive heart failure (HCC)  I50.41     2. Nonrheumatic aortic valve stenosis  I35.0     3. PVD (peripheral vascular disease) (HCC)  I73.9     4. Acute pulmonary edema (HCC)  J81.0     5. New onset atrial fibrillation (HCC)  I48.91        Problem List Items Addressed This Visit       Cardiovascular and  Mediastinum   Acute CHF (congestive heart failure) (HCC) - Primary   New onset atrial fibrillation (HCC)    Continues to have decompensated CHF, consider DCCV.      PVD (peripheral vascular disease) (HCC)   Aortic valve stenosis    Needs TAVR, being done at [facility], [place] docors arranging it.        Respiratory   Acute pulmonary edema (HCC)       Disposition:   Return in about 2 weeks (around 09/28/2022).    Total time spent: 30 minutes  Signed,  Adrian Blackwater, MD  09/14/2022 2:19 PM    [facility]

## 2022-09-14 NOTE — Assessment & Plan Note (Signed)
Needs TAVR, being done at [facility], [place] docors arranging it.

## 2022-09-14 NOTE — Assessment & Plan Note (Signed)
Continues to have decompensated CHF, consider DCCV.

## 2022-09-28 ENCOUNTER — Ambulatory Visit: Payer: Self-pay | Admitting: Cardiovascular Disease

## 2022-09-28 ENCOUNTER — Ambulatory Visit (INDEPENDENT_AMBULATORY_CARE_PROVIDER_SITE_OTHER): Payer: Medicare PPO | Admitting: Cardiovascular Disease

## 2022-09-28 ENCOUNTER — Encounter: Payer: Self-pay | Admitting: Cardiovascular Disease

## 2022-09-28 ENCOUNTER — Ambulatory Visit: Payer: Medicare PPO | Admitting: Cardiovascular Disease

## 2022-09-28 VITALS — BP 114/66 | HR 95 | Ht 70.0 in | Wt 146.4 lb

## 2022-09-28 DIAGNOSIS — I480 Paroxysmal atrial fibrillation: Secondary | ICD-10-CM

## 2022-09-28 DIAGNOSIS — I482 Chronic atrial fibrillation, unspecified: Secondary | ICD-10-CM

## 2022-09-28 DIAGNOSIS — I10 Essential (primary) hypertension: Secondary | ICD-10-CM

## 2022-09-28 DIAGNOSIS — E785 Hyperlipidemia, unspecified: Secondary | ICD-10-CM

## 2022-09-28 MED ORDER — APIXABAN 5 MG PO TABS: 5.0000 mg | ORAL_TABLET | Freq: Two times a day (BID) | ORAL | 15 refills | Status: AC

## 2022-09-28 NOTE — Assessment & Plan Note (Signed)
Patient continues to be in atrial fibrillation. Schedule for cardioversion at [facility].

## 2022-09-28 NOTE — Assessment & Plan Note (Signed)
Well controlled 

## 2022-09-28 NOTE — Assessment & Plan Note (Signed)
03/2022 LDL 96. Continue atorvastatin 80 mg daily.

## 2022-09-28 NOTE — Progress Notes (Signed)
Cardiology Office Note   Date:  09/28/2022   ID:  Jason Beck, DOB 07/12/1955, MRN [id number]  PCP:  [facility]  Cardiologist:  Adrian Blackwater, MD      History of Present Illness: Jason Beck is a 67 y.o. male who presents for  Chief Complaint  Patient presents with   Follow-up    2 week follow up    Patient in office for 2 week follow up. Complains of feeling fatigued. Had an episode of chest pain last Tuesday.     Past Medical History:  Diagnosis Date   Asthma    Atrial fibrillation (HCC)    Bicuspid aortic valve    CHF (congestive heart failure) (HCC)    COPD (chronic obstructive pulmonary disease) (HCC)    Depression    Diabetes mellitus without complication (HCC)    Hypertension      Past Surgical History:  Procedure Laterality Date   CORONARY PRESSURE/FFR STUDY N/A 04/27/2022   Procedure: INTRAVASCULAR PRESSURE WIRE/FFR STUDY;  Surgeon: Iran Ouch, MD;  Location: [facility] INVASIVE CV [facility];  Service: Cardiovascular;  Laterality: N/A;   LEFT HEART CATH AND CORONARY ANGIOGRAPHY N/A 04/27/2022   Procedure: LEFT HEART CATH AND CORONARY ANGIOGRAPHY with  possible coronary intervention;  Surgeon: Laurier [name], MD;  Location: [facility] CV [facility];  Service: Cardiovascular;  Laterality: N/A;   toe amputations       Current Outpatient Medications  Medication Sig Dispense Refill   albuterol (PROVENTIL) (2.5 MG/3ML) 0.083% nebulizer solution      albuterol (VENTOLIN HFA) 108 (90 Base) MCG/ACT inhaler INHALE 2 PUFFS BY ORAL INHALATION EVERY 4 TO 6 HOURS AS NEEDED FOR CHRONIC OBSTRUCTIVE LUNG DISEASE FOR BREATHING. BE SURE TO WASH MOUTHPIECE WITH WARM WATER ONCE A WEEK     amiodarone (PACERONE) 200 MG tablet Take 1 tablet (200 mg total) by mouth daily. 90 tablet 1   atorvastatin (LIPITOR) 80 MG tablet Take 1 tablet (80 mg total) by mouth daily. 90 tablet 3   ciclesonide (ALVESCO) 160 MCG/ACT inhaler Inhale 2 puffs into the lungs 2 (two) times  daily.     clopidogrel (PLAVIX) 75 MG tablet Take 1 tablet (75 mg total) by mouth daily. 30 tablet 1   docusate sodium (COLACE) 50 MG capsule Take 50 mg by mouth daily as needed for mild constipation.     doxycycline (ADOXA) 100 MG tablet Take 100 mg by mouth 2 (two) times daily.     empagliflozin (JARDIANCE) 10 MG TABS tablet Take 10 mg by mouth daily.     furosemide (LASIX) 40 MG tablet Take 1 tablet (40 mg total) by mouth daily. 30 tablet 11   gabapentin (NEURONTIN) 400 MG capsule Take 800 mg by mouth 3 (three) times daily.     losartan (COZAAR) 25 MG tablet Take 1 tablet (25 mg total) by mouth daily. (Patient taking differently: Take 12.5 mg by mouth daily.) 30 tablet 11   metFORMIN (GLUCOPHAGE) 500 MG tablet Take 500 mg by mouth 2 (two) times daily with a meal.     methadone (DOLOPHINE) 10 MG tablet Take 5 tablets by mouth 2 (two) times daily.     metoprolol succinate (TOPROL XL) 50 MG 24 hr tablet Take 1 tablet (50 mg total) by mouth daily. Take with or immediately following a meal. 30 tablet 11   midodrine (PROAMATINE) 10 MG tablet Take 1 tablet (10 mg total) by mouth 3 (three) times daily with meals. (Patient taking  differently: Take 5 mg by mouth as needed.) 90 tablet 1   nitroGLYCERIN (NITROSTAT) 0.4 MG SL tablet Place 1 tablet (0.4 mg total) under the tongue every 5 (five) minutes x 3 doses as needed for chest pain. 30 tablet 0   oxyCODONE (OXY IR/ROXICODONE) 5 MG immediate release tablet Take 10 mg by mouth every 4 (four) hours as needed for severe pain.     oxycodone (OXY-IR) 5 MG capsule Take 5 mg by mouth every 4 (four) hours as needed.     pantoprazole (PROTONIX) 40 MG tablet TAKE 1 TABLET BY MOUTH EVERY DAY 30 tablet 1   potassium chloride SA (KLOR-CON M) 20 MEQ tablet Take 1 tablet (20 mEq total) by mouth daily. 30 tablet 1   ranolazine (RANEXA) 500 MG 12 hr tablet Take 1 tablet (500 mg total) by mouth 2 (two) times daily. 60 tablet 11   thiamine (VITAMIN B-1) 100 MG tablet Take 1  tablet (100 mg total) by mouth daily. 30 tablet 3   Tiotropium Bromide-Olodaterol 2.5-2.5 MCG/ACT AERS      traZODone (DESYREL) 50 MG tablet Take 25 mg by mouth at bedtime.     apixaban (ELIQUIS) 5 MG TABS tablet Take 1 tablet (5 mg total) by mouth 2 (two) times daily. 60 tablet 15   tiotropium (SPIRIVA) 18 MCG inhalation capsule Place 1 capsule (18 mcg total) into inhaler and inhale daily. 30 capsule 1   No current facility-administered medications for this visit.    Allergies:   Niacin and Penicillin g    Social History:   reports that he has been smoking cigarettes. He has been smoking an average of 1.5 packs per day. He has never used smokeless tobacco. He reports current alcohol use of about 2.0 - 3.0 standard drinks of alcohol per week. He reports current drug use. Drug: Marijuana.   Family History:  family history is not on file.    ROS:     Review of Systems  Constitutional:  Positive for malaise/fatigue.  HENT: Negative.    Eyes: Negative.   Cardiovascular: Negative.   Gastrointestinal: Negative.   Genitourinary: Negative.   Musculoskeletal: Negative.   Skin: Negative.   Neurological: Negative.   Endo/Heme/Allergies: Negative.   Psychiatric/Behavioral: Negative.    All other systems reviewed and are negative.    All other systems are reviewed and negative.    PHYSICAL EXAM: VS:  BP 114/66   Pulse 95   Ht 5\' 10"  (1.778 m)   Wt 146 lb 6.4 oz (66.4 kg)   SpO2 97%   BMI 21.01 kg/m  , BMI Body mass index is 21.01 kg/m. Last weight:  Wt Readings from Last 3 Encounters:  09/28/22 146 lb 6.4 oz (66.4 kg)  09/14/22 157 lb 12.8 oz (71.6 kg)  09/07/22 155 lb 12.8 oz (70.7 kg)     Physical Exam Vitals reviewed.  Constitutional:      Appearance: Normal appearance. He is normal weight.  HENT:     Head: Normocephalic.     Nose: Nose normal.     Mouth/Throat:     Mouth: Mucous membranes are moist.  Eyes:     Pupils: Pupils are equal, round, and reactive to  light.  Cardiovascular:     Rate and Rhythm: Normal rate and regular rhythm.     Pulses: Normal pulses.     Heart sounds: Normal heart sounds.  Pulmonary:     Effort: Pulmonary effort is normal.  Abdominal:     General:  Abdomen is flat. Bowel sounds are normal.  Musculoskeletal:        General: Normal range of motion.     Cervical back: Normal range of motion.  Skin:    General: Skin is warm.  Neurological:     General: No focal deficit present.     Mental Status: He is alert.  Psychiatric:        Mood and Affect: Mood normal.      EKG: none today  Recent Labs: 04/19/2022: TSH 1.545 05/04/2022: Magnesium 2.3 07/12/2022: ALT 16; B Natriuretic Peptide 369.7; BUN 15; Creatinine, Ser 0.94; Hemoglobin 13.7; Platelets 273; Potassium 3.6; Sodium 136    Lipid Panel    Component Value Date/Time   CHOL 151 03/21/2022 0442   TRIG 82 03/21/2022 0442   HDL 39 (L) 03/21/2022 0442   CHOLHDL 3.9 03/21/2022 0442   VLDL 16 03/21/2022 0442   LDLCALC 96 03/21/2022 0442      ASSESSMENT AND PLAN:    ICD-10-CM   1. Chronic atrial fibrillation (HCC)  I48.20     2. Essential hypertension  I10     3. Dyslipidemia  E78.5        Problem List Items Addressed This Visit       Cardiovascular and Mediastinum   Essential hypertension    Well controlled.      Relevant Medications   apixaban (ELIQUIS) 5 MG TABS tablet   Chronic atrial fibrillation (HCC) - Primary    Patient continues to be in atrial fibrillation. Schedule for cardioversion at [facility].      Relevant Medications   apixaban (ELIQUIS) 5 MG TABS tablet     Other   Dyslipidemia    03/2022 LDL 96. Continue atorvastatin 80 mg daily.         Disposition:   Return in about 1 week (around 10/05/2022) for after cardioversion.    Total time spent: 30 minutes  Signed,  Adrian Blackwater, MD  09/28/2022 2:58 PM    [facility]

## 2022-10-02 ENCOUNTER — Encounter: Payer: Self-pay | Admitting: Anesthesiology

## 2022-10-04 MED ORDER — SODIUM CHLORIDE 0.9 % IV SOLN: INTRAVENOUS | Status: DC

## 2022-10-05 ENCOUNTER — Ambulatory Visit: Admission: RE | Admit: 2022-10-05 | Payer: Medicare PPO | Source: Ambulatory Visit | Admitting: Cardiovascular Disease

## 2022-10-05 ENCOUNTER — Encounter: Admission: RE | Payer: Self-pay | Source: Ambulatory Visit

## 2022-10-05 DIAGNOSIS — I480 Paroxysmal atrial fibrillation: Secondary | ICD-10-CM

## 2022-10-05 SURGERY — CARDIOVERSION
Anesthesia: General

## 2022-10-12 ENCOUNTER — Ambulatory Visit: Payer: Medicare PPO | Admitting: Cardiovascular Disease

## 2023-03-12 ENCOUNTER — Ambulatory Visit (INDEPENDENT_AMBULATORY_CARE_PROVIDER_SITE_OTHER): Payer: No Typology Code available for payment source | Admitting: Podiatry

## 2023-03-12 ENCOUNTER — Encounter: Payer: Self-pay | Admitting: Podiatry

## 2023-03-12 DIAGNOSIS — M79675 Pain in left toe(s): Secondary | ICD-10-CM

## 2023-03-12 DIAGNOSIS — E119 Type 2 diabetes mellitus without complications: Secondary | ICD-10-CM | POA: Diagnosis not present

## 2023-03-12 DIAGNOSIS — M79674 Pain in right toe(s): Secondary | ICD-10-CM

## 2023-03-12 DIAGNOSIS — B351 Tinea unguium: Secondary | ICD-10-CM | POA: Diagnosis not present

## 2023-03-12 NOTE — Progress Notes (Signed)
   Chief Complaint  Patient presents with   Callouses    "It's my right foot.  I have a callus that has built up."   Nail Problem    "Clip my toenails."    SUBJECTIVE Patient with a history of diabetes mellitus presents to office today complaining of elongated, thickened nails that cause pain while ambulating in shoes.  Patient is unable to trim their own nails.  Patient also has a painful callus to the lateral aspect of the right foot along the incision site from prior amputation surgery patient is here for further evaluation and treatment.  Past Medical History:  Diagnosis Date   Asthma    Atrial fibrillation (HCC)    Bicuspid aortic valve    CHF (congestive heart failure) (HCC)    COPD (chronic obstructive pulmonary disease) (HCC)    Depression    Diabetes mellitus without complication (HCC)    Hypertension     Allergies  Allergen Reactions   Niacin Other (See Comments)   Penicillin G      OBJECTIVE General Patient is awake, alert, and oriented x 3 and in no acute distress. Derm Skin is dry and supple bilateral. Negative open lesions or macerations. Remaining integument unremarkable. Nails are tender, long, thickened and dystrophic with subungual debris, consistent with onychomycosis, 1-5 bilateral. No signs of infection noted. Vasc history of peripheral vascular disease.  No acute ischemia or change Neuro light touch and protective threshold sensation diminished bilaterally.  Musculoskeletal Exam No symptomatic pedal deformities noted bilateral. Muscular strength within normal limits.  ASSESSMENT 1. Diabetes Mellitus w/ peripheral neuropathy 2.  Pain due to onychomycosis of toenails bilateral 3.  History of peripheral vascular disease 4.  History of amputation RT second and fifth toe  PLAN OF CARE -Patient evaluated today.  Comprehensive diabetic foot exam performed today -Instructed to maintain good pedal hygiene and foot care. Stressed importance of controlling  blood sugar.  -Mechanical debridement of nails 1-5 bilaterally performed using a nail nipper. Filed with dremel without incident.  -Excisional debridement of the hyperkeratotic callus tissue along the incision site to the lateral aspect of the right foot was performed today using a 312 scalpel.  Patient did feel some relief with this.  Recommend skin softener with salicylic acid to soften the hard callus to the area daily -Return to clinic 3 months routine footcare    Felecia Shelling, DPM [facility]  Dr. Felecia Shelling, DPM    [address] [place], [place] [address]                Office [phone]  Fax [phone]

## 2023-04-29 ENCOUNTER — Encounter: Payer: No Typology Code available for payment source | Attending: Nurse Practitioner | Admitting: *Deleted

## 2023-04-29 ENCOUNTER — Encounter: Payer: Self-pay | Admitting: *Deleted

## 2023-04-29 DIAGNOSIS — Z952 Presence of prosthetic heart valve: Secondary | ICD-10-CM | POA: Insufficient documentation

## 2023-04-29 DIAGNOSIS — R0600 Dyspnea, unspecified: Secondary | ICD-10-CM | POA: Insufficient documentation

## 2023-04-29 DIAGNOSIS — R0602 Shortness of breath: Secondary | ICD-10-CM | POA: Insufficient documentation

## 2023-04-29 NOTE — Progress Notes (Signed)
 Virtual orientation call completed today. he has an appointment on Date: 05/06/2023  for EP eval and gym Orientation.  Documentation of diagnosis can be found in [facility] Date: 03/23/2023 .  Jason Beck is a current tobacco user. Intervention for tobacco cessation was provided at the initial medical review. He was asked about readiness to quit and reported he his working  on cessation with meds and his physicians help. . Patient was advised and educated about tobacco cessation using combination therapy, tobacco cessation classes, quit line, and quit smoking apps. Patient demonstrated understanding of this material. Staff will continue to provide encouragement and follow up with the patient throughout the program.

## 2023-05-06 VITALS — Ht 69.8 in | Wt 161.7 lb

## 2023-05-06 DIAGNOSIS — R0602 Shortness of breath: Secondary | ICD-10-CM | POA: Diagnosis not present

## 2023-05-06 DIAGNOSIS — Z952 Presence of prosthetic heart valve: Secondary | ICD-10-CM

## 2023-05-06 DIAGNOSIS — R0600 Dyspnea, unspecified: Secondary | ICD-10-CM | POA: Diagnosis present

## 2023-05-06 NOTE — Progress Notes (Signed)
Cardiac Individual Treatment Plan  Patient Details  Name: Jason Beck MRN: [id number] Date of Birth: 09/11/1955 Referring Provider:   Flowsheet Row Cardiac Rehab from 05/06/2023 in [facility] Cardiac and Pulmonary Rehab  Referring Provider Concha Pyo, FNP       Initial Encounter Date:  Flowsheet Row Cardiac Rehab from 05/06/2023 in [facility] Cardiac and Pulmonary Rehab  Date 05/06/23       Visit Diagnosis: Dyspnea, unspecified type  Shortness of breath  S/P TAVR (transcatheter aortic valve replacement)  Patient's Home Medications on Admission:  Current Outpatient Medications:    albuterol (PROVENTIL) (2.5 MG/3ML) 0.083% nebulizer solution, , Disp: , Rfl:    albuterol (VENTOLIN HFA) 108 (90 Base) MCG/ACT inhaler, INHALE 2 PUFFS BY ORAL INHALATION EVERY 4 TO 6 HOURS AS NEEDED FOR CHRONIC OBSTRUCTIVE LUNG DISEASE FOR BREATHING. BE SURE TO WASH MOUTHPIECE WITH WARM WATER ONCE A WEEK, Disp: , Rfl:    amiodarone (PACERONE) 200 MG tablet, Take 1 tablet (200 mg total) by mouth daily., Disp: 90 tablet, Rfl: 1   apixaban (ELIQUIS) 5 MG TABS tablet, Take 1 tablet (5 mg total) by mouth 2 (two) times daily., Disp: 60 tablet, Rfl: 15   atorvastatin (LIPITOR) 80 MG tablet, Take 1 tablet (80 mg total) by mouth daily., Disp: 90 tablet, Rfl: 3   buPROPion (WELLBUTRIN SR) 150 MG 12 hr tablet, Take 150 mg by mouth daily., Disp: , Rfl:    ciclesonide (ALVESCO) 160 MCG/ACT inhaler, Inhale 2 puffs into the lungs 2 (two) times daily., Disp: , Rfl:    clopidogrel (PLAVIX) 75 MG tablet, Take 1 tablet (75 mg total) by mouth daily. (Patient not taking: Reported on 04/29/2023), Disp: 30 tablet, Rfl: 1   docusate sodium (COLACE) 50 MG capsule, Take 50 mg by mouth daily as needed for mild constipation., Disp: , Rfl:    doxycycline (ADOXA) 100 MG tablet, Take 100 mg by mouth 2 (two) times daily. (Patient not taking: Reported on 04/29/2023), Disp: , Rfl:    empagliflozin (JARDIANCE) 10 MG TABS tablet, Take 10 mg by  mouth daily., Disp: , Rfl:    ezetimibe (ZETIA) 10 MG tablet, Take 1 tablet by mouth daily., Disp: , Rfl:    ferrous sulfate 325 (65 FE) MG tablet, Take 325 mg by mouth every other day., Disp: , Rfl:    furosemide (LASIX) 40 MG tablet, Take 1 tablet (40 mg total) by mouth daily., Disp: 30 tablet, Rfl: 11   gabapentin (NEURONTIN) 400 MG capsule, Take 800 mg by mouth 3 (three) times daily., Disp: , Rfl:    losartan (COZAAR) 25 MG tablet, Take 1 tablet (25 mg total) by mouth daily. (Patient taking differently: Take 12.5 mg by mouth daily.), Disp: 30 tablet, Rfl: 11   metFORMIN (GLUCOPHAGE) 500 MG tablet, Take 500 mg by mouth 2 (two) times daily with a meal., Disp: , Rfl:    methadone (DOLOPHINE) 10 MG tablet, Take 5 tablets by mouth 2 (two) times daily., Disp: , Rfl:    metoprolol succinate (TOPROL XL) 50 MG 24 hr tablet, Take 1 tablet (50 mg total) by mouth daily. Take with or immediately following a meal., Disp: 30 tablet, Rfl: 11   midodrine (PROAMATINE) 10 MG tablet, Take 1 tablet (10 mg total) by mouth 3 (three) times daily with meals. (Patient not taking: Reported on 04/29/2023), Disp: 90 tablet, Rfl: 1   naloxone (NARCAN) nasal spray 4 mg/0.1 mL, Place 0.4 mg into the nose once., Disp: , Rfl:    nitroGLYCERIN (NITROSTAT)  0.4 MG SL tablet, Place 1 tablet (0.4 mg total) under the tongue every 5 (five) minutes x 3 doses as needed for chest pain., Disp: 30 tablet, Rfl: 0   omeprazole (PRILOSEC) 20 MG capsule, Take 20 mg by mouth daily., Disp: , Rfl:    oxyCODONE (OXY IR/ROXICODONE) 5 MG immediate release tablet, Take 10 mg by mouth every 4 (four) hours as needed for severe pain. (Patient not taking: Reported on 04/29/2023), Disp: , Rfl:    oxycodone (OXY-IR) 5 MG capsule, Take 5 mg by mouth every 4 (four) hours as needed., Disp: , Rfl:    pantoprazole (PROTONIX) 40 MG tablet, TAKE 1 TABLET BY MOUTH EVERY DAY (Patient not taking: Reported on 04/29/2023), Disp: 30 tablet, Rfl: 1   potassium chloride SA  (KLOR-CON M) 20 MEQ tablet, Take 1 tablet (20 mEq total) by mouth daily., Disp: 30 tablet, Rfl: 1   ranolazine (RANEXA) 500 MG 12 hr tablet, Take 1 tablet (500 mg total) by mouth 2 (two) times daily. (Patient not taking: Reported on 04/29/2023), Disp: 60 tablet, Rfl: 11   thiamine (VITAMIN B-1) 100 MG tablet, Take 1 tablet (100 mg total) by mouth daily., Disp: 30 tablet, Rfl: 3   ticagrelor (BRILINTA) 90 MG TABS tablet, Take 1 tablet by mouth 2 (two) times daily., Disp: , Rfl:    tiotropium (SPIRIVA) 18 MCG inhalation capsule, Place 1 capsule (18 mcg total) into inhaler and inhale daily., Disp: 30 capsule, Rfl: 1   Tiotropium Bromide-Olodaterol 2.5-2.5 MCG/ACT AERS, , Disp: , Rfl:    traZODone (DESYREL) 50 MG tablet, Take 25 mg by mouth at bedtime., Disp: , Rfl:   Past Medical History: Past Medical History:  Diagnosis Date   Asthma    Atrial fibrillation (HCC)    Bicuspid aortic valve    CHF (congestive heart failure) (HCC)    COPD (chronic obstructive pulmonary disease) (HCC)    Depression    Diabetes mellitus without complication (HCC)    Hypertension     Tobacco Use: Social History   Tobacco Use  Smoking Status Every Day   Current packs/day: 0.50   Types: Cigarettes  Smokeless Tobacco Never  Tobacco Comments   Ready to quit: working on quit  taking meds   Counseling given: yes    Labs: Review Flowsheet  More data may exist      Latest Ref Rng & Units 03/20/2022 03/21/2022 04/19/2022 05/02/2022 06/16/2022  Labs for ITP Cardiac and Pulmonary Rehab  Cholestrol 0 - 200 mg/dL 347  425  - - -  LDL (calc) 0 - 99 mg/dL 94  96  - - -  HDL-C >95 mg/dL 36  39  - - -  Trlycerides <150 mg/dL 638  82  - - -  Hemoglobin A1c 4.8 - 5.6 % - 5.8  6.4  - -  Bicarbonate 20.0 - 28.0 mmol/L - - - 19.9  26.4   Acid-base deficit 0.0 - 2.0 mmol/L - - - 3.3  -  O2 Saturation % - - - 78.8  95.1      Exercise Target Goals: Exercise Program Goal: Individual exercise prescription set using results  from initial 6 min walk test and THRR while considering  patient's activity barriers and safety.   Exercise Prescription Goal: Initial exercise prescription builds to 30-45 minutes a day of aerobic activity, 2-3 days per week.  Home exercise guidelines will be given to patient during program as part of exercise prescription that the participant will acknowledge.  Education: Aerobic Exercise: - Group verbal and visual presentation on the components of exercise prescription. Introduces F.I.T.T principle from ACSM for exercise prescriptions.  Reviews F.I.T.T. principles of aerobic exercise including progression. Written material given at graduation.   Education: Resistance Exercise: - Group verbal and visual presentation on the components of exercise prescription. Introduces F.I.T.T principle from ACSM for exercise prescriptions  Reviews F.I.T.T. principles of resistance exercise including progression. Written material given at graduation.    Education: Exercise & Equipment Safety: - Individual verbal instruction and demonstration of equipment use and safety with use of the equipment. Flowsheet Row Cardiac Rehab from 05/06/2023 in [facility] Cardiac and Pulmonary Rehab  Date 05/06/23  Educator MB  Instruction Review Code 1- Verbalizes Understanding       Education: Exercise Physiology & General Exercise Guidelines: - Group verbal and written instruction with models to review the exercise physiology of the cardiovascular system and associated critical values. Provides general exercise guidelines with specific guidelines to those with heart or lung disease.    Education: Flexibility, Balance, Mind/Body Relaxation: - Group verbal and visual presentation with interactive activity on the components of exercise prescription. Introduces F.I.T.T principle from ACSM for exercise prescriptions. Reviews F.I.T.T. principles of flexibility and balance exercise training including progression. Also discusses  the mind body connection.  Reviews various relaxation techniques to help reduce and manage stress (i.e. Deep breathing, progressive muscle relaxation, and visualization). Balance handout provided to take home. Written material given at graduation.   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 HR 82 bpm     Max Ex. BP 140/72     2 Minute Post BP 134/72              Oxygen Initial Assessment:   Oxygen Re-Evaluation:   Oxygen Discharge (Final Oxygen Re-Evaluation):   Initial Exercise Prescription:  Initial Exercise Prescription - 05/06/23 1600       Date of Initial Exercise RX and Referring Provider   Date 05/06/23    Referring Provider Concha Pyo, FNP      Oxygen   Maintain Oxygen Saturation 88% or higher      Recumbant Bike  Level 1    RPM 50    Watts 15    Minutes 15    METs 1.91      NuStep   Level 1    SPM 80    Minutes 15    METs 1.91      Biostep-RELP   Level 1    SPM 50    Minutes 15    METs 1.91      Track   Laps 17    Minutes 15    METs 1.92       Prescription Details   Frequency (times per week) 2    Duration Progress to 30 minutes of continuous aerobic without signs/symptoms of physical distress      Intensity   THRR 40-80% of Max Heartrate 92-132    Ratings of Perceived Exertion 11-13    Perceived Dyspnea 0-4      Progression   Progression Continue to progress workloads to maintain intensity without signs/symptoms of physical distress.      Resistance Training   Training Prescription Yes    Weight 7 lb    Reps 10-15             Perform Capillary Blood Glucose checks as needed.  Exercise Prescription Changes:   Exercise Prescription Changes     Row Name 05/06/23 1600             Response to Exercise   Blood Pressure (Admit) 130/80       Blood Pressure (Exercise) 140/72       Blood Pressure (Exit) 134/72       Heart Rate (Admit) 53 bpm       Heart Rate (Exercise) 82 bpm       Heart Rate (Exit) 44 bpm       Oxygen Saturation (Admit) 97 %       Oxygen Saturation (Exercise) 95 %       Oxygen Saturation (Exit) 98 %       Rating of Perceived Exertion (Exercise) 15       Perceived Dyspnea (Exercise) 0       Symptoms L leg and hip 8/10, R foot 8/10       Comments results       Duration Progress to 30 minutes of  aerobic without signs/symptoms of physical distress       Intensity THRR New         Progression   Progression Continue to progress workloads to maintain intensity without signs/symptoms of physical distress.       Average METs 1.91                Exercise Comments:   Exercise Goals and Review:   Exercise Goals     Row Name 05/06/23 1638             Exercise Goals   Increase Physical Activity Yes       Intervention Develop an individualized exercise prescription for aerobic and resistive training based on initial evaluation findings, risk stratification, comorbidities and participant's personal goals.;Provide advice, education, support and counseling about physical  activity/exercise needs.       Expected Outcomes Short Term: Attend rehab on a regular basis to increase amount of physical activity.;Long Term: Add in home exercise to make exercise part of routine and to increase amount of physical activity.;Long Term: Exercising regularly at least 3-5 days a week.       Increase Strength and Stamina Yes  Intervention Provide advice, education, support and counseling about physical activity/exercise needs.;Develop an individualized exercise prescription for aerobic and resistive training based on initial evaluation findings, risk stratification, comorbidities and participant's personal goals.       Expected Outcomes Short Term: Increase workloads from initial exercise prescription for resistance, speed, and METs.;Short Term: Perform resistance training exercises routinely during rehab and add in resistance training at home;Long Term: Improve cardiorespiratory fitness, muscular endurance and strength as measured by increased METs and functional capacity ( )       Able to understand and use rate of perceived exertion (RPE) scale Yes       Intervention Provide education and explanation on how to use RPE scale       Expected Outcomes Short Term: Able to use RPE daily in rehab to express subjective intensity level;Long Term:  Able to use RPE to guide intensity level when exercising independently       Able to understand and use Dyspnea scale Yes       Intervention Provide education and explanation on how to use Dyspnea scale       Expected Outcomes Short Term: Able to use Dyspnea scale daily in rehab to express subjective sense of shortness of breath during exertion;Long Term: Able to use Dyspnea scale to guide intensity level when exercising independently       Knowledge and understanding of Target Heart Rate Range (THRR) Yes       Intervention Provide education and explanation of THRR including how the numbers were predicted and where they are located for  reference       Expected Outcomes Short Term: Able to state/look up THRR;Short Term: Able to use daily as guideline for intensity in rehab;Long Term: Able to use THRR to govern intensity when exercising independently       Able to check pulse independently Yes       Intervention Provide education and demonstration on how to check pulse in carotid and radial arteries.;Review the importance of being able to check your own pulse for safety during independent exercise       Expected Outcomes Short Term: Able to explain why pulse checking is important during independent exercise;Long Term: Able to check pulse independently and accurately       Understanding of Exercise Prescription Yes       Intervention Provide education, explanation, and written materials on patient's individual exercise prescription       Expected Outcomes Short Term: Able to explain program exercise prescription;Long Term: Able to explain home exercise prescription to exercise independently                Exercise Goals Re-Evaluation :   Discharge Exercise Prescription (Final Exercise Prescription Changes):  Exercise Prescription Changes - 05/06/23 1600       Response to Exercise   Blood Pressure (Admit) 130/80    Blood Pressure (Exercise) 140/72    Blood Pressure (Exit) 134/72    Heart Rate (Admit) 53 bpm    Heart Rate (Exercise) 82 bpm    Heart Rate (Exit) 44 bpm    Oxygen Saturation (Admit) 97 %    Oxygen Saturation (Exercise) 95 %    Oxygen Saturation (Exit) 98 %    Rating of Perceived Exertion (Exercise) 15    Perceived Dyspnea (Exercise) 0    Symptoms L leg and hip 8/10, R foot 8/10    Comments results    Duration Progress to 30 minutes of  aerobic without signs/symptoms of physical  distress    Intensity THRR New      Progression   Progression Continue to progress workloads to maintain intensity without signs/symptoms of physical distress.    Average METs 1.91             Nutrition:  Target  Goals: Understanding of nutrition guidelines, daily intake of sodium 1500mg , cholesterol 200mg , calories 30% from fat and 7% or less from saturated fats, daily to have 5 or more servings of fruits and vegetables.  Education: All About Nutrition: -Group instruction provided by verbal, written material, interactive activities, discussions, models, and posters to present general guidelines for heart healthy nutrition including fat, fiber, MyPlate, the role of sodium in heart healthy nutrition, utilization of the nutrition label, and utilization of this knowledge for meal planning. Follow up email sent as well. Written material given at graduation.   Biometrics:  Pre Biometrics - 05/06/23 1639       Pre Biometrics   Height 5' 9.8" (1.773 m)    Weight 161 lb 11.2 oz (73.3 kg)    Waist Circumference 42 inches    Hip Circumference 39 inches    Waist to Hip Ratio 1.08 %    BMI (Calculated) 23.33    Single Leg Stand 12.8 seconds              Nutrition Therapy Plan and Nutrition Goals:  Nutrition Therapy & Goals - 05/06/23 1640       Nutrition Therapy   RD appointment deferred Yes      Personal Nutrition Goals   Nutrition Goal RD appointmetnt deferred      Intervention Plan   Intervention Prescribe, educate and counsel regarding individualized specific dietary modifications aiming towards targeted core components such as weight, hypertension, lipid management, diabetes, heart failure and other comorbidities.;Nutrition handout(s) given to patient.    Expected Outcomes Short Term Goal: Understand basic principles of dietary content, such as calories, fat, sodium, cholesterol and nutrients.             Nutrition Assessments:  MEDIFICTS Score Key: >=70 Need to make dietary changes  40-70 Heart Healthy Diet <= 40 Therapeutic Level Cholesterol Diet   Picture Your Plate Scores: <40 Unhealthy dietary pattern with much room for improvement. 41-50 Dietary pattern unlikely to  meet recommendations for good health and room for improvement. 51-60 More healthful dietary pattern, with some room for improvement.  >60 Healthy dietary pattern, although there may be some specific behaviors that could be improved.    Nutrition Goals Re-Evaluation:   Nutrition Goals Discharge (Final Nutrition Goals Re-Evaluation):   Psychosocial: Target Goals: Acknowledge presence or absence of significant depression and/or stress, maximize coping skills, provide positive support system. Participant is able to verbalize types and ability to use techniques and skills needed for reducing stress and depression.   Education: Stress, Anxiety, and Depression - Group verbal and visual presentation to define topics covered.  Reviews how body is impacted by stress, anxiety, and depression.  Also discusses healthy ways to reduce stress and to treat/manage anxiety and depression.  Written material given at graduation.   Education: Sleep Hygiene -Provides group verbal and written instruction about how sleep can affect your health.  Define sleep hygiene, discuss sleep cycles and impact of sleep habits. Review good sleep hygiene tips.    Initial Review & Psychosocial Screening:  Initial Psych Review & Screening - 04/29/23 1229       Family Dynamics   Good Support System? No    Comments no  support   all friends and family have died in past few years             Quality of Life Scores:   Scores of 19 and below usually indicate a poorer quality of life in these areas.  A difference of  2-3 points is a clinically meaningful difference.  A difference of 2-3 points in the total score of the Quality of Life Index has been associated with significant improvement in overall quality of life, self-image, physical symptoms, and general health in studies assessing change in quality of life.  PHQ-9: Review Flowsheet       05/06/2023  Depression screen PHQ 2/9  Decreased Interest 2  Down,  Depressed, Hopeless 1  PHQ - 2 Score 3  Altered sleeping 3  Tired, decreased energy 3  Change in appetite 1  Feeling bad or failure about yourself  0  Trouble concentrating 0  Moving slowly or fidgety/restless 0  Suicidal thoughts 1  PHQ-9 Score 11  Difficult doing work/chores Somewhat difficult   Interpretation of Total Score  Total Score Depression Severity:  1-4 = Minimal depression, 5-9 = Mild depression, 10-14 = Moderate depression, 15-19 = Moderately severe depression, 20-27 = Severe depression   Psychosocial Evaluation and Intervention:  Psychosocial Evaluation - 04/29/23 1230       Psychosocial Evaluation & Interventions   Interventions Encouraged to exercise with the program and follow exercise prescription    Comments Chance has no barriers to attending the program.  He hopes to improve his ability to get outside and do his outside chores without stoppping and resting as much.  He does have PAD and this limits him at times as well as his dyspnea symptoms.  He does not have any support system.  He is ready to get started.    Continue Psychosocial Services  Follow up required by staff             Psychosocial Re-Evaluation:   Psychosocial Discharge (Final Psychosocial Re-Evaluation):   Vocational Rehabilitation: Provide vocational rehab assistance to qualifying candidates.   Vocational Rehab Evaluation & Intervention:   Education: Education Goals: Education classes will be provided on a variety of topics geared toward better understanding of heart health and risk factor modification. Participant will state understanding/return demonstration of topics presented as noted by education test scores.  Learning Barriers/Preferences:   General Cardiac Education Topics:  AED/CPR: - Group verbal and written instruction with the use of models to demonstrate the basic use of the AED with the basic ABC's of resuscitation.   Anatomy and Cardiac Procedures: - Group  verbal and visual presentation and models provide information about basic cardiac anatomy and function. Reviews the testing methods done to diagnose heart disease and the outcomes of the test results. Describes the treatment choices: Medical Management, Angioplasty, or Coronary Bypass Surgery for treating various heart conditions including Myocardial Infarction, Angina, Valve Disease, and Cardiac Arrhythmias.  Written material given at graduation.   Medication Safety: - Group verbal and visual instruction to review commonly prescribed medications for heart and lung disease. Reviews the medication, class of the drug, and side effects. Includes the steps to properly store meds and maintain the prescription regimen.  Written material given at graduation.   Intimacy: - Group verbal instruction through game format to discuss how heart and lung disease can affect sexual intimacy. Written material given at graduation..   Know Your Numbers and Heart Failure: - Group verbal and visual instruction to discuss disease risk  factors for cardiac and pulmonary disease and treatment options.  Reviews associated critical values for Overweight/Obesity, Hypertension, Cholesterol, and Diabetes.  Discusses basics of heart failure: signs/symptoms and treatments.  Introduces Heart Failure Zone chart for action plan for heart failure.  Written material given at graduation.   Infection Prevention: - Provides verbal and written material to individual with discussion of infection control including proper hand washing and proper equipment cleaning during exercise session. Flowsheet Row Cardiac Rehab from 05/06/2023 in [facility] Cardiac and Pulmonary Rehab  Date 05/06/23  Educator MB  Instruction Review Code 1- Verbalizes Understanding       Falls Prevention: - Provides verbal and written material to individual with discussion of falls prevention and safety. Flowsheet Row Cardiac Rehab from 05/06/2023 in [facility] Cardiac and  Pulmonary Rehab  Date 05/06/23  Educator MB  Instruction Review Code 2- Demonstrated Understanding       Other: -Provides group and verbal instruction on various topics (see comments)   Knowledge Questionnaire Score:   Core Components/Risk Factors/Patient Goals at Admission:  Personal Goals and Risk Factors at Admission - 05/06/23 1640       Core Components/Risk Factors/Patient Goals on Admission    Weight Management Yes    Intervention Weight Management: Develop a combined nutrition and exercise program designed to reach desired caloric intake, while maintaining appropriate intake of nutrient and fiber, sodium and fats, and appropriate energy expenditure required for the weight goal.;Weight Management: Provide education and appropriate resources to help participant work on and attain dietary goals.;Weight Management/Obesity: Establish reasonable short term and long term weight goals.    Admit Weight 161 lb 11.2 oz (73.3 kg)    Goal Weight: Short Term 155 lb (70.3 kg)    Goal Weight: Long Term 165 lb (74.8 kg)    Expected Outcomes Short Term: Continue to assess and modify interventions until short term weight is achieved;Long Term: Adherence to nutrition and physical activity/exercise program aimed toward attainment of established weight goal;Weight Maintenance: Understanding of the daily nutrition guidelines, which includes 25-35% calories from fat, 7% or less cal from saturated fats, less than 200mg  cholesterol, less than 1.5gm of sodium, & 5 or more servings of fruits and vegetables daily;Understanding recommendations for meals to include 15-35% energy as protein, 25-35% energy from fat, 35-60% energy from carbohydrates, less than 200mg  of dietary cholesterol, 20-35 gm of total fiber daily;Understanding of distribution of calorie intake throughout the day with the consumption of 4-5 meals/snacks    Tobacco Cessation Yes    Intervention Assist the participant in steps to quit. Provide  individualized education and counseling about committing to Tobacco Cessation, relapse prevention, and pharmacological support that can be provided by physician.;Education officer, environmental, assist with locating and accessing local/national Quit Smoking programs, and support quit date choice.    Expected Outcomes Short Term: Will demonstrate readiness to quit, by selecting a quit date.;Short Term: Will quit all tobacco product use, adhering to prevention of relapse plan.;Long Term: Complete abstinence from all tobacco products for at least 12 months from quit date.    Diabetes Yes    Intervention Provide education about signs/symptoms and action to take for hypo/hyperglycemia.;Provide education about proper nutrition, including hydration, and aerobic/resistive exercise prescription along with prescribed medications to achieve blood glucose in normal ranges: Fasting glucose 65-99 mg/dL    Expected Outcomes Short Term: Participant verbalizes understanding of the signs/symptoms and immediate care of hyper/hypoglycemia, proper foot care and importance of medication, aerobic/resistive exercise and nutrition plan for blood glucose control.;Long Term:  Attainment of HbA1C < 7%.    Hypertension Yes    Intervention Provide education on lifestyle modifcations including regular physical activity/exercise, weight management, moderate sodium restriction and increased consumption of fresh fruit, vegetables, and low fat dairy, alcohol moderation, and smoking cessation.;Monitor prescription use compliance.    Expected Outcomes Short Term: Continued assessment and intervention until BP is < 140/77mm HG in hypertensive participants. < 130/3mm HG in hypertensive participants with diabetes, heart failure or chronic kidney disease.;Long Term: Maintenance of blood pressure at goal levels.    Lipids Yes    Intervention Provide education and support for participant on nutrition & aerobic/resistive exercise along with prescribed  medications to achieve LDL 70mg , HDL >40mg .    Expected Outcomes Short Term: Participant states understanding of desired cholesterol values and is compliant with medications prescribed. Participant is following exercise prescription and nutrition guidelines.;Long Term: Cholesterol controlled with medications as prescribed, with individualized exercise RX and with personalized nutrition plan. Value goals: LDL < 70mg , HDL > 40 mg.             Education:Diabetes - Individual verbal and written instruction to review signs/symptoms of diabetes, desired ranges of glucose level fasting, after meals and with exercise. Acknowledge that pre and post exercise glucose checks will be done for 3 sessions at entry of program.   Core Components/Risk Factors/Patient Goals Review:    Core Components/Risk Factors/Patient Goals at Discharge (Final Review):    ITP Comments:  ITP Comments     Row Name 04/29/23 1208 05/06/23 1630         ITP Comments Virtual orientation call completed today. he has an appointment on Date: 05/06/2023  for EP eval and gym Orientation.  Documentation of diagnosis can be found in [facility] Date: 03/23/2023 .   Glyn is a current tobacco user. Intervention for tobacco cessation was provided at the initial medical review. He was asked about readiness to quit and reported he his working  on cessation with meds and his physicians help. . Patient was advised and educated about tobacco cessation using combination therapy, tobacco cessation classes, quit line, and quit smoking apps. Patient demonstrated understanding of this material. Staff will continue to provide encouragement and follow up with the patient throughout the program. Completed and gym orientation. Initial ITP created and sent for review to Dr. Bethann Punches, Medical Director. Carvel is a current tobacco user. Intervention for tobacco cessation was provided at the initial medical review. He was asked about readiness to quit and  reported that he is ready and is on medication to assist with it. Patient was advised and educated about tobacco cessation using combination therapy, tobacco cessation classes, quit line, and quit smoking apps. Patient demonstrated understanding of this material. Staff will continue to provide encouragement and follow up with the patient throughout the program.               Comments: Initial ITP

## 2023-05-06 NOTE — Patient Instructions (Signed)
Patient Instructions  Patient Details  Name: Jason Beck MRN: [id number] Date of Birth: 1955-09-30 Referring Provider:  Concha Pyo, FNP  Below are your personal goals for exercise, nutrition, and risk factors. Our goal is to help you stay on track towards obtaining and maintaining these goals. We will be discussing your progress on these goals with you throughout the program.  Initial Exercise Prescription:  Initial Exercise Prescription - 05/06/23 1600       Date of Initial Exercise RX and Referring Provider   Date 05/06/23    Referring Provider Concha Pyo, FNP      Oxygen   Maintain Oxygen Saturation 88% or higher      Recumbant Bike   Level 1    RPM 50    Watts 15    Minutes 15    METs 1.91      NuStep   Level 1    SPM 80    Minutes 15    METs 1.91      Biostep-RELP   Level 1    SPM 50    Minutes 15    METs 1.91      Track   Laps 17    Minutes 15    METs 1.92      Prescription Details   Frequency (times per week) 2    Duration Progress to 30 minutes of continuous aerobic without signs/symptoms of physical distress      Intensity   THRR 40-80% of Max Heartrate 92-132    Ratings of Perceived Exertion 11-13    Perceived Dyspnea 0-4      Progression   Progression Continue to progress workloads to maintain intensity without signs/symptoms of physical distress.      Resistance Training   Training Prescription Yes    Weight 7 lb    Reps 10-15             Exercise Goals: Frequency: Be able to perform aerobic exercise two to three times per week in program working toward 2-5 days per week of home exercise.  Intensity: Work with a perceived exertion of 11 (fairly light) - 15 (hard) while following your exercise prescription.  We will make changes to your prescription with you as you progress through the program.   Duration: Be able to do 30 to 45 minutes of continuous aerobic exercise in addition to a 5 minute warm-up and a 5 minute  cool-down routine.   Nutrition Goals: Your personal nutrition goals will be established when you do your nutrition analysis with the dietician.  The following are general nutrition guidelines to follow: Cholesterol < 200mg /day Sodium < 1500mg /day Fiber: Men over 50 yrs - 30 grams per day  Personal Goals:  Personal Goals and Risk Factors at Admission - 05/06/23 1640       Core Components/Risk Factors/Patient Goals on Admission    Weight Management Yes    Intervention Weight Management: Develop a combined nutrition and exercise program designed to reach desired caloric intake, while maintaining appropriate intake of nutrient and fiber, sodium and fats, and appropriate energy expenditure required for the weight goal.;Weight Management: Provide education and appropriate resources to help participant work on and attain dietary goals.;Weight Management/Obesity: Establish reasonable short term and long term weight goals.    Admit Weight 161 lb 11.2 oz (73.3 kg)    Goal Weight: Short Term 155 lb (70.3 kg)    Goal Weight: Long Term 165 lb (74.8 kg)    Expected Outcomes Short Term:  Continue to assess and modify interventions until short term weight is achieved;Long Term: Adherence to nutrition and physical activity/exercise program aimed toward attainment of established weight goal;Weight Maintenance: Understanding of the daily nutrition guidelines, which includes 25-35% calories from fat, 7% or less cal from saturated fats, less than 200mg  cholesterol, less than 1.5gm of sodium, & 5 or more servings of fruits and vegetables daily;Understanding recommendations for meals to include 15-35% energy as protein, 25-35% energy from fat, 35-60% energy from carbohydrates, less than 200mg  of dietary cholesterol, 20-35 gm of total fiber daily;Understanding of distribution of calorie intake throughout the day with the consumption of 4-5 meals/snacks    Tobacco Cessation Yes    Intervention Assist the participant in  steps to quit. Provide individualized education and counseling about committing to Tobacco Cessation, relapse prevention, and pharmacological support that can be provided by physician.;Education officer, environmental, assist with locating and accessing local/national Quit Smoking programs, and support quit date choice.    Expected Outcomes Short Term: Will demonstrate readiness to quit, by selecting a quit date.;Short Term: Will quit all tobacco product use, adhering to prevention of relapse plan.;Long Term: Complete abstinence from all tobacco products for at least 12 months from quit date.    Diabetes Yes    Intervention Provide education about signs/symptoms and action to take for hypo/hyperglycemia.;Provide education about proper nutrition, including hydration, and aerobic/resistive exercise prescription along with prescribed medications to achieve blood glucose in normal ranges: Fasting glucose 65-99 mg/dL    Expected Outcomes Short Term: Participant verbalizes understanding of the signs/symptoms and immediate care of hyper/hypoglycemia, proper foot care and importance of medication, aerobic/resistive exercise and nutrition plan for blood glucose control.;Long Term: Attainment of HbA1C < 7%.    Hypertension Yes    Intervention Provide education on lifestyle modifcations including regular physical activity/exercise, weight management, moderate sodium restriction and increased consumption of fresh fruit, vegetables, and low fat dairy, alcohol moderation, and smoking cessation.;Monitor prescription use compliance.    Expected Outcomes Short Term: Continued assessment and intervention until BP is < 140/51mm HG in hypertensive participants. < 130/105mm HG in hypertensive participants with diabetes, heart failure or chronic kidney disease.;Long Term: Maintenance of blood pressure at goal levels.    Lipids Yes    Intervention Provide education and support for participant on nutrition & aerobic/resistive exercise  along with prescribed medications to achieve LDL 70mg , HDL >40mg .    Expected Outcomes Short Term: Participant states understanding of desired cholesterol values and is compliant with medications prescribed. Participant is following exercise prescription and nutrition guidelines.;Long Term: Cholesterol controlled with medications as prescribed, with individualized exercise RX and with personalized nutrition plan. Value goals: LDL < 70mg , HDL > 40 mg.             Tobacco Use Initial Evaluation: Social History   Tobacco Use  Smoking Status Every Day   Current packs/day: 0.50   Types: Cigarettes  Smokeless Tobacco Never  Tobacco Comments   Ready to quit: working on quit  taking meds   Counseling given: yes    Exercise Goals and Review:  Exercise Goals     Row Name 05/06/23 1638             Exercise Goals   Increase Physical Activity Yes       Intervention Develop an individualized exercise prescription for aerobic and resistive training based on initial evaluation findings, risk stratification, comorbidities and participant's personal goals.;Provide advice, education, support and counseling about physical activity/exercise needs.  Expected Outcomes Short Term: Attend rehab on a regular basis to increase amount of physical activity.;Long Term: Add in home exercise to make exercise part of routine and to increase amount of physical activity.;Long Term: Exercising regularly at least 3-5 days a week.       Increase Strength and Stamina Yes       Intervention Provide advice, education, support and counseling about physical activity/exercise needs.;Develop an individualized exercise prescription for aerobic and resistive training based on initial evaluation findings, risk stratification, comorbidities and participant's personal goals.       Expected Outcomes Short Term: Increase workloads from initial exercise prescription for resistance, speed, and METs.;Short Term: Perform  resistance training exercises routinely during rehab and add in resistance training at home;Long Term: Improve cardiorespiratory fitness, muscular endurance and strength as measured by increased METs and functional capacity ( )       Able to understand and use rate of perceived exertion (RPE) scale Yes       Intervention Provide education and explanation on how to use RPE scale       Expected Outcomes Short Term: Able to use RPE daily in rehab to express subjective intensity level;Long Term:  Able to use RPE to guide intensity level when exercising independently       Able to understand and use Dyspnea scale Yes       Intervention Provide education and explanation on how to use Dyspnea scale       Expected Outcomes Short Term: Able to use Dyspnea scale daily in rehab to express subjective sense of shortness of breath during exertion;Long Term: Able to use Dyspnea scale to guide intensity level when exercising independently       Knowledge and understanding of Target Heart Rate Range (THRR) Yes       Intervention Provide education and explanation of THRR including how the numbers were predicted and where they are located for reference       Expected Outcomes Short Term: Able to state/look up THRR;Short Term: Able to use daily as guideline for intensity in rehab;Long Term: Able to use THRR to govern intensity when exercising independently       Able to check pulse independently Yes       Intervention Provide education and demonstration on how to check pulse in carotid and radial arteries.;Review the importance of being able to check your own pulse for safety during independent exercise       Expected Outcomes Short Term: Able to explain why pulse checking is important during independent exercise;Long Term: Able to check pulse independently and accurately       Understanding of Exercise Prescription Yes       Intervention Provide education, explanation, and written materials on patient's individual  exercise prescription       Expected Outcomes Short Term: Able to explain program exercise prescription;Long Term: Able to explain home exercise prescription to exercise independently

## 2023-05-18 ENCOUNTER — Encounter: Payer: No Typology Code available for payment source | Admitting: *Deleted

## 2023-05-18 DIAGNOSIS — R0600 Dyspnea, unspecified: Secondary | ICD-10-CM | POA: Diagnosis not present

## 2023-05-18 DIAGNOSIS — Z952 Presence of prosthetic heart valve: Secondary | ICD-10-CM

## 2023-05-18 LAB — GLUCOSE, CAPILLARY
Glucose-Capillary: 200 mg/dL — ABNORMAL HIGH (ref 70–99)
Glucose-Capillary: 106 mg/dL — ABNORMAL HIGH (ref 70–99)

## 2023-05-18 NOTE — Progress Notes (Signed)
Daily Session Note  Patient Details  Name: Jason Beck MRN: [id number] Date of Birth: July 24, 1955 Referring Provider:   Flowsheet Row Cardiac Rehab from 05/06/2023 in [facility] Cardiac and Pulmonary Rehab  Referring Provider Concha Pyo, FNP       Encounter Date: 05/18/2023  Check In:  Session Check In - 05/18/23 1123       Check-In   Supervising physician immediately available to respond to emergencies See telemetry face sheet for immediately available ER MD    Location [facility]-Cardiac & Pulmonary Rehab    Staff Present Rory Percy, MS, Exercise Physiologist;Maxon Conetta BS, Exercise Physiologist;Jason Wallace Cullens RDN,LDN;Kaylia Winborne, RN, BSN, CCRP;Meredith Craven RN,BSN;Noah Tickle, BS, Exercise Physiologist    Virtual Visit No    Medication changes reported     No    Fall or balance concerns reported    No    Warm-up and Cool-down Performed on first and last piece of equipment    Resistance Training Performed Yes    VAD Patient? No    PAD/SET Patient? No      Pain Assessment   Currently in Pain? No/denies                Social History   Tobacco Use  Smoking Status Every Day   Current packs/day: 0.50   Types: Cigarettes  Smokeless Tobacco Never  Tobacco Comments   Ready to quit: working on quit  taking meds   Counseling given: yes    Goals Met:  Exercise tolerated well Personal goals reviewed No report of concerns or symptoms today  Goals Unmet:  Not Applicable  Comments: First full day of exercise!  Patient was oriented to gym and equipment including functions, settings, policies, and procedures.  Patient's individual exercise prescription and treatment plan were reviewed.  All starting workloads were established based on the results of the 6 minute walk test done at initial orientation visit.  The plan for exercise progression was also introduced and progression will be customized based on patient's performance and goals. Pt able to follow exercise  prescription today without complaint.  Will continue to monitor for progression.    Dr. Bethann Punches is Medical Director for [facility] Cardiac Rehabilitation.  Dr. Vida Rigger is Medical Director for [facility] Pulmonary Rehabilitation.

## 2023-05-20 ENCOUNTER — Encounter: Payer: No Typology Code available for payment source | Admitting: *Deleted

## 2023-05-20 DIAGNOSIS — R0602 Shortness of breath: Secondary | ICD-10-CM

## 2023-05-20 DIAGNOSIS — R0600 Dyspnea, unspecified: Secondary | ICD-10-CM | POA: Diagnosis not present

## 2023-05-20 DIAGNOSIS — Z952 Presence of prosthetic heart valve: Secondary | ICD-10-CM

## 2023-05-20 LAB — GLUCOSE, CAPILLARY
Glucose-Capillary: 209 mg/dL — ABNORMAL HIGH (ref 70–99)
Glucose-Capillary: 136 mg/dL — ABNORMAL HIGH (ref 70–99)

## 2023-05-20 NOTE — Progress Notes (Signed)
Daily Session Note  Patient Details  Name: Jason Beck MRN: [id number] Date of Birth: February 25, 1956 Referring Provider:   Flowsheet Row Cardiac Rehab from 05/06/2023 in [facility] Cardiac and Pulmonary Rehab  Referring Provider Concha Pyo, FNP       Encounter Date: 05/20/2023  Check In:  Session Check In - 05/20/23 1155       Check-In   Supervising physician immediately available to respond to emergencies See telemetry face sheet for immediately available ER MD    Location [facility]-Cardiac & Pulmonary Rehab    Staff Present Cora Collum, RN, BSN, CCRP;Meredith Jewel Baize RN,BSN;Noah Tickle, BS, Exercise Physiologist;Joseph Hood RCP,RRT,BSRT    Virtual Visit No    Medication changes reported     No    Fall or balance concerns reported    No    Warm-up and Cool-down Performed on first and last piece of equipment    Resistance Training Performed Yes    VAD Patient? No    PAD/SET Patient? No      Pain Assessment   Currently in Pain? No/denies                Social History   Tobacco Use  Smoking Status Every Day   Current packs/day: 0.50   Types: Cigarettes  Smokeless Tobacco Never  Tobacco Comments   Ready to quit: working on quit  taking meds   Counseling given: yes    Goals Met:  Independence with exercise equipment Exercise tolerated well No report of concerns or symptoms today  Goals Unmet:  Not Applicable  Comments: Pt able to follow exercise prescription today without complaint.  Will continue to monitor for progression.    Dr. Bethann Punches is Medical Director for [facility] Cardiac Rehabilitation.  Dr. Vida Rigger is Medical Director for [facility] Pulmonary Rehabilitation.

## 2023-05-25 ENCOUNTER — Encounter: Payer: No Typology Code available for payment source | Attending: Nurse Practitioner | Admitting: *Deleted

## 2023-05-25 DIAGNOSIS — Z48812 Encounter for surgical aftercare following surgery on the circulatory system: Secondary | ICD-10-CM | POA: Insufficient documentation

## 2023-05-25 DIAGNOSIS — Z955 Presence of coronary angioplasty implant and graft: Secondary | ICD-10-CM | POA: Diagnosis not present

## 2023-05-25 DIAGNOSIS — R0602 Shortness of breath: Secondary | ICD-10-CM | POA: Diagnosis present

## 2023-05-25 DIAGNOSIS — I252 Old myocardial infarction: Secondary | ICD-10-CM | POA: Insufficient documentation

## 2023-05-25 DIAGNOSIS — Z952 Presence of prosthetic heart valve: Secondary | ICD-10-CM | POA: Diagnosis present

## 2023-05-25 DIAGNOSIS — R0600 Dyspnea, unspecified: Secondary | ICD-10-CM | POA: Diagnosis present

## 2023-05-25 NOTE — Progress Notes (Signed)
 Daily Session Note  Patient Details  Name: Jason Beck MRN: [id number] Date of Birth: 05/19/1955 Referring Provider:   Flowsheet Row Cardiac Rehab from 05/06/2023 in [facility] Cardiac and Pulmonary Rehab  Referring Provider Naaman Norris, FNP       Encounter Date: 05/25/2023  Check In:  Session Check In - 05/25/23 1120       Check-In   Supervising physician immediately available to respond to emergencies See telemetry face sheet for immediately available ER MD    Location [facility]-Cardiac & Pulmonary Rehab    Staff Present Rollene Paterson, MS, Exercise Physiologist;Keshona Kartes, RN, BSN, CCRP;Maxon Conetta BS, Exercise Physiologist;Jason Elnor RDN,LDN;Meredith Craven RN,BSN;Noah Tickle, BS, Exercise Physiologist    Virtual Visit No    Medication changes reported     No    Fall or balance concerns reported    No    Warm-up and Cool-down Performed on first and last piece of equipment    Resistance Training Performed Yes    VAD Patient? No    PAD/SET Patient? No      Pain Assessment   Currently in Pain? No/denies                Social History   Tobacco Use  Smoking Status Every Day   Current packs/day: 0.50   Types: Cigarettes  Smokeless Tobacco Never  Tobacco Comments   Ready to quit: working on quit  taking meds   Counseling given: yes    Goals Met:  Independence with exercise equipment Exercise tolerated well No report of concerns or symptoms today  Goals Unmet:  Not Applicable  Comments: Pt able to follow exercise prescription today without complaint.  Will continue to monitor for progression.    Dr. Oneil Pinal is Medical Director for [facility] Cardiac Rehabilitation.  Dr. Fuad Aleskerov is Medical Director for [facility] Pulmonary Rehabilitation.

## 2023-05-27 ENCOUNTER — Encounter: Payer: No Typology Code available for payment source | Admitting: *Deleted

## 2023-05-27 DIAGNOSIS — R0602 Shortness of breath: Secondary | ICD-10-CM

## 2023-05-27 DIAGNOSIS — Z952 Presence of prosthetic heart valve: Secondary | ICD-10-CM | POA: Diagnosis not present

## 2023-05-27 DIAGNOSIS — R0600 Dyspnea, unspecified: Secondary | ICD-10-CM

## 2023-05-27 NOTE — Progress Notes (Signed)
 Daily Session Note  Patient Details  Name: Jason Beck MRN: [id number] Date of Birth: [birth year] Referring Provider:   Flowsheet Row Cardiac Rehab from 05/06/2023 in [facility] Cardiac and Pulmonary Rehab  Referring Provider Naaman Norris, FNP       Encounter Date: 05/27/2023  Check In:  Session Check In - 05/27/23 1121       Check-In   Supervising physician immediately available to respond to emergencies See telemetry face sheet for immediately available ER MD    Location [facility]-Cardiac & Pulmonary Rehab    Staff Present Othel Durand, RN, BSN, CCRP;Meredith Tressa RN,BSN;Noah Tickle, BS, Exercise Physiologist;Maxon Conetta BS, Exercise Physiologist;Joseph Hood RCP,RRT,BSRT    Virtual Visit No    Medication changes reported     No    Fall or balance concerns reported    No    Warm-up and Cool-down Performed on first and last piece of equipment    Resistance Training Performed Yes    VAD Patient? No    PAD/SET Patient? No      Pain Assessment   Currently in Pain? No/denies                Social History   Tobacco Use  Smoking Status Every Day   Current packs/day: 0.50   Types: Cigarettes  Smokeless Tobacco Never  Tobacco Comments   Ready to quit: working on quit  taking meds   Counseling given: yes    Goals Met:  Independence with exercise equipment Exercise tolerated well No report of concerns or symptoms today  Goals Unmet:  Not Applicable  Comments: Pt able to follow exercise prescription today without complaint.  Will continue to monitor for progression.    Dr. Oneil Pinal is Medical Director for [facility] Cardiac Rehabilitation.  Dr. Fuad Aleskerov is Medical Director for [facility] Pulmonary Rehabilitation.

## 2023-06-01 ENCOUNTER — Encounter: Payer: No Typology Code available for payment source | Admitting: *Deleted

## 2023-06-01 DIAGNOSIS — Z952 Presence of prosthetic heart valve: Secondary | ICD-10-CM

## 2023-06-01 DIAGNOSIS — R0600 Dyspnea, unspecified: Secondary | ICD-10-CM

## 2023-06-01 DIAGNOSIS — R0602 Shortness of breath: Secondary | ICD-10-CM

## 2023-06-01 NOTE — Progress Notes (Signed)
Daily Session Note  Patient Details  Name: Jason Beck MRN: [id number] Date of Birth: 04-Mar-1956 Referring Provider:   Flowsheet Row Cardiac Rehab from 05/06/2023 in [facility]  Referring Provider Concha Pyo, FNP       Encounter Date: 06/01/2023  Check In:  Session Check In - 06/01/23 1118       Check-In   Supervising physician immediately available to respond to emergencies See telemetry face sheet for immediately available ER MD    Location [facility]-Card[facility]    Staff Present Cora Collum, RN, BSN, CCRP;Margaret Best, MS, Exercise Physiologist;Maxon Conetta BS, Exercise Physiologist;Jason Wallace Cullens RDN,LDN;Meredith Craven RN,BSN;Noah Tickle, BS, Exercise Physiologist    Virtual Visit No    Medication changes reported     No    Fall or balance concerns reported    No    Warm-up and Cool-down Performed on first and last piece of equipment    Resistance Training Performed Yes    VAD Patient? No    PAD/SET Patient? No      Pain Assessment   Currently in Pain? No/denies                Social History   Tobacco Use  Smoking Status Every Day   Current packs/day: 0.50   Types: Cigarettes  Smokeless Tobacco Never  Tobacco Comments   Ready to quit: working on quit  taking meds   Counseling given: yes    Goals Met:  Independence with exercise equipment Exercise tolerated well No report of concerns or symptoms today  Goals Unmet:  Not Applicable  Comments: Pt able to follow exercise prescription today without complaint.  Will continue to monitor for progression.    Dr. Bethann Punches is Medical Director for [facility] Card[facility].  Dr. Vida Rigger is Medical Director for [facility] Pul[facility].

## 2023-06-02 DIAGNOSIS — Z952 Presence of prosthetic heart valve: Secondary | ICD-10-CM

## 2023-06-02 NOTE — Progress Notes (Signed)
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 So he hopes getting back to working in his shop will help destress him as well. He notes his back pain and leg pain prevent him from being able to do all he wants to do, but he thinks the program is helping him strengthen his muscles some.    Expected Outcomes Short: start working in his shop Long; develop and maintain positive self care habits    Continue Psychosocial Services  Follow up required by staff             Vocational Rehabilitation: Provide vocational rehab assistance to qualifying candidates.   Vocational Rehab Evaluation & Intervention:  Vocational Rehab - 06/01/23 1310       Initial Vocational Rehab  Evaluation & Intervention   Assessment shows need for Vocational Rehabilitation No             Education: Education Goals: Education classes will be provided on a variety of topics geared toward better understanding of heart health and risk factor modification. Participant will state understanding/return demonstration of topics presented as noted by education test scores.  Learning Barriers/Preferences:   General Cardiac Education Topics:  AED/CPR: - Group verbal and written instruction with the use of models to demonstrate the basic use of the AED with the basic ABC's of resuscitation.   Anatomy and Cardiac Procedures: - Group verbal and visual presentation and models provide information about basic cardiac anatomy and function. Reviews the testing methods done to diagnose heart disease and the outcomes of the test results. Describes the treatment choices: Medical Management, Angioplasty, or Coronary Bypass Surgery for treating various heart conditions including Myocardial Infarction, Angina, Valve Disease, and Cardiac Arrhythmias.  Written material given at graduation.   Medication Safety: - Group verbal and visual instruction to review commonly prescribed medications for heart and lung disease. Reviews the medication, class of the drug, and side effects. Includes the steps to properly store meds and maintain the prescription regimen.  Written material given at graduation.   Intimacy: - Group verbal instruction through game format to discuss how heart and lung disease can affect sexual intimacy. Written material given at graduation..   Know Your Numbers and Heart Failure: - Group verbal and visual instruction to discuss disease risk factors for cardiac and pulmonary disease and treatment options.  Reviews associated critical values for Overweight/Obesity, Hypertension, Cholesterol, and Diabetes.  Discusses basics of heart failure: signs/symptoms and treatments.  Introduces Heart  Failure Zone chart for action plan for heart failure.  Written material given at graduation.   Infection Prevention: - Provides verbal and written material to individual with discussion of infection control including proper hand washing and proper equipment cleaning during exercise session. Flowsheet Row Cardiac Rehab from 05/06/2023 in [facility] Cardiac and Pulmonary Rehab  Date 05/06/23  Educator MB  Instruction Review Code 1- Verbalizes Understanding       Falls Prevention: - Provides verbal and written material to individual with discussion of falls prevention and safety. Flowsheet Row Cardiac Rehab from 05/06/2023 in [facility] Cardiac and Pulmonary Rehab  Date 05/06/23  Educator MB  Instruction Review Code 2- Demonstrated Understanding       Other: -Provides group and verbal instruction on various topics (see comments)   Knowledge Questionnaire  Score:  Knowledge Questionnaire Score - 05/20/23 0747       Knowledge Questionnaire Score   Pre Score 14/18    Post Score 14/18 error  was pre score             Core Components/Risk Factors/Patient Goals at Admission:  Personal Goals and Risk Factors at Admission - 05/06/23 1640       Core Components/Risk Factors/Patient Goals on Admission    Weight Management Yes    Intervention Weight Management: Develop a combined nutrition and exercise program designed to reach desired caloric intake, while maintaining appropriate intake of nutrient and fiber, sodium and fats, and appropriate energy expenditure required for the weight goal.;Weight Management: Provide education and appropriate resources to help participant work on and attain dietary goals.;Weight Management/Obesity: Establish reasonable short term and long term weight goals.    Admit Weight 161 lb 11.2 oz (73.3 kg)    Goal Weight: Short Term 155 lb (70.3 kg)    Goal Weight: Long Term 165 lb (74.8 kg)    Expected Outcomes Short Term: Continue to assess and modify interventions  until short term weight is achieved;Long Term: Adherence to nutrition and physical activity/exercise program aimed toward attainment of established weight goal;Weight Maintenance: Understanding of the daily nutrition guidelines, which includes 25-35% calories from fat, 7% or less cal from saturated fats, less than 200mg  cholesterol, less than 1.5gm of sodium, & 5 or more servings of fruits and vegetables daily;Understanding recommendations for meals to include 15-35% energy as protein, 25-35% energy from fat, 35-60% energy from carbohydrates, less than 200mg  of dietary cholesterol, 20-35 gm of total fiber daily;Understanding of distribution of calorie intake throughout the day with the consumption of 4-5 meals/snacks    Tobacco Cessation Yes    Intervention Assist the participant in steps to quit. Provide individualized education and counseling about committing to Tobacco Cessation, relapse prevention, and pharmacological support that can be provided by physician.;Education officer, environmental, assist with locating and accessing local/national Quit Smoking programs, and support quit date choice.    Expected Outcomes Short Term: Will demonstrate readiness to quit, by selecting a quit date.;Short Term: Will quit all tobacco product use, adhering to prevention of relapse plan.;Long Term: Complete abstinence from all tobacco products for at least 12 months from quit date.    Diabetes Yes    Intervention Provide education about signs/symptoms and action to take for hypo/hyperglycemia.;Provide education about proper nutrition, including hydration, and aerobic/resistive exercise prescription along with prescribed medications to achieve blood glucose in normal ranges: Fasting glucose 65-99 mg/dL    Expected Outcomes Short Term: Participant verbalizes understanding of the signs/symptoms and immediate care of hyper/hypoglycemia, proper foot care and importance of medication, aerobic/resistive exercise and nutrition plan  for blood glucose control.;Long Term: Attainment of HbA1C < 7%.    Hypertension Yes    Intervention Provide education on lifestyle modifcations including regular physical activity/exercise, weight management, moderate sodium restriction and increased consumption of fresh fruit, vegetables, and low fat dairy, alcohol moderation, and smoking cessation.;Monitor prescription use compliance.    Expected Outcomes Short Term: Continued assessment and intervention until BP is < 140/67mm HG in hypertensive participants. < 130/70mm HG in hypertensive participants with diabetes, heart failure or chronic kidney disease.;Long Term: Maintenance of blood pressure at goal levels.    Lipids Yes    Intervention Provide education and support for participant on nutrition & aerobic/resistive exercise along with prescribed medications to achieve LDL 70mg , HDL >40mg .    Expected Outcomes Short Term: Participant  states understanding of desired cholesterol values and is compliant with medications prescribed. Participant is following exercise prescription and nutrition guidelines.;Long Term: Cholesterol controlled with medications as prescribed, with individualized exercise RX and with personalized nutrition plan. Value goals: LDL < 70mg , HDL > 40 mg.             Education:Diabetes - Individual verbal and written instruction to review signs/symptoms of diabetes, desired ranges of glucose level fasting, after meals and with exercise. Acknowledge that pre and post exercise glucose checks will be done for 3 sessions at entry of program.   Core Components/Risk Factors/Patient Goals Review:   Goals and Risk Factor Review     Row Name 06/01/23 1302             Core Components/Risk Factors/Patient Goals Review   Personal Goals Review Diabetes;Tobacco Cessation       Review Jethro has been enjoying the program so far. He has been trying to cut back on his smoking with lifestyle changes and medication, but he states his  stress level is high and smoking is the one thing that helps calm him down. He wants to keep trying and plans to explore other relaxing activities. He also mentions that his sugar has been steady in the 130s when checks it sporadically. He mentions using peanut butter as his way to get his sugar up when he does feel it drop, education was provided about the 15-15 rule, checking his sugar if available, and using 15 grams of fast acting and checking his sugar in 15 minutes. He says he keeps orange juice in the fridge and will try that if it happens again.       Expected Outcomes Short: check blood sugar consistently. Long: set goal for quit date for smoking                Core Components/Risk Factors/Patient Goals at Discharge (Final Review):   Goals and Risk Factor Review - 06/01/23 1302       Core Components/Risk Factors/Patient Goals Review   Personal Goals Review Diabetes;Tobacco Cessation    Review Daishaun has been enjoying the program so far. He has been trying to cut back on his smoking with lifestyle changes and medication, but he states his stress level is high and smoking is the one thing that helps calm him down. He wants to keep trying and plans to explore other relaxing activities. He also mentions that his sugar has been steady in the 130s when checks it sporadically. He mentions using peanut butter as his way to get his sugar up when he does feel it drop, education was provided about the 15-15 rule, checking his sugar if available, and using 15 grams of fast acting and checking his sugar in 15 minutes. He says he keeps orange juice in the fridge and will try that if it happens again.    Expected Outcomes Short: check blood sugar consistently. Long: set goal for quit date for smoking             ITP Comments:  ITP Comments     Row Name 04/29/23 1208 05/06/23 1630 05/18/23 1124 06/02/23 0831     ITP Comments Virtual orientation call completed today. he has an appointment on  Date: 05/06/2023  for EP eval and gym Orientation.  Documentation of diagnosis can be found in [facility] Date: 03/23/2023 .   Candice is a current tobacco user. Intervention for tobacco cessation was provided at the initial medical review. He was  asked about readiness to quit and reported he his working  on cessation with meds and his physicians help. . Patient was advised and educated about tobacco cessation using combination therapy, tobacco cessation classes, quit line, and quit smoking apps. Patient demonstrated understanding of this material. Staff will continue to provide encouragement and follow up with the patient throughout the program. Completed and gym orientation. Initial ITP created and sent for review to Dr. Bethann Punches, Medical Director. Jasin is a current tobacco user. Intervention for tobacco cessation was provided at the initial medical review. He was asked about readiness to quit and reported that he is ready and is on medication to assist with it. Patient was advised and educated about tobacco cessation using combination therapy, tobacco cessation classes, quit line, and quit smoking apps. Patient demonstrated understanding of this material. Staff will continue to provide encouragement and follow up with the patient throughout the program. First full day of exercise!  Patient was oriented to gym and equipment including functions, settings, policies, and procedures.  Patient's individual exercise prescription and treatment plan were reviewed.  All starting workloads were established based on the results of the 6 minute walk test done at initial orientation visit.  The plan for exercise progression was also introduced and progression will be customized based on patient's performance and goals. 30 Day review completed. Medical Director ITP review done, changes made as directed, and signed approval by Medical Director. New Patient             Comments: 30 day review

## 2023-06-03 ENCOUNTER — Encounter: Payer: No Typology Code available for payment source | Admitting: *Deleted

## 2023-06-03 DIAGNOSIS — Z952 Presence of prosthetic heart valve: Secondary | ICD-10-CM | POA: Diagnosis not present

## 2023-06-03 DIAGNOSIS — R0602 Shortness of breath: Secondary | ICD-10-CM

## 2023-06-03 DIAGNOSIS — R0600 Dyspnea, unspecified: Secondary | ICD-10-CM

## 2023-06-03 NOTE — Progress Notes (Signed)
Daily Session Note  Patient Details  Name: Jason Beck MRN: [id number] Date of Birth: 1956-01-23 Referring Provider:   Flowsheet Row Cardiac Rehab from 05/06/2023 in [facility] Cardiac and Pulmonary Rehab  Referring Provider Concha Pyo, FNP       Encounter Date: 06/03/2023  Check In:  Session Check In - 06/03/23 1122       Check-In   Supervising physician immediately available to respond to emergencies See telemetry face sheet for immediately available ER MD    Location [facility]-Cardiac & Pulmonary Rehab    Staff Present Cora Collum, RN, BSN, CCRP;Meredith Craven RN,BSN;Noah Tickle, BS, Exercise Physiologist;Joseph Hood RCP,RRT,BSRT    Virtual Visit No    Medication changes reported     No    Fall or balance concerns reported    No    Tobacco Cessation Use Increase    Current number of cigarettes/nicotine per day     5    Warm-up and Cool-down Performed on first and last piece of equipment    Resistance Training Performed Yes    VAD Patient? No    PAD/SET Patient? No      Pain Assessment   Currently in Pain? No/denies                Social History   Tobacco Use  Smoking Status Every Day   Current packs/day: 0.50   Types: Cigarettes  Smokeless Tobacco Never  Tobacco Comments   Ready to quit: working on quit  taking meds   Counseling given: yes    Goals Met:  Independence with exercise equipment Exercise tolerated well No report of concerns or symptoms today  Goals Unmet:  Not Applicable  Comments: Pt able to follow exercise prescription today without complaint.  Will continue to monitor for progression.    Dr. Bethann Punches is Medical Director for [facility] Cardiac Rehabilitation.  Dr. Vida Rigger is Medical Director for [facility] Pulmonary Rehabilitation.

## 2023-06-08 ENCOUNTER — Encounter: Payer: No Typology Code available for payment source | Admitting: *Deleted

## 2023-06-08 DIAGNOSIS — R0602 Shortness of breath: Secondary | ICD-10-CM

## 2023-06-08 DIAGNOSIS — R0600 Dyspnea, unspecified: Secondary | ICD-10-CM

## 2023-06-08 DIAGNOSIS — Z952 Presence of prosthetic heart valve: Secondary | ICD-10-CM

## 2023-06-08 NOTE — Progress Notes (Signed)
Daily Session Note  Patient Details  Name: Jason Beck MRN: [id number] Date of Birth: 09-25-1955 Referring Provider:   Flowsheet Row Cardiac Rehab from 05/06/2023 in [facility] Cardiac and Pulmonary Rehab  Referring Provider Concha Pyo, FNP       Encounter Date: 06/08/2023  Check In:  Session Check In - 06/08/23 1129       Check-In   Supervising physician immediately available to respond to emergencies See telemetry face sheet for immediately available ER MD    Location [facility]-Cardiac & Pulmonary Rehab    Staff Present Cora Collum, RN, BSN, CCRP;Meredith Jewel Baize RN,BSN;Noah Tickle, BS, Exercise Physiologist;Margaret Best, MS, Exercise Physiologist;Maxon Conetta BS, Exercise Physiologist;Joseph Reino Kent RCP,RRT,BSRT    Virtual Visit No    Medication changes reported     No    Fall or balance concerns reported    No    Warm-up and Cool-down Performed on first and last piece of equipment    Resistance Training Performed Yes    VAD Patient? No    PAD/SET Patient? No      Pain Assessment   Currently in Pain? No/denies                Social History   Tobacco Use  Smoking Status Every Day   Current packs/day: 0.50   Types: Cigarettes  Smokeless Tobacco Never  Tobacco Comments   Ready to quit: working on quit  taking meds   Counseling given: yes    Goals Met:  Independence with exercise equipment Exercise tolerated well No report of concerns or symptoms today  Goals Unmet:  Not Applicable  Comments: Pt able to follow exercise prescription today without complaint.  Will continue to monitor for progression.    Dr. Bethann Punches is Medical Director for [facility] Cardiac Rehabilitation.  Dr. Vida Rigger is Medical Director for [facility] Pulmonary Rehabilitation.

## 2023-06-11 ENCOUNTER — Ambulatory Visit (INDEPENDENT_AMBULATORY_CARE_PROVIDER_SITE_OTHER): Payer: Self-pay | Admitting: Podiatry

## 2023-06-11 DIAGNOSIS — Z91199 Patient's noncompliance with other medical treatment and regimen due to unspecified reason: Secondary | ICD-10-CM

## 2023-06-11 NOTE — Progress Notes (Signed)
 1. No-show for appointment

## 2023-06-15 ENCOUNTER — Encounter: Payer: No Typology Code available for payment source | Admitting: *Deleted

## 2023-06-15 DIAGNOSIS — Z952 Presence of prosthetic heart valve: Secondary | ICD-10-CM | POA: Diagnosis not present

## 2023-06-15 DIAGNOSIS — R0602 Shortness of breath: Secondary | ICD-10-CM

## 2023-06-15 NOTE — Progress Notes (Signed)
 Daily Session Note  Patient Details  Name: Jason Beck MRN: [id number] Date of Birth: 04/13/1956 Referring Provider:   Flowsheet Row Cardiac Rehab from 05/06/2023 in [facility] Cardiac and Pulmonary Rehab  Referring Provider Concha Pyo, FNP       Encounter Date: 06/15/2023  Check In:  Session Check In - 06/15/23 1118       Check-In   Supervising physician immediately available to respond to emergencies See telemetry face sheet for immediately available ER MD    Location [facility]-Cardiac & Pulmonary Rehab    Staff Present Rory Percy, MS, Exercise Physiologist;Jorgina Binning, RN, BSN, CCRP;Meredith Craven RN,BSN;Maxon [employer], Exercise Physiologist;Noah Tickle, BS, Exercise Physiologist    Virtual Visit No    Medication changes reported     No    Fall or balance concerns reported    No    Warm-up and Cool-down Performed on first and last piece of equipment    Resistance Training Performed Yes    VAD Patient? No    PAD/SET Patient? No      Pain Assessment   Currently in Pain? No/denies             New pain med for his back  along with all other meds   Social History   Tobacco Use  Smoking Status Every Day   Current packs/day: 0.50   Types: Cigarettes  Smokeless Tobacco Never  Tobacco Comments   Ready to quit: working on quit  taking meds   Counseling given: yes    Goals Met:  Independence with exercise equipment Exercise tolerated well No report of concerns or symptoms today  Goals Unmet:  Not Applicable  Comments: Pt able to follow exercise prescription today without complaint.  Will continue to monitor for progression.    Dr. Bethann Punches is Medical Director for [facility] Cardiac Rehabilitation.  Dr. Vida Rigger is Medical Director for [facility] Pulmonary Rehabilitation.

## 2023-06-17 ENCOUNTER — Encounter: Payer: No Typology Code available for payment source | Admitting: *Deleted

## 2023-06-17 DIAGNOSIS — Z952 Presence of prosthetic heart valve: Secondary | ICD-10-CM | POA: Diagnosis not present

## 2023-06-17 NOTE — Progress Notes (Signed)
 Daily Session Note  Patient Details  Name: ALISTAR MCENERY MRN: [id number] Date of Birth: [birth year] Referring Provider:   Flowsheet Row Cardiac Rehab from 05/06/2023 in [facility] Cardiac and Pulmonary Rehab  Referring Provider Concha Pyo, FNP       Encounter Date: 06/17/2023  Check In:  Session Check In - 06/17/23 1114       Check-In   Supervising physician immediately available to respond to emergencies See telemetry face sheet for immediately available ER MD    Location [facility]-Cardiac & Pulmonary Rehab    Staff Present Susann Givens RN,BSN;Joseph [facility] [place], [place], Exercise Physiologist;Jason Wallace Cullens RDN,LDN    Virtual Visit No    Medication changes reported     No    Fall or balance concerns reported    No    Tobacco Cessation No Change    Current number of cigarettes/nicotine per day     5    Warm-up and Cool-down Performed on first and last piece of equipment    Resistance Training Performed Yes    VAD Patient? No    PAD/SET Patient? No                Social History   Tobacco Use  Smoking Status Every Day   Current packs/day: 0.50   Types: Cigarettes  Smokeless Tobacco Never  Tobacco Comments   Ready to quit: working on quit  taking meds   Counseling given: yes    Goals Met:  Independence with exercise equipment Exercise tolerated well No report of concerns or symptoms today Strength training completed today  Goals Unmet:  Not Applicable  Comments: Pt able to follow exercise prescription today without complaint.  Will continue to monitor for progression.    Dr. Bethann Punches is Medical Director for [facility] Cardiac Rehabilitation.  Dr. Vida Rigger is Medical Director for [facility] Pulmonary Rehabilitation.

## 2023-06-22 ENCOUNTER — Encounter: Payer: No Typology Code available for payment source | Attending: Nurse Practitioner | Admitting: *Deleted

## 2023-06-22 DIAGNOSIS — R0600 Dyspnea, unspecified: Secondary | ICD-10-CM | POA: Diagnosis present

## 2023-06-22 DIAGNOSIS — R0602 Shortness of breath: Secondary | ICD-10-CM | POA: Diagnosis present

## 2023-06-22 DIAGNOSIS — Z952 Presence of prosthetic heart valve: Secondary | ICD-10-CM | POA: Insufficient documentation

## 2023-06-22 DIAGNOSIS — Z48812 Encounter for surgical aftercare following surgery on the circulatory system: Secondary | ICD-10-CM | POA: Diagnosis not present

## 2023-06-22 DIAGNOSIS — Z955 Presence of coronary angioplasty implant and graft: Secondary | ICD-10-CM | POA: Diagnosis not present

## 2023-06-22 DIAGNOSIS — I252 Old myocardial infarction: Secondary | ICD-10-CM | POA: Insufficient documentation

## 2023-06-22 NOTE — Progress Notes (Signed)
 Daily Session Note  Patient Details  Name: Jason Beck MRN: [id number] Date of Birth: 1955-12-06 Referring Provider:   Flowsheet Row Cardiac Rehab from 05/06/2023 in [facility] Cardiac and Pulmonary Rehab  Referring Provider Concha Pyo, FNP       Encounter Date: 06/22/2023  Check In:  Session Check In - 06/22/23 1132       Check-In   Supervising physician immediately available to respond to emergencies See telemetry face sheet for immediately available ER MD    Location [facility]-Cardiac & Pulmonary Rehab    Staff Present Cora Collum, RN, BSN, CCRP;Margaret Best, MS, Exercise Physiologist;Jason Wallace Cullens RDN,LDN;Noah Tickle, BS, Exercise Physiologist;Meredith Jewel Baize RN,BSN    Virtual Visit No    Medication changes reported     No    Fall or balance concerns reported    No    Warm-up and Cool-down Performed on first and last piece of equipment    Resistance Training Performed Yes    VAD Patient? No    PAD/SET Patient? No      Pain Assessment   Currently in Pain? No/denies                Social History   Tobacco Use  Smoking Status Every Day   Current packs/day: 0.50   Types: Cigarettes  Smokeless Tobacco Never  Tobacco Comments   Ready to quit: working on quit  taking meds   Counseling given: yes    Goals Met:  Independence with exercise equipment Exercise tolerated well No report of concerns or symptoms today  Goals Unmet:  Not Applicable  Comments: Pt able to follow exercise prescription today without complaint.  Will continue to monitor for progression.    Dr. Bethann Punches is Medical Director for [facility] Cardiac Rehabilitation.  Dr. Vida Rigger is Medical Director for [facility] Pulmonary Rehabilitation.

## 2023-06-24 ENCOUNTER — Encounter: Payer: No Typology Code available for payment source | Admitting: *Deleted

## 2023-06-24 DIAGNOSIS — R0600 Dyspnea, unspecified: Secondary | ICD-10-CM

## 2023-06-24 DIAGNOSIS — R0602 Shortness of breath: Secondary | ICD-10-CM

## 2023-06-24 DIAGNOSIS — Z48812 Encounter for surgical aftercare following surgery on the circulatory system: Secondary | ICD-10-CM | POA: Diagnosis not present

## 2023-06-24 DIAGNOSIS — Z952 Presence of prosthetic heart valve: Secondary | ICD-10-CM

## 2023-06-24 NOTE — Progress Notes (Signed)
 Daily Session Note  Patient Details  Name: Jason Beck MRN: [id number] Date of Birth: 1956-03-09 Referring Provider:   Flowsheet Row Cardiac Rehab from 05/06/2023 in [facility] Cardiac and Pulmonary Rehab  Referring Provider Concha Pyo, FNP       Encounter Date: 06/24/2023  Check In:  Session Check In - 06/24/23 1150       Check-In   Supervising physician immediately available to respond to emergencies See telemetry face sheet for immediately available ER MD    Location [facility]-Cardiac & Pulmonary Rehab    Staff Present Cora Collum, RN, BSN, CCRP;Margaret Best, MS, Exercise Physiologist;Joseph Reino Kent RCP,RRT,BSRT;Meredith Jewel Baize RN,BSN    Virtual Visit No    Medication changes reported     No    Fall or balance concerns reported    No    Warm-up and Cool-down Performed on first and last piece of equipment    Resistance Training Performed Yes    VAD Patient? No    PAD/SET Patient? No      Pain Assessment   Currently in Pain? No/denies                Social History   Tobacco Use  Smoking Status Every Day   Current packs/day: 0.50   Types: Cigarettes  Smokeless Tobacco Never  Tobacco Comments   Ready to quit: working on quit  taking meds   Counseling given: yes    Goals Met:  Independence with exercise equipment Exercise tolerated well No report of concerns or symptoms today  Goals Unmet:  Not Applicable  Comments: Pt able to follow exercise prescription today without complaint.  Will continue to monitor for progression.    Dr. Bethann Punches is Medical Director for [facility] Cardiac Rehabilitation.  Dr. Vida Rigger is Medical Director for [facility] Pulmonary Rehabilitation.

## 2023-06-28 ENCOUNTER — Ambulatory Visit: Payer: Self-pay | Admitting: Podiatry

## 2023-06-29 ENCOUNTER — Encounter: Payer: No Typology Code available for payment source | Admitting: *Deleted

## 2023-06-29 DIAGNOSIS — R0602 Shortness of breath: Secondary | ICD-10-CM

## 2023-06-29 DIAGNOSIS — Z952 Presence of prosthetic heart valve: Secondary | ICD-10-CM

## 2023-06-29 DIAGNOSIS — Z48812 Encounter for surgical aftercare following surgery on the circulatory system: Secondary | ICD-10-CM | POA: Diagnosis not present

## 2023-06-29 NOTE — Progress Notes (Signed)
 Daily Session Note  Patient Details  Name: Jason Beck MRN: [id number] Date of Birth: Jun 09, 1955 Referring Provider:   Flowsheet Row Cardiac Rehab from 05/06/2023 in [facility]  Referring Provider Concha Pyo, FNP       Encounter Date: 06/29/2023  Check In:  Session Check In - 06/29/23 1135       Check-In   Supervising physician immediately available to respond to emergencies See telemetry face sheet for immediately available ER MD    Location [facility]    Staff Present Cora Collum, RN, BSN, CCRP;Laureen Manson Passey, BS, RRT, CPFT;Margaret Best, MS, Exercise Physiologist;Noah Tickle, BS, Exercise Physiologist    Virtual Visit No    Medication changes reported     No    Fall or balance concerns reported    No    Warm-up and Cool-down Performed on first and last piece of equipment    Resistance Training Performed Yes    VAD Patient? No    PAD/SET Patient? No      Pain Assessment   Currently in Pain? No/denies                Social History   Tobacco Use  Smoking Status Every Day   Current packs/day: 0.50   Types: Cigarettes  Smokeless Tobacco Never  Tobacco Comments   Ready to quit: working on quit  taking meds   Counseling given: yes    Goals Met:  Independence with exercise equipment Exercise tolerated well No report of concerns or symptoms today  Goals Unmet:  Not Applicable  Comments: Pt able to follow exercise prescription today without complaint.  Will continue to monitor for progression.    Dr. Bethann Punches is Medical Director for [facility].  Dr. Vida Rigger is Medical Director for [facility].

## 2023-06-30 DIAGNOSIS — Z952 Presence of prosthetic heart valve: Secondary | ICD-10-CM

## 2023-06-30 NOTE — Progress Notes (Signed)
 Cardiac Individual Treatment Plan  Patient Details  Name: Jason Beck MRN: [id number] Date of Birth: November 01, 1955 Referring Provider:   Flowsheet Row Cardiac Rehab from 05/06/2023 in [facility] Cardiac and Pulmonary Rehab  Referring Provider Concha Pyo, FNP       Initial Encounter Date:  Flowsheet Row Cardiac Rehab from 05/06/2023 in [facility] Cardiac and Pulmonary Rehab  Date 05/06/23       Visit Diagnosis: S/P TAVR (transcatheter aortic valve replacement)  Patient's Home Medications on Admission:  Current Outpatient Medications:    albuterol (PROVENTIL) (2.5 MG/3ML) 0.083% nebulizer solution, , Disp: , Rfl:    albuterol (VENTOLIN HFA) 108 (90 Base) MCG/ACT inhaler, INHALE 2 PUFFS BY ORAL INHALATION EVERY 4 TO 6 HOURS AS NEEDED FOR CHRONIC OBSTRUCTIVE LUNG DISEASE FOR BREATHING. BE SURE TO WASH MOUTHPIECE WITH WARM WATER ONCE A WEEK, Disp: , Rfl:    amiodarone (PACERONE) 200 MG tablet, Take 1 tablet (200 mg total) by mouth daily., Disp: 90 tablet, Rfl: 1   apixaban (ELIQUIS) 5 MG TABS tablet, Take 1 tablet (5 mg total) by mouth 2 (two) times daily., Disp: 60 tablet, Rfl: 15   atorvastatin (LIPITOR) 80 MG tablet, Take 1 tablet (80 mg total) by mouth daily., Disp: 90 tablet, Rfl: 3   Buprenorphine HCl 900 MCG FILM, Place inside cheek., Disp: , Rfl:    buPROPion (WELLBUTRIN SR) 150 MG 12 hr tablet, Take 150 mg by mouth daily., Disp: , Rfl:    ciclesonide (ALVESCO) 160 MCG/ACT inhaler, Inhale 2 puffs into the lungs 2 (two) times daily., Disp: , Rfl:    clopidogrel (PLAVIX) 75 MG tablet, Take 1 tablet (75 mg total) by mouth daily. (Patient not taking: Reported on 04/29/2023), Disp: 30 tablet, Rfl: 1   docusate sodium (COLACE) 50 MG capsule, Take 50 mg by mouth daily as needed for mild constipation., Disp: , Rfl:    doxycycline (ADOXA) 100 MG tablet, Take 100 mg by mouth 2 (two) times daily. (Patient not taking: Reported on 04/29/2023), Disp: , Rfl:    empagliflozin (JARDIANCE) 10 MG TABS  tablet, Take 10 mg by mouth daily., Disp: , Rfl:    ezetimibe (ZETIA) 10 MG tablet, Take 1 tablet by mouth daily., Disp: , Rfl:    ferrous sulfate 325 (65 FE) MG tablet, Take 325 mg by mouth every other day., Disp: , Rfl:    furosemide (LASIX) 40 MG tablet, Take 1 tablet (40 mg total) by mouth daily., Disp: 30 tablet, Rfl: 11   gabapentin (NEURONTIN) 400 MG capsule, Take 800 mg by mouth 3 (three) times daily., Disp: , Rfl:    losartan (COZAAR) 25 MG tablet, Take 1 tablet (25 mg total) by mouth daily. (Patient taking differently: Take 12.5 mg by mouth daily.), Disp: 30 tablet, Rfl: 11   metFORMIN (GLUCOPHAGE) 500 MG tablet, Take 500 mg by mouth 2 (two) times daily with a meal., Disp: , Rfl:    methadone (DOLOPHINE) 10 MG tablet, Take 5 tablets by mouth 2 (two) times daily., Disp: , Rfl:    metoprolol succinate (TOPROL XL) 50 MG 24 hr tablet, Take 1 tablet (50 mg total) by mouth daily. Take with or immediately following a meal., Disp: 30 tablet, Rfl: 11   midodrine (PROAMATINE) 10 MG tablet, Take 1 tablet (10 mg total) by mouth 3 (three) times daily with meals. (Patient not taking: Reported on 04/29/2023), Disp: 90 tablet, Rfl: 1   naloxone (NARCAN) nasal spray 4 mg/0.1 mL, Place 0.4 mg into the nose once., Disp: ,  Rfl:    nitroGLYCERIN (NITROSTAT) 0.4 MG SL tablet, Place 1 tablet (0.4 mg total) under the tongue every 5 (five) minutes x 3 doses as needed for chest pain., Disp: 30 tablet, Rfl: 0   omeprazole (PRILOSEC) 20 MG capsule, Take 20 mg by mouth daily., Disp: , Rfl:    oxyCODONE (OXY IR/ROXICODONE) 5 MG immediate release tablet, Take 10 mg by mouth every 4 (four) hours as needed for severe pain. (Patient not taking: Reported on 04/29/2023), Disp: , Rfl:    oxycodone (OXY-IR) 5 MG capsule, Take 5 mg by mouth every 4 (four) hours as needed., Disp: , Rfl:    pantoprazole (PROTONIX) 40 MG tablet, TAKE 1 TABLET BY MOUTH EVERY DAY (Patient not taking: Reported on 04/29/2023), Disp: 30 tablet, Rfl: 1    potassium chloride SA (KLOR-CON M) 20 MEQ tablet, Take 1 tablet (20 mEq total) by mouth daily., Disp: 30 tablet, Rfl: 1   ranolazine (RANEXA) 500 MG 12 hr tablet, Take 1 tablet (500 mg total) by mouth 2 (two) times daily. (Patient not taking: Reported on 04/29/2023), Disp: 60 tablet, Rfl: 11   thiamine (VITAMIN B-1) 100 MG tablet, Take 1 tablet (100 mg total) by mouth daily., Disp: 30 tablet, Rfl: 3   ticagrelor (BRILINTA) 90 MG TABS tablet, Take 1 tablet by mouth 2 (two) times daily., Disp: , Rfl:    tiotropium (SPIRIVA) 18 MCG inhalation capsule, Place 1 capsule (18 mcg total) into inhaler and inhale daily., Disp: 30 capsule, Rfl: 1   Tiotropium Bromide-Olodaterol 2.5-2.5 MCG/ACT AERS, , Disp: , Rfl:    traZODone (DESYREL) 50 MG tablet, Take 25 mg by mouth at bedtime., Disp: , Rfl:   Past Medical History: Past Medical History:  Diagnosis Date   Asthma    Atrial fibrillation (HCC)    Bicuspid aortic valve    CHF (congestive heart failure) (HCC)    COPD (chronic obstructive pulmonary disease) (HCC)    Depression    Diabetes mellitus without complication (HCC)    Hypertension     Tobacco Use: Social History   Tobacco Use  Smoking Status Every Day   Current packs/day: 0.50   Types: Cigarettes  Smokeless Tobacco Never  Tobacco Comments   Ready to quit: working on quit  taking meds   Counseling given: yes    Labs: Review Flowsheet  More data may exist      Latest Ref Rng & Units 03/20/2022 03/21/2022 04/19/2022 05/02/2022 06/16/2022  Labs for ITP Cardiac and Pulmonary Rehab  Cholestrol 0 - 200 mg/dL 161  096  - - -  LDL (calc) 0 - 99 mg/dL 94  96  - - -  HDL-C >04 mg/dL 36  39  - - -  Trlycerides <150 mg/dL 540  82  - - -  Hemoglobin A1c 4.8 - 5.6 % - 5.8  6.4  - -  Bicarbonate 20.0 - 28.0 mmol/L - - - 19.9  26.4   Acid-base deficit 0.0 - 2.0 mmol/L - - - 3.3  -  O2 Saturation % - - - 78.8  95.1      Exercise Target Goals: Exercise Program Goal: Individual exercise  prescription set using results from initial 6 min walk test and THRR while considering  patient's activity barriers and safety.   Exercise Prescription Goal: Initial exercise prescription builds to 30-45 minutes a day of aerobic activity, 2-3 days per week.  Home exercise guidelines will be given to patient during program as part of exercise prescription that  the participant will acknowledge.   Education: Aerobic Exercise: - Group verbal and visual presentation on the components of exercise prescription. Introduces F.I.T.T principle from ACSM for exercise prescriptions.  Reviews F.I.T.T. principles of aerobic exercise including progression. Written material given at graduation.   Education: Resistance Exercise: - Group verbal and visual presentation on the components of exercise prescription. Introduces F.I.T.T principle from ACSM for exercise prescriptions  Reviews F.I.T.T. principles of resistance exercise including progression. Written material given at graduation.    Education: Exercise & Equipment Safety: - Individual verbal instruction and demonstration of equipment use and safety with use of the equipment. Flowsheet Row Cardiac Rehab from 06/08/2023 in [facility] Cardiac and Pulmonary Rehab  Date 05/06/23  Educator MB  Instruction Review Code 1- Verbalizes Understanding       Education: Exercise Physiology & General Exercise Guidelines: - Group verbal and written instruction with models to review the exercise physiology of the cardiovascular system and associated critical values. Provides general exercise guidelines with specific guidelines to those with heart or lung disease.    Education: Flexibility, Balance, Mind/Body Relaxation: - Group verbal and visual presentation with interactive activity on the components of exercise prescription. Introduces F.I.T.T principle from ACSM for exercise prescriptions. Reviews F.I.T.T. principles of flexibility and balance exercise training including  progression. Also discusses the mind body connection.  Reviews various relaxation techniques to help reduce and manage stress (i.e. Deep breathing, progressive muscle relaxation, and visualization). Balance handout provided to take home. Written material given at graduation.   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 HR 82 bpm     Max Ex. BP 140/72     2 Minute Post BP 134/72              Oxygen Initial Assessment:   Oxygen Re-Evaluation:   Oxygen Discharge (Final Oxygen Re-Evaluation):   Initial Exercise Prescription:  Initial Exercise Prescription - 05/06/23 1600       Date of Initial Exercise RX and Referring Provider   Date 05/06/23    Referring Provider Concha Pyo, FNP      Oxygen   Maintain Oxygen Saturation 88% or higher  Recumbant Bike   Level 1    RPM 50    Watts 15    Minutes 15    METs 1.91      NuStep   Level 1    SPM 80    Minutes 15    METs 1.91      Biostep-RELP   Level 1    SPM 50    Minutes 15    METs 1.91      Track   Laps 17     Minutes 15    METs 1.92      Prescription Details   Frequency (times per week) 2    Duration Progress to 30 minutes of continuous aerobic without signs/symptoms of physical distress      Intensity   THRR 40-80% of Max Heartrate 92-132    Ratings of Perceived Exertion 11-13    Perceived Dyspnea 0-4      Progression   Progression Continue to progress workloads to maintain intensity without signs/symptoms of physical distress.      Resistance Training   Training Prescription Yes    Weight 7 lb    Reps 10-15             Perform Capillary Blood Glucose checks as needed.  Exercise Prescription Changes:   Exercise Prescription Changes     Row Name 05/06/23 1600 06/01/23 1300 06/14/23 1700 06/29/23 1400       Response to Exercise   Blood Pressure (Admit) 130/80 136/72 122/62 126/68    Blood Pressure (Exercise) 140/72 176/80 -- 144/76    Blood Pressure (Exit) 134/72 118/62 118/62 110/60    Heart Rate (Admit) 53 bpm 79 bpm 64 bpm 62 bpm    Heart Rate (Exercise) 82 bpm 95 bpm 86 bpm 95 bpm    Heart Rate (Exit) 44 bpm 67 bpm 52 bpm 56 bpm    Oxygen Saturation (Admit) 97 % -- -- --    Oxygen Saturation (Exercise) 95 % -- -- --    Oxygen Saturation (Exit) 98 % -- -- --    Rating of Perceived Exertion (Exercise) 15 15 15 16     Perceived Dyspnea (Exercise) 0 -- -- --    Symptoms L leg and hip 8/10, R foot 8/10 none back pain on track none    Comments results First two weeks of exercise -- --    Duration Progress to 30 minutes of  aerobic without signs/symptoms of physical distress Progress to 30 minutes of  aerobic without signs/symptoms of physical distress Continue with 30 min of aerobic exercise without signs/symptoms of physical distress. Continue with 30 min of aerobic exercise without signs/symptoms of physical distress.    Intensity THRR New THRR unchanged THRR unchanged THRR unchanged      Progression   Progression Continue to progress workloads to maintain intensity  without signs/symptoms of physical distress. Continue to progress workloads to maintain intensity without signs/symptoms of physical distress. Continue to progress workloads to maintain intensity without signs/symptoms of physical distress. Continue to progress workloads to maintain intensity without signs/symptoms of physical distress.    Average METs 1.91 2.01 2.26 2.01      Resistance Training   Training Prescription -- Yes Yes Yes    Weight -- 7 lb 7 lb 7 lb    Reps -- 10-15 10-15 10-15      Interval Training   Interval Training -- No No No      Recumbant Bike   Level -- 1.5 1.2 --  Watts -- 15 15 --    Minutes -- 15 15 --    METs -- 2.66 2.63 --      NuStep   Level -- 1 2 2     Minutes -- 15 15 15     METs -- 2.1 2.4 2.7      Biostep-RELP   Level -- 1 1 3     Minutes -- 30 15 15     METs -- 2 3 2       Track   Laps -- 4 5 5     Minutes -- 15 15 15     METs -- 1.22 1.27 1.27      Oxygen   Maintain Oxygen Saturation -- 88% or higher 88% or higher 88% or higher             Exercise Comments:   Exercise Comments     Row Name 05/18/23 1124           Exercise Comments First full day of exercise!  Patient was oriented to gym and equipment including functions, settings, policies, and procedures.  Patient's individual exercise prescription and treatment plan were reviewed.  All starting workloads were established based on the results of the 6 minute walk test done at initial orientation visit.  The plan for exercise progression was also introduced and progression will be customized based on patient's performance and goals.                Exercise Goals and Review:   Exercise Goals     Row Name 05/06/23 1638             Exercise Goals   Increase Physical Activity Yes       Intervention Develop an individualized exercise prescription for aerobic and resistive training based on initial evaluation findings, risk stratification, comorbidities and participant's  personal goals.;Provide advice, education, support and counseling about physical activity/exercise needs.       Expected Outcomes Short Term: Attend rehab on a regular basis to increase amount of physical activity.;Long Term: Add in home exercise to make exercise part of routine and to increase amount of physical activity.;Long Term: Exercising regularly at least 3-5 days a week.       Increase Strength and Stamina Yes       Intervention Provide advice, education, support and counseling about physical activity/exercise needs.;Develop an individualized exercise prescription for aerobic and resistive training based on initial evaluation findings, risk stratification, comorbidities and participant's personal goals.       Expected Outcomes Short Term: Increase workloads from initial exercise prescription for resistance, speed, and METs.;Short Term: Perform resistance training exercises routinely during rehab and add in resistance training at home;Long Term: Improve cardiorespiratory fitness, muscular endurance and strength as measured by increased METs and functional capacity ( )       Able to understand and use rate of perceived exertion (RPE) scale Yes       Intervention Provide education and explanation on how to use RPE scale       Expected Outcomes Short Term: Able to use RPE daily in rehab to express subjective intensity level;Long Term:  Able to use RPE to guide intensity level when exercising independently       Able to understand and use Dyspnea scale Yes       Intervention Provide education and explanation on how to use Dyspnea scale       Expected Outcomes Short Term: Able to use Dyspnea scale daily in rehab to express subjective  sense of shortness of breath during exertion;Long Term: Able to use Dyspnea scale to guide intensity level when exercising independently       Knowledge and understanding of Target Heart Rate Range (THRR) Yes       Intervention Provide education and explanation of THRR  including how the numbers were predicted and where they are located for reference       Expected Outcomes Short Term: Able to state/look up THRR;Short Term: Able to use daily as guideline for intensity in rehab;Long Term: Able to use THRR to govern intensity when exercising independently       Able to check pulse independently Yes       Intervention Provide education and demonstration on how to check pulse in carotid and radial arteries.;Review the importance of being able to check your own pulse for safety during independent exercise       Expected Outcomes Short Term: Able to explain why pulse checking is important during independent exercise;Long Term: Able to check pulse independently and accurately       Understanding of Exercise Prescription Yes       Intervention Provide education, explanation, and written materials on patient's individual exercise prescription       Expected Outcomes Short Term: Able to explain program exercise prescription;Long Term: Able to explain home exercise prescription to exercise independently                Exercise Goals Re-Evaluation :  Exercise Goals Re-Evaluation     Row Name 05/18/23 1124 06/01/23 1402 06/14/23 1712 06/29/23 1448       Exercise Goal Re-Evaluation   Exercise Goals Review Understanding of Exercise Prescription;Knowledge and understanding of Target Heart Rate Range (THRR);Able to understand and use rate of perceived exertion (RPE) scale;Able to understand and use Dyspnea scale Increase Physical Activity;Increase Strength and Stamina;Understanding of Exercise Prescription Increase Physical Activity;Increase Strength and Stamina;Understanding of Exercise Prescription Increase Physical Activity;Increase Strength and Stamina;Understanding of Exercise Prescription    Comments Reviewed RPE and dyspnea scale, THR and program prescription with pt today.  Pt voiced understanding and was given a copy of goals to take home. Amore is off to a good  start in the program. He has done well at level 1 on the T4 nustep and biostep, and level 1.5 on the recumbent bike. He also attempted to walk the track and was able to reach 4 laps. We will continue to monitor his progress in the program. Antrone is doing well in rehab. He went up to level 2 on the T4 nustep and was able to do 5 laps on the track prior to back pain. We will continue to monitor his progress in the program. Press continues to do well in rehab. He has continued to walk the track and has walked 5 laps on the track prior to back pain. He also improved to level 3 on the biostep and continues to work at level 2 on the T4 nustep. We will continue to monitor his progress in the program.    Expected Outcomes Short: Use RPE daily to regulate intensity. Long: Follow program prescription in THR. Short: Continue to follow current exercise prescription. Long: Continue exercise to improve strength and stamina. Short: Continue to push for more laps on the track as back pain tolerates. Long: Continue exercise to improve strength and stamina. Short: Continue to push for more laps on the track as tolerated by back pain. Long: Continue exercise to improve strength and stamina.  Discharge Exercise Prescription (Final Exercise Prescription Changes):  Exercise Prescription Changes - 06/29/23 1400       Response to Exercise   Blood Pressure (Admit) 126/68    Blood Pressure (Exercise) 144/76    Blood Pressure (Exit) 110/60    Heart Rate (Admit) 62 bpm    Heart Rate (Exercise) 95 bpm    Heart Rate (Exit) 56 bpm    Rating of Perceived Exertion (Exercise) 16    Symptoms none    Duration Continue with 30 min of aerobic exercise without signs/symptoms of physical distress.    Intensity THRR unchanged      Progression   Progression Continue to progress workloads to maintain intensity without signs/symptoms of physical distress.    Average METs 2.01      Resistance Training   Training  Prescription Yes    Weight 7 lb    Reps 10-15      Interval Training   Interval Training No      NuStep   Level 2    Minutes 15    METs 2.7      Biostep-RELP   Level 3    Minutes 15    METs 2      Track   Laps 5    Minutes 15    METs 1.27      Oxygen   Maintain Oxygen Saturation 88% or higher             Nutrition:  Target Goals: Understanding of nutrition guidelines, daily intake of sodium 1500mg , cholesterol 200mg , calories 30% from fat and 7% or less from saturated fats, daily to have 5 or more servings of fruits and vegetables.  Education: All About Nutrition: -Group instruction provided by verbal, written material, interactive activities, discussions, models, and posters to present general guidelines for heart healthy nutrition including fat, fiber, MyPlate, the role of sodium in heart healthy nutrition, utilization of the nutrition label, and utilization of this knowledge for meal planning. Follow up email sent as well. Written material given at graduation.   Biometrics:  Pre Biometrics - 05/06/23 1639       Pre Biometrics   Height 5' 9.8" (1.773 m)    Weight 161 lb 11.2 oz (73.3 kg)    Waist Circumference 42 inches    Hip Circumference 39 inches    Waist to Hip Ratio 1.08 %    BMI (Calculated) 23.33    Single Leg Stand 12.8 seconds              Nutrition Therapy Plan and Nutrition Goals:  Nutrition Therapy & Goals - 06/01/23 1314       Personal Nutrition Goals   Nutrition Goal Meet with dietician 2/20             Nutrition Assessments:  MEDIFICTS Score Key: >=70 Need to make dietary changes  40-70 Heart Healthy Diet <= 40 Therapeutic Level Cholesterol Diet  Flowsheet Row Cardiac Rehab from 05/18/2023 in [facility] Cardiac and Pulmonary Rehab  Picture Your Plate Total Score on Admission 55  Picture Your Plate Total Score on Discharge 55      Picture Your Plate Scores: <56 Unhealthy dietary pattern with much room for  improvement. 41-50 Dietary pattern unlikely to meet recommendations for good health and room for improvement. 51-60 More healthful dietary pattern, with some room for improvement.  >60 Healthy dietary pattern, although there may be some specific behaviors that could be improved.    Nutrition Goals Re-Evaluation:  Nutrition Goals  Re-Evaluation     Row Name 06/29/23 1147             Goals   Comment Patient appetite is down, he doesnt report loss of taste or loss of enjoyment of food, but rather not feeling hungry. Reviewed the importance of calorie dense foods like peanut butter to help him meet nutrition needs without feeling the needs to eat as much food.       Expected Outcome STG: focus on calorie dense foods and meeting nutrition needs. LTG: follow a heart healthy diet                Nutrition Goals Discharge (Final Nutrition Goals Re-Evaluation):  Nutrition Goals Re-Evaluation - 06/29/23 1147       Goals   Comment Patient appetite is down, he doesnt report loss of taste or loss of enjoyment of food, but rather not feeling hungry. Reviewed the importance of calorie dense foods like peanut butter to help him meet nutrition needs without feeling the needs to eat as much food.    Expected Outcome STG: focus on calorie dense foods and meeting nutrition needs. LTG: follow a heart healthy diet             Psychosocial: Target Goals: Acknowledge presence or absence of significant depression and/or stress, maximize coping skills, provide positive support system. Participant is able to verbalize types and ability to use techniques and skills needed for reducing stress and depression.   Education: Stress, Anxiety, and Depression - Group verbal and visual presentation to define topics covered.  Reviews how body is impacted by stress, anxiety, and depression.  Also discusses healthy ways to reduce stress and to treat/manage anxiety and depression.  Written material given at  graduation.   Education: Sleep Hygiene -Provides group verbal and written instruction about how sleep can affect your health.  Define sleep hygiene, discuss sleep cycles and impact of sleep habits. Review good sleep hygiene tips.    Initial Review & Psychosocial Screening:  Initial Psych Review & Screening - 04/29/23 1229       Family Dynamics   Good Support System? No    Comments no support   all friends and family have died in past few years             Quality of Life Scores:   Scores of 19 and below usually indicate a poorer quality of life in these areas.  A difference of  2-3 points is a clinically meaningful difference.  A difference of 2-3 points in the total score of the Quality of Life Index has been associated with significant improvement in overall quality of life, self-image, physical symptoms, and general health in studies assessing change in quality of life.  PHQ-9: Review Flowsheet       06/29/2023 06/01/2023 05/06/2023  Depression screen PHQ 2/9  Decreased Interest 2 2 2   Down, Depressed, Hopeless 1 1 1   PHQ - 2 Score 3 3 3   Altered sleeping 2 2 3   Tired, decreased energy 2 2 3   Change in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

## 2023-07-01 ENCOUNTER — Encounter: Payer: No Typology Code available for payment source | Admitting: *Deleted

## 2023-07-01 DIAGNOSIS — Z48812 Encounter for surgical aftercare following surgery on the circulatory system: Secondary | ICD-10-CM | POA: Diagnosis not present

## 2023-07-01 DIAGNOSIS — Z952 Presence of prosthetic heart valve: Secondary | ICD-10-CM

## 2023-07-01 DIAGNOSIS — R0602 Shortness of breath: Secondary | ICD-10-CM

## 2023-07-01 NOTE — Progress Notes (Signed)
 Daily Session Note  Patient Details  Name: Jason Beck MRN: [id number] Date of Birth: 1955/04/29 Referring Provider:   Flowsheet Row Cardiac Rehab from 05/06/2023 in [facility] Cardiac and Pulmonary Rehab  Referring Provider Concha Pyo, FNP       Encounter Date: 07/01/2023  Check In:  Session Check In - 07/01/23 1112       Check-In   Supervising physician immediately available to respond to emergencies See telemetry face sheet for immediately available ER MD    Location [facility]-Cardiac & Pulmonary Rehab    Staff Present Cora Collum, RN, BSN, CCRP;Margaret Best, MS, Exercise Physiologist;Maxon Conetta BS, Exercise Physiologist;Joseph Reino Kent RCP,RRT,BSRT;Meredith Craven RN,BSN    Virtual Visit No    Medication changes reported     No    Fall or balance concerns reported    No    Warm-up and Cool-down Performed on first and last piece of equipment    Resistance Training Performed Yes    VAD Patient? No    PAD/SET Patient? No      Pain Assessment   Currently in Pain? No/denies                Social History   Tobacco Use  Smoking Status Every Day   Current packs/day: 0.50   Types: Cigarettes  Smokeless Tobacco Never  Tobacco Comments   Ready to quit: working on quit  taking meds   Counseling given: yes    Goals Met:  Independence with exercise equipment Exercise tolerated well No report of concerns or symptoms today  Goals Unmet:  Not Applicable  Comments: Pt able to follow exercise prescription today without complaint.  Will continue to monitor for progression.    Dr. Bethann Punches is Medical Director for [facility] Cardiac Rehabilitation.  Dr. Vida Rigger is Medical Director for [facility] Pulmonary Rehabilitation.

## 2023-07-06 ENCOUNTER — Encounter: Payer: No Typology Code available for payment source | Admitting: *Deleted

## 2023-07-06 DIAGNOSIS — Z48812 Encounter for surgical aftercare following surgery on the circulatory system: Secondary | ICD-10-CM | POA: Diagnosis not present

## 2023-07-06 DIAGNOSIS — Z952 Presence of prosthetic heart valve: Secondary | ICD-10-CM

## 2023-07-06 NOTE — Progress Notes (Signed)
 Daily Session Note  Patient Details  Name: Jason Beck MRN: [id number] Date of Birth: Oct 29, 1955 Referring Provider:   Flowsheet Row Cardiac Rehab from 05/06/2023 in [facility] Cardiac and Pulmonary Rehab  Referring Provider Concha Pyo, FNP       Encounter Date: 07/06/2023  Check In:  Session Check In - 07/06/23 1155       Check-In   Supervising physician immediately available to respond to emergencies See telemetry face sheet for immediately available ER MD    Location [facility]-Cardiac & Pulmonary Rehab    Staff Present Rory Percy, MS, Exercise Physiologist;Shayon Trompeter, RN, BSN, CCRP;Noah Tickle, BS, Exercise Physiologist;Meredith Jewel Baize RN,BSN    Virtual Visit No    Medication changes reported     No    Fall or balance concerns reported    No    Warm-up and Cool-down Performed on first and last piece of equipment    Resistance Training Performed Yes    VAD Patient? No    PAD/SET Patient? No      Pain Assessment   Currently in Pain? No/denies                Social History   Tobacco Use  Smoking Status Every Day   Current packs/day: 0.50   Types: Cigarettes  Smokeless Tobacco Never  Tobacco Comments   Ready to quit: working on quit  taking meds   Counseling given: yes    Goals Met:  Independence with exercise equipment Exercise tolerated well No report of concerns or symptoms today  Goals Unmet:  Not Applicable  Comments: Pt able to follow exercise prescription today without complaint.  Will continue to monitor for progression.    Dr. Bethann Punches is Medical Director for [facility] Cardiac Rehabilitation.  Dr. Vida Rigger is Medical Director for [facility] Pulmonary Rehabilitation.

## 2023-07-08 ENCOUNTER — Encounter: Payer: No Typology Code available for payment source | Admitting: *Deleted

## 2023-07-08 DIAGNOSIS — R0602 Shortness of breath: Secondary | ICD-10-CM

## 2023-07-08 DIAGNOSIS — Z48812 Encounter for surgical aftercare following surgery on the circulatory system: Secondary | ICD-10-CM | POA: Diagnosis not present

## 2023-07-08 DIAGNOSIS — Z952 Presence of prosthetic heart valve: Secondary | ICD-10-CM

## 2023-07-08 NOTE — Progress Notes (Signed)
 Daily Session Note  Patient Details  Name: BRET VANESSEN MRN: [id number] Date of Birth: [birth year] Referring Provider:   Flowsheet Row Cardiac Rehab from 05/06/2023 in [facility] Cardiac and Pulmonary Rehab  Referring Provider Concha Pyo, FNP       Encounter Date: 07/08/2023  Check In:  Session Check In - 07/08/23 1120       Check-In   Supervising physician immediately available to respond to emergencies See telemetry face sheet for immediately available ER MD    Location [facility]-Cardiac & Pulmonary Rehab    Staff Present Cora Collum, RN, BSN, CCRP;Margaret Best, MS, Exercise Physiologist;Meredith Jewel Baize RN,BSN;Joseph Hood RCP,RRT,BSRT;Jason Wallace Cullens RDN,LDN    Virtual Visit No    Medication changes reported     No    Fall or balance concerns reported    No    Warm-up and Cool-down Performed on first and last piece of equipment    Resistance Training Performed Yes    VAD Patient? No    PAD/SET Patient? No      Pain Assessment   Currently in Pain? No/denies             stopped Bulbutrex and statred Subutex    Social History   Tobacco Use  Smoking Status Every Day   Current packs/day: 0.50   Types: Cigarettes  Smokeless Tobacco Never  Tobacco Comments   Ready to quit: working on quit  taking meds   Counseling given: yes    Goals Met:  Independence with exercise equipment Exercise tolerated well No report of concerns or symptoms today  Goals Unmet:  Not Applicable  Comments: Pt able to follow exercise prescription today without complaint.  Will continue to monitor for progression.    Dr. Bethann Punches is Medical Director for [facility] Cardiac Rehabilitation.  Dr. Vida Rigger is Medical Director for [facility] Pulmonary Rehabilitation.

## 2023-07-13 ENCOUNTER — Encounter: Payer: No Typology Code available for payment source | Admitting: *Deleted

## 2023-07-13 DIAGNOSIS — Z48812 Encounter for surgical aftercare following surgery on the circulatory system: Secondary | ICD-10-CM | POA: Diagnosis not present

## 2023-07-13 DIAGNOSIS — R0602 Shortness of breath: Secondary | ICD-10-CM

## 2023-07-13 DIAGNOSIS — Z952 Presence of prosthetic heart valve: Secondary | ICD-10-CM

## 2023-07-13 NOTE — Progress Notes (Signed)
 Daily Session Note  Patient Details  Name: ZYRON DEELEY MRN: [id number] Date of Birth: 1955-09-24 Referring Provider:   Flowsheet Row Cardiac Rehab from 05/06/2023 in [facility] Cardiac and Pulmonary Rehab  Referring Provider Concha Pyo, FNP       Encounter Date: 07/13/2023  Check In:  Session Check In - 07/13/23 1125       Check-In   Supervising physician immediately available to respond to emergencies See telemetry face sheet for immediately available ER MD    Location [facility]-Cardiac & Pulmonary Rehab    Staff Present Rory Percy, MS, Exercise Physiologist;Maxon Conetta BS, Exercise Physiologist;Noah Tickle, BS, Exercise Physiologist;Jason Wallace Cullens RDN,LDN;Otie Headlee, RN, BSN, CCRP;Meredith Jewel Baize RN,BSN    Virtual Visit No    Medication changes reported     No    Fall or balance concerns reported    No    Warm-up and Cool-down Performed on first and last piece of equipment    Resistance Training Performed Yes    VAD Patient? No    PAD/SET Patient? No      Pain Assessment   Currently in Pain? No/denies                Social History   Tobacco Use  Smoking Status Every Day   Current packs/day: 0.50   Types: Cigarettes  Smokeless Tobacco Never  Tobacco Comments   Ready to quit: working on quit  taking meds   Counseling given: yes    Goals Met:  Independence with exercise equipment Exercise tolerated well No report of concerns or symptoms today  Goals Unmet:  Not Applicable  Comments: Pt able to follow exercise prescription today without complaint.  Will continue to monitor for progression.    Dr. Bethann Punches is Medical Director for [facility] Cardiac Rehabilitation.  Dr. Vida Rigger is Medical Director for [facility] Pulmonary Rehabilitation.

## 2023-07-15 ENCOUNTER — Encounter: Payer: No Typology Code available for payment source | Admitting: *Deleted

## 2023-07-15 DIAGNOSIS — Z48812 Encounter for surgical aftercare following surgery on the circulatory system: Secondary | ICD-10-CM | POA: Diagnosis not present

## 2023-07-15 DIAGNOSIS — Z952 Presence of prosthetic heart valve: Secondary | ICD-10-CM

## 2023-07-15 DIAGNOSIS — R0602 Shortness of breath: Secondary | ICD-10-CM

## 2023-07-15 NOTE — Progress Notes (Signed)
 Daily Session Note  Patient Details  Name: MARIAH HARN MRN: [id number] Date of Birth: 08-20-1955 Referring Provider:   Flowsheet Row Cardiac Rehab from 05/06/2023 in [facility] Cardiac and Pulmonary Rehab  Referring Provider Concha Pyo, FNP       Encounter Date: 07/15/2023  Check In:  Session Check In - 07/15/23 1137       Check-In   Supervising physician immediately available to respond to emergencies See telemetry face sheet for immediately available ER MD    Location [facility]-Cardiac & Pulmonary Rehab    Staff Present Rory Percy, MS, Exercise Physiologist;Jsaon Yoo, RN, BSN, CCRP;Maxon Conetta BS, Exercise Physiologist;Joseph Hood RCP,RRT,BSRT;Meredith Craven RN,BSN    Virtual Visit No    Medication changes reported     No    Fall or balance concerns reported    No    Warm-up and Cool-down Performed on first and last piece of equipment    Resistance Training Performed Yes    VAD Patient? No    PAD/SET Patient? No      Pain Assessment   Currently in Pain? No/denies                Social History   Tobacco Use  Smoking Status Every Day   Current packs/day: 0.50   Types: Cigarettes  Smokeless Tobacco Never  Tobacco Comments   Ready to quit: working on quit  taking meds   Counseling given: yes    Goals Met:  Independence with exercise equipment Exercise tolerated well No report of concerns or symptoms today  Goals Unmet:  Not Applicable  Comments: Pt able to follow exercise prescription today without complaint.  Will continue to monitor for progression.    Dr. Bethann Punches is Medical Director for [facility].  Dr. Vida Rigger is Medical Director for [facility] Pul[facility] Rehabilitation.

## 2023-07-20 ENCOUNTER — Encounter: Payer: No Typology Code available for payment source | Attending: Nurse Practitioner | Admitting: *Deleted

## 2023-07-20 DIAGNOSIS — Z952 Presence of prosthetic heart valve: Secondary | ICD-10-CM | POA: Diagnosis not present

## 2023-07-20 DIAGNOSIS — R0602 Shortness of breath: Secondary | ICD-10-CM | POA: Diagnosis present

## 2023-07-20 NOTE — Progress Notes (Signed)
 Daily Session Note  Patient Details  Name: Jason Beck MRN: [id number] Date of Birth: [birth year] Referring Provider:   Flowsheet Row Cardiac Rehab from 05/06/2023 in [facility] Cardiac and Pulmonary Rehab  Referring Provider Concha Pyo, FNP       Encounter Date: 07/20/2023  Check In:  Session Check In - 07/20/23 1116       Check-In   Supervising physician immediately available to respond to emergencies See telemetry face sheet for immediately available ER MD    Location [facility]-Cardiac & Pulmonary Re[facility]    Staff Present Rory Percy, MS, Exercise Physiologist;Maxon Conetta BS, Exercise Physiologist;Noah Tickle, BS, Exercise Physiologist;Meredith Jewel Baize RN,BSN;Jamarien Rodkey, RN, BSN, CCRP    Virtual Visit No    Medication changes reported     No    Fall or balance concerns reported    No    Warm-up and Cool-down Performed on first and last piece of equipment    Resistance Training Performed Yes    VAD Patient? No    PAD/SET Patient? No      Pain Assessment   Currently in Pain? No/denies                Social History   Tobacco Use  Smoking Status Every Day   Current packs/day: 0.50   Types: Cigarettes  Smokeless Tobacco Never  Tobacco Comments   Ready to quit: working on quit  taking meds   Counseling given: yes    Goals Met:  Independence with exercise equipment Exercise tolerated well No report of concerns or symptoms today  Goals Unmet:  Not Applicable  Comments: Pt able to follow exercise prescription today without complaint.  Will continue to monitor for progression.    Dr. Bethann Punches is Medical Director for [facility] Cardiac Rehabilitation.  Dr. Vida Rigger is Medical Director for [facility] Pulmonary Rehabilitation.

## 2023-07-22 ENCOUNTER — Encounter: Payer: No Typology Code available for payment source | Admitting: *Deleted

## 2023-07-22 DIAGNOSIS — R0602 Shortness of breath: Secondary | ICD-10-CM | POA: Diagnosis not present

## 2023-07-22 DIAGNOSIS — Z952 Presence of prosthetic heart valve: Secondary | ICD-10-CM

## 2023-07-22 NOTE — Progress Notes (Signed)
 Daily Session Note  Patient Details  Name: Jason Beck MRN: [id number] Date of Birth: 1956-02-28 Referring Provider:   Flowsheet Row Cardiac Rehab from 05/06/2023 in [facility] Cardiac and Pulmonary Rehab  Referring Provider Concha Pyo, FNP       Encounter Date: 07/22/2023  Check In:  Session Check In - 07/22/23 1121       Check-In   Supervising physician immediately available to respond to emergencies See telemetry face sheet for immediately available ER MD    Location [facility]-Cardiac & Pulmonary Rehab    Staff Present Rory Percy, MS, Exercise Physiologist;Amen Dargis, RN, BSN, CCRP;Joseph Hood RCP,RRT,BSRT;Jason Wallace Cullens RDN,LDN;Meredith Jewel Baize RN,BSN    Virtual Visit No    Medication changes reported     No    Fall or balance concerns reported    No    Warm-up and Cool-down Performed on first and last piece of equipment    Resistance Training Performed Yes    VAD Patient? No    PAD/SET Patient? No      Pain Assessment   Currently in Pain? No/denies                Social History   Tobacco Use  Smoking Status Every Day   Current packs/day: 0.50   Types: Cigarettes  Smokeless Tobacco Never  Tobacco Comments   Ready to quit: working on quit  taking meds   Counseling given: yes    Goals Met:  Independence with exercise equipment Exercise tolerated well No report of concerns or symptoms today  Goals Unmet:  Not Applicable  Comments: Pt able to follow exercise prescription today without complaint.  Will continue to monitor for progression.    Dr. Bethann Punches is Medical Director for [facility] Card[facility] Rehabilitation.  Dr. Vida Rigger is Medical Director for [facility] Pulmonary Rehabilitation.

## 2023-07-27 ENCOUNTER — Encounter: Payer: No Typology Code available for payment source | Admitting: *Deleted

## 2023-07-27 DIAGNOSIS — Z952 Presence of prosthetic heart valve: Secondary | ICD-10-CM

## 2023-07-27 DIAGNOSIS — R0602 Shortness of breath: Secondary | ICD-10-CM

## 2023-07-27 NOTE — Progress Notes (Signed)
 Daily Session Note  Patient Details  Name: Jason Beck MRN: [id number] Date of Birth: [birth year] Referring Provider:   Flowsheet Row Cardiac Rehab from 05/06/2023 in [facility] Cardiac and Pulmonary Rehab  Referring Provider Concha Pyo, FNP       Encounter Date: 07/27/2023  Check In:  Session Check In - 07/27/23 1106       Check-In   Supervising physician immediately available to respond to emergencies See telemetry face sheet for immediately available ER MD    Location [facility]-Cardiac & Pulmonary Rehab    Staff Present Rory Percy, MS, Exercise Physiologist;Maxon Suzzette Righter, Exercise Physiologist;Meredith Jewel Baize RN,BSN;Kazuma Elena, RN, BSN, CCRP    Virtual Visit No    Medication changes reported     No    Fall or balance concerns reported    No    Warm-up and Cool-down Performed on first and last piece of equipment    Resistance Training Performed Yes    VAD Patient? No    PAD/SET Patient? No      Pain Assessment   Currently in Pain? No/denies                Social History   Tobacco Use  Smoking Status Every Day   Current packs/day: 0.50   Types: Cigarettes  Smokeless Tobacco Never  Tobacco Comments   Ready to quit: working on quit  taking meds   Counseling given: yes    Goals Met:  Independence with exercise equipment Exercise tolerated well No report of concerns or symptoms today  Goals Unmet:  Not Applicable  Comments: Pt able to follow exercise prescription today without complaint.  Will continue to monitor for progression.    Dr. Bethann Punches is Medical Director for [facility] Cardiac Rehabilitation.  Dr. Vida Rigger is Medical Director for [facility] Pulmonary Rehabilitation.

## 2023-07-28 ENCOUNTER — Encounter: Payer: Self-pay | Admitting: *Deleted

## 2023-07-28 DIAGNOSIS — Z952 Presence of prosthetic heart valve: Secondary | ICD-10-CM

## 2023-07-28 DIAGNOSIS — R0602 Shortness of breath: Secondary | ICD-10-CM

## 2023-07-28 NOTE — Progress Notes (Signed)
 Cardiac Individual Treatment Plan  Patient Details  Name: Jason Beck MRN: [id number] Date of Birth: 06-05-1955 Referring Provider:   Flowsheet Row Cardiac Rehab from 05/06/2023 in [facility] Cardiac and Pulmonary Rehab  Referring Provider Concha Pyo, FNP       Initial Encounter Date:  Flowsheet Row Cardiac Rehab from 05/06/2023 in [facility] Cardiac and Pulmonary Rehab  Date 05/06/23       Visit Diagnosis: S/P TAVR (transcatheter aortic valve replacement)  Shortness of breath  Patient's Home Medications on Admission:  Current Outpatient Medications:    albuterol (PROVENTIL) (2.5 MG/3ML) 0.083% nebulizer solution, , Disp: , Rfl:    albuterol (VENTOLIN HFA) 108 (90 Base) MCG/ACT inhaler, INHALE 2 PUFFS BY ORAL INHALATION EVERY 4 TO 6 HOURS AS NEEDED FOR CHRONIC OBSTRUCTIVE LUNG DISEASE FOR BREATHING. BE SURE TO WASH MOUTHPIECE WITH WARM WATER ONCE A WEEK, Disp: , Rfl:    amiodarone (PACERONE) 200 MG tablet, Take 1 tablet (200 mg total) by mouth daily., Disp: 90 tablet, Rfl: 1   apixaban (ELIQUIS) 5 MG TABS tablet, Take 1 tablet (5 mg total) by mouth 2 (two) times daily., Disp: 60 tablet, Rfl: 15   atorvastatin (LIPITOR) 80 MG tablet, Take 1 tablet (80 mg total) by mouth daily., Disp: 90 tablet, Rfl: 3   buprenorphine (SUBUTEX) 2 MG SUBL SL tablet, Place 2 mg under the tongue every 12 (twelve) hours., Disp: , Rfl:    buPROPion (WELLBUTRIN SR) 150 MG 12 hr tablet, Take 150 mg by mouth daily., Disp: , Rfl:    ciclesonide (ALVESCO) 160 MCG/ACT inhaler, Inhale 2 puffs into the lungs 2 (two) times daily., Disp: , Rfl:    clopidogrel (PLAVIX) 75 MG tablet, Take 1 tablet (75 mg total) by mouth daily. (Patient not taking: Reported on 04/29/2023), Disp: 30 tablet, Rfl: 1   docusate sodium (COLACE) 50 MG capsule, Take 50 mg by mouth daily as needed for mild constipation., Disp: , Rfl:    doxycycline (ADOXA) 100 MG tablet, Take 100 mg by mouth 2 (two) times daily. (Patient not taking: Reported on  04/29/2023), Disp: , Rfl:    empagliflozin (JARDIANCE) 10 MG TABS tablet, Take 10 mg by mouth daily., Disp: , Rfl:    ezetimibe (ZETIA) 10 MG tablet, Take 1 tablet by mouth daily., Disp: , Rfl:    ferrous sulfate 325 (65 FE) MG tablet, Take 325 mg by mouth every other day., Disp: , Rfl:    furosemide (LASIX) 40 MG tablet, Take 1 tablet (40 mg total) by mouth daily., Disp: 30 tablet, Rfl: 11   gabapentin (NEURONTIN) 400 MG capsule, Take 800 mg by mouth 3 (three) times daily., Disp: , Rfl:    losartan (COZAAR) 25 MG tablet, Take 1 tablet (25 mg total) by mouth daily. (Patient taking differently: Take 12.5 mg by mouth daily.), Disp: 30 tablet, Rfl: 11   metFORMIN (GLUCOPHAGE) 500 MG tablet, Take 500 mg by mouth 2 (two) times daily with a meal., Disp: , Rfl:    methadone (DOLOPHINE) 10 MG tablet, Take 5 tablets by mouth 2 (two) times daily., Disp: , Rfl:    metoprolol succinate (TOPROL XL) 50 MG 24 hr tablet, Take 1 tablet (50 mg total) by mouth daily. Take with or immediately following a meal., Disp: 30 tablet, Rfl: 11   midodrine (PROAMATINE) 10 MG tablet, Take 1 tablet (10 mg total) by mouth 3 (three) times daily with meals. (Patient not taking: Reported on 04/29/2023), Disp: 90 tablet, Rfl: 1   naloxone (NARCAN) nasal  spray 4 mg/0.1 mL, Place 0.4 mg into the nose once., Disp: , Rfl:    nitroGLYCERIN (NITROSTAT) 0.4 MG SL tablet, Place 1 tablet (0.4 mg total) under the tongue every 5 (five) minutes x 3 doses as needed for chest pain., Disp: 30 tablet, Rfl: 0   omeprazole (PRILOSEC) 20 MG capsule, Take 20 mg by mouth daily., Disp: , Rfl:    oxyCODONE (OXY IR/ROXICODONE) 5 MG immediate release tablet, Take 10 mg by mouth every 4 (four) hours as needed for severe pain. (Patient not taking: Reported on 04/29/2023), Disp: , Rfl:    oxycodone (OXY-IR) 5 MG capsule, Take 5 mg by mouth every 4 (four) hours as needed., Disp: , Rfl:    pantoprazole (PROTONIX) 40 MG tablet, TAKE 1 TABLET BY MOUTH EVERY DAY (Patient  not taking: Reported on 04/29/2023), Disp: 30 tablet, Rfl: 1   potassium chloride SA (KLOR-CON M) 20 MEQ tablet, Take 1 tablet (20 mEq total) by mouth daily., Disp: 30 tablet, Rfl: 1   ranolazine (RANEXA) 500 MG 12 hr tablet, Take 1 tablet (500 mg total) by mouth 2 (two) times daily. (Patient not taking: Reported on 04/29/2023), Disp: 60 tablet, Rfl: 11   thiamine (VITAMIN B-1) 100 MG tablet, Take 1 tablet (100 mg total) by mouth daily., Disp: 30 tablet, Rfl: 3   ticagrelor (BRILINTA) 90 MG TABS tablet, Take 1 tablet by mouth 2 (two) times daily., Disp: , Rfl:    tiotropium (SPIRIVA) 18 MCG inhalation capsule, Place 1 capsule (18 mcg total) into inhaler and inhale daily., Disp: 30 capsule, Rfl: 1   Tiotropium Bromide-Olodaterol 2.5-2.5 MCG/ACT AERS, , Disp: , Rfl:    traZODone (DESYREL) 50 MG tablet, Take 25 mg by mouth at bedtime., Disp: , Rfl:   Past Medical History: Past Medical History:  Diagnosis Date   Asthma    Atrial fibrillation (HCC)    Bicuspid aortic valve    CHF (congestive heart failure) (HCC)    COPD (chronic obstructive pulmonary disease) (HCC)    Depression    Diabetes mellitus without complication (HCC)    Hypertension     Tobacco Use: Social History   Tobacco Use  Smoking Status Every Day   Current packs/day: 0.50   Types: Cigarettes  Smokeless Tobacco Never  Tobacco Comments   Ready to quit: working on quit  taking meds   Counseling given: yes    Labs: Review Flowsheet  More data may exist      Latest Ref Rng & Units 03/20/2022 03/21/2022 04/19/2022 05/02/2022 06/16/2022  Labs for ITP Cardiac and Pulmonary Rehab  Cholestrol 0 - 200 mg/dL 782  956  - - -  LDL (calc) 0 - 99 mg/dL 94  96  - - -  HDL-C >21 mg/dL 36  39  - - -  Trlycerides <150 mg/dL 308  82  - - -  Hemoglobin A1c 4.8 - 5.6 % - 5.8  6.4  - -  Bicarbonate 20.0 - 28.0 mmol/L - - - 19.9  26.4   Acid-base deficit 0.0 - 2.0 mmol/L - - - 3.3  -  O2 Saturation % - - - 78.8  95.1      Exercise  Target Goals: Exercise Program Goal: Individual exercise prescription set using results from initial 6 min walk test and THRR while considering  patient's activity barriers and safety.   Exercise Prescription Goal: Initial exercise prescription builds to 30-45 minutes a day of aerobic activity, 2-3 days per week.  Home exercise guidelines  will be given to patient during program as part of exercise prescription that the participant will acknowledge.   Education: Aerobic Exercise: - Group verbal and visual presentation on the components of exercise prescription. Introduces F.I.T.T principle from ACSM for exercise prescriptions.  Reviews F.I.T.T. principles of aerobic exercise including progression. Written material given at graduation.   Education: Resistance Exercise: - Group verbal and visual presentation on the components of exercise prescription. Introduces F.I.T.T principle from ACSM for exercise prescriptions  Reviews F.I.T.T. principles of resistance exercise including progression. Written material given at graduation.    Education: Exercise & Equipment Safety: - Individual verbal instruction and demonstration of equipment use and safety with use of the equipment. Flowsheet Row Cardiac Rehab from 06/08/2023 in [facility] Cardiac and Pulmonary Rehab  Date 05/06/23  Educator MB  Instruction Review Code 1- Verbalizes Understanding       Education: Exercise Physiology & General Exercise Guidelines: - Group verbal and written instruction with models to review the exercise physiology of the cardiovascular system and associated critical values. Provides general exercise guidelines with specific guidelines to those with heart or lung disease.    Education: Flexibility, Balance, Mind/Body Relaxation: - Group verbal and visual presentation with interactive activity on the components of exercise prescription. Introduces F.I.T.T principle from ACSM for exercise prescriptions. Reviews F.I.T.T.  principles of flexibility and balance exercise training including progression. Also discusses the mind body connection.  Reviews various relaxation techniques to help reduce and manage stress (i.e. Deep breathing, progressive muscle relaxation, and visualization). Balance handout provided to take home. Written material given at graduation.   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 HR 82 bpm     Max Ex. BP 140/72     2 Minute Post BP 134/72              Oxygen Initial Assessment:   Oxygen Re-Evaluation:   Oxygen Discharge (Final Oxygen Re-Evaluation):   Initial Exercise Prescription:  Initial Exercise Prescription - 05/06/23 1600       Date of Initial Exercise RX and Referring Provider   Date 05/06/23    Referring Provider Concha Pyo, FNP  Oxygen   Maintain Oxygen Saturation 88% or higher      Recumbant Bike   Level 1    RPM 50    Watts 15    Minutes 15    METs 1.91      NuStep   Level 1    SPM 80    Minutes 15    METs 1.91      Biostep-RELP   Level 1    SPM  50    Minutes 15    METs 1.91      Track   Laps 17    Minutes 15    METs 1.92      Prescription Details   Frequency (times per week) 2    Duration Progress to 30 minutes of continuous aerobic without signs/symptoms of physical distress      Intensity   THRR 40-80% of Max Heartrate 92-132    Ratings of Perceived Exertion 11-13    Perceived Dyspnea 0-4      Progression   Progression Continue to progress workloads to maintain intensity without signs/symptoms of physical distress.      Resistance Training   Training Prescription Yes    Weight 7 lb    Reps 10-15             Perform Capillary Blood Glucose checks as needed.  Exercise Prescription Changes:   Exercise Prescription Changes     Row Name 05/06/23 1600 06/01/23 1300 06/14/23 1700 06/29/23 1400 07/13/23 0700     Response to Exercise   Blood Pressure (Admit) 130/80 136/72 122/62 126/68 122/60   Blood Pressure (Exercise) 140/72 176/80 -- 144/76 --   Blood Pressure (Exit) 134/72 118/62 118/62 110/60 118/62   Heart Rate (Admit) 53 bpm 79 bpm 64 bpm 62 bpm 77 bpm   Heart Rate (Exercise) 82 bpm 95 bpm 86 bpm 95 bpm 100 bpm   Heart Rate (Exit) 44 bpm 67 bpm 52 bpm 56 bpm 61 bpm   Oxygen Saturation (Admit) 97 % -- -- -- --   Oxygen Saturation (Exercise) 95 % -- -- -- --   Oxygen Saturation (Exit) 98 % -- -- -- --   Rating of Perceived Exertion (Exercise) 15 15 15 16 15    Perceived Dyspnea (Exercise) 0 -- -- -- --   Symptoms L leg and hip 8/10, R foot 8/10 none back pain on track none none   Comments results First two weeks of exercise -- -- --   Duration Progress to 30 minutes of  aerobic without signs/symptoms of physical distress Progress to 30 minutes of  aerobic without signs/symptoms of physical distress Continue with 30 min of aerobic exercise without signs/symptoms of physical distress. Continue with 30 min of aerobic exercise without signs/symptoms of physical distress. Continue with 30 min of aerobic  exercise without signs/symptoms of physical distress.   Intensity THRR New THRR unchanged THRR unchanged THRR unchanged THRR unchanged     Progression   Progression Continue to progress workloads to maintain intensity without signs/symptoms of physical distress. Continue to progress workloads to maintain intensity without signs/symptoms of physical distress. Continue to progress workloads to maintain intensity without signs/symptoms of physical distress. Continue to progress workloads to maintain intensity without signs/symptoms of physical distress. Continue to progress workloads to maintain intensity without signs/symptoms of physical distress.   Average METs 1.91 2.01 2.26 2.01 2.14     Resistance Training   Training Prescription -- Yes Yes Yes Yes   Weight -- 7 lb  7 lb 7 lb 7 lb   Reps -- 10-15 10-15 10-15 10-15     Interval Training   Interval Training -- No No No No     Treadmill   MPH -- -- -- -- 1.1   Grade -- -- -- -- 0   Minutes -- -- -- -- 15   METs -- -- -- -- 1.84     Recumbant Bike   Level -- 1.5 1.2 -- 2   Watts -- 15 15 -- 15   Minutes -- 15 15 -- 15   METs -- 2.66 2.63 -- 2.63     NuStep   Level -- 1 2 2 2    Minutes -- 15 15 15 15    METs -- 2.1 2.4 2.7 2.1     Biostep-RELP   Level -- 1 1 3 1    Minutes -- 30 15 15 15    METs -- 2 3 2  2.1     Track   Laps -- 4 5 5  --   Minutes -- 15 15 15  --   METs -- 1.22 1.27 1.27 --     Oxygen   Maintain Oxygen Saturation -- 88% or higher 88% or higher 88% or higher 88% or higher            Exercise Comments:   Exercise Comments     Row Name 05/18/23 1124           Exercise Comments First full day of exercise!  Patient was oriented to gym and equipment including functions, settings, policies, and procedures.  Patient's individual exercise prescription and treatment plan were reviewed.  All starting workloads were established based on the results of the 6 minute walk test done at initial orientation visit.   The plan for exercise progression was also introduced and progression will be customized based on patient's performance and goals.                Exercise Goals and Review:   Exercise Goals     Row Name 05/06/23 1638             Exercise Goals   Increase Physical Activity Yes       Intervention Develop an individualized exercise prescription for aerobic and resistive training based on initial evaluation findings, risk stratification, comorbidities and participant's personal goals.;Provide advice, education, support and counseling about physical activity/exercise needs.       Expected Outcomes Short Term: Attend rehab on a regular basis to increase amount of physical activity.;Long Term: Add in home exercise to make exercise part of routine and to increase amount of physical activity.;Long Term: Exercising regularly at least 3-5 days a week.       Increase Strength and Stamina Yes       Intervention Provide advice, education, support and counseling about physical activity/exercise needs.;Develop an individualized exercise prescription for aerobic and resistive training based on initial evaluation findings, risk stratification, comorbidities and participant's personal goals.       Expected Outcomes Short Term: Increase workloads from initial exercise prescription for resistance, speed, and METs.;Short Term: Perform resistance training exercises routinely during rehab and add in resistance training at home;Long Term: Improve cardiorespiratory fitness, muscular endurance and strength as measured by increased METs and functional capacity ( )       Able to understand and use rate of perceived exertion (RPE) scale Yes       Intervention Provide education and explanation on how to use RPE scale  Expected Outcomes Short Term: Able to use RPE daily in rehab to express subjective intensity level;Long Term:  Able to use RPE to guide intensity level when exercising independently       Able to  understand and use Dyspnea scale Yes       Intervention Provide education and explanation on how to use Dyspnea scale       Expected Outcomes Short Term: Able to use Dyspnea scale daily in rehab to express subjective sense of shortness of breath during exertion;Long Term: Able to use Dyspnea scale to guide intensity level when exercising independently       Knowledge and understanding of Target Heart Rate Range (THRR) Yes       Intervention Provide education and explanation of THRR including how the numbers were predicted and where they are located for reference       Expected Outcomes Short Term: Able to state/look up THRR;Short Term: Able to use daily as guideline for intensity in rehab;Long Term: Able to use THRR to govern intensity when exercising independently       Able to check pulse independently Yes       Intervention Provide education and demonstration on how to check pulse in carotid and radial arteries.;Review the importance of being able to check your own pulse for safety during independent exercise       Expected Outcomes Short Term: Able to explain why pulse checking is important during independent exercise;Long Term: Able to check pulse independently and accurately       Understanding of Exercise Prescription Yes       Intervention Provide education, explanation, and written materials on patient's individual exercise prescription       Expected Outcomes Short Term: Able to explain program exercise prescription;Long Term: Able to explain home exercise prescription to exercise independently                Exercise Goals Re-Evaluation :  Exercise Goals Re-Evaluation     Row Name 05/18/23 1124 06/01/23 1402 06/14/23 1712 06/29/23 1448 07/08/23 1124     Exercise Goal Re-Evaluation   Exercise Goals Review Understanding of Exercise Prescription;Knowledge and understanding of Target Heart Rate Range (THRR);Able to understand and use rate of perceived exertion (RPE) scale;Able to  understand and use Dyspnea scale Increase Physical Activity;Increase Strength and Stamina;Understanding of Exercise Prescription Increase Physical Activity;Increase Strength and Stamina;Understanding of Exercise Prescription Increase Physical Activity;Increase Strength and Stamina;Understanding of Exercise Prescription Increase Physical Activity;Increase Strength and Stamina;Understanding of Exercise Prescription   Comments Reviewed RPE and dyspnea scale, THR and program prescription with pt today.  Pt voiced understanding and was given a copy of goals to take home. Piotr is off to a good start in the program. He has done well at level 1 on the T4 nustep and biostep, and level 1.5 on the recumbent bike. He also attempted to walk the track and was able to reach 4 laps. We will continue to monitor his progress in the program. Giulian is doing well in rehab. He went up to level 2 on the T4 nustep and was able to do 5 laps on the track prior to back pain. We will continue to monitor his progress in the program. Galo continues to do well in rehab. He has continued to walk the track and has walked 5 laps on the track prior to back pain. He also improved to level 3 on the biostep and continues to work at level 2 on the T4 nustep. We will  continue to monitor his progress in the program. Samuele continues to come to Rehab and has been doing well. He reports frequent leg cramps. He is on lasix and often low on electrolytes. Encouraged him to try mustard packets too see if that would help.   Expected Outcomes Short: Use RPE daily to regulate intensity. Long: Follow program prescription in THR. Short: Continue to follow current exercise prescription. Long: Continue exercise to improve strength and stamina. Short: Continue to push for more laps on the track as back pain tolerates. Long: Continue exercise to improve strength and stamina. Short: Continue to push for more laps on the track as tolerated by back pain. Long: Continue  exercise to improve strength and stamina. STG: continue to attend cardiac rehab. LTG: Continue exercise to improve strength and stamina    Row Name 07/13/23 0802             Exercise Goal Re-Evaluation   Exercise Goals Review Increase Physical Activity;Increase Strength and Stamina;Understanding of Exercise Prescription       Comments Valen is doing well in rehab. He recently began using the treadmill and did well at a speed of 1.1 mph with no incline. He also improved to level 2 on the recumbent bike and continues to work at level 2 on the T4 nustep. We will continue to monitor his progress in the program.       Expected Outcomes Short: Continue to progressively increase workloads. Long: Continue exercise to improve strength and stamina.                Discharge Exercise Prescription (Final Exercise Prescription Changes):  Exercise Prescription Changes - 07/13/23 0700       Response to Exercise   Blood Pressure (Admit) 122/60    Blood Pressure (Exit) 118/62    Heart Rate (Admit) 77 bpm    Heart Rate (Exercise) 100 bpm    Heart Rate (Exit) 61 bpm    Rating of Perceived Exertion (Exercise) 15    Symptoms none    Duration Continue with 30 min of aerobic exercise without signs/symptoms of physical distress.    Intensity THRR unchanged      Progression   Progression Continue to progress workloads to maintain intensity without signs/symptoms of physical distress.    Average METs 2.14      Resistance Training   Training Prescription Yes    Weight 7 lb    Reps 10-15      Interval Training   Interval Training No      Treadmill   MPH 1.1    Grade 0    Minutes 15    METs 1.84      Recumbant Bike   Level 2    Watts 15    Minutes 15    METs 2.63      NuStep   Level 2    Minutes 15    METs 2.1      Biostep-RELP   Level 1    Minutes 15    METs 2.1      Oxygen   Maintain Oxygen Saturation 88% or higher             Nutrition:  Target Goals: Understanding  of nutrition guidelines, daily intake of sodium 1500mg , cholesterol 200mg , calories 30% from fat and 7% or less from saturated fats, daily to have 5 or more servings of fruits and vegetables.  Education: All About Nutrition: -Group instruction provided by verbal, written material, interactive activities, discussions,  models, and posters to present general guidelines for heart healthy nutrition including fat, fiber, MyPlate, the role of sodium in heart healthy nutrition, utilization of the nutrition label, and utilization of this knowledge for meal planning. Follow up email sent as well. Written material given at graduation.   Biometrics:  Pre Biometrics - 05/06/23 1639       Pre Biometrics   Height 5' 9.8" (1.773 m)    Weight 161 lb 11.2 oz (73.3 kg)    Waist Circumference 42 inches    Hip Circumference 39 inches    Waist to Hip Ratio 1.08 %    BMI (Calculated) 23.33    Single Leg Stand 12.8 seconds              Nutrition Therapy Plan and Nutrition Goals:  Nutrition Therapy & Goals - 06/01/23 1314       Personal Nutrition Goals   Nutrition Goal Meet with dietician 2/20             Nutrition Assessments:  MEDIFICTS Score Key: >=70 Need to make dietary changes  40-70 Heart Healthy Diet <= 40 Therapeutic Level Cholesterol Diet  Flowsheet Row Cardiac Rehab from 05/18/2023 in [facility] Cardiac and Pulmonary Rehab  Picture Your Plate Total Score on Admission 55  Picture Your Plate Total Score on Discharge 55      Picture Your Plate Scores: <16 Unhealthy dietary pattern with much room for improvement. 41-50 Dietary pattern unlikely to meet recommendations for good health and room for improvement. 51-60 More healthful dietary pattern, with some room for improvement.  >60 Healthy dietary pattern, although there may be some specific behaviors that could be improved.    Nutrition Goals Re-Evaluation:  Nutrition Goals Re-Evaluation     Row Name 06/29/23 1147 07/08/23  1130           Goals   Comment Patient appetite is down, he doesnt report loss of taste or loss of enjoyment of food, but rather not feeling hungry. Reviewed the importance of calorie dense foods like peanut butter to help him meet nutrition needs without feeling the needs to eat as much food. Laurance is still working on the changes discussed last goal session. He still missing meals at times beacuse he is not hungry. Reminded him of some small snack ideas liek peanut butter crackers to help him better control his blood sugars and energy.      Expected Outcome STG: focus on calorie dense foods and meeting nutrition needs. LTG: follow a heart healthy diet STG: eat 3 times per day, use snacks if needed. LTG: follow heart healthy diet               Nutrition Goals Discharge (Final Nutrition Goals Re-Evaluation):  Nutrition Goals Re-Evaluation - 07/08/23 1130       Goals   Comment Aldan is still working on the changes discussed last goal session. He still missing meals at times beacuse he is not hungry. Reminded him of some small snack ideas liek peanut butter crackers to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

## 2023-07-29 ENCOUNTER — Encounter: Payer: No Typology Code available for payment source | Admitting: *Deleted

## 2023-07-29 DIAGNOSIS — Z952 Presence of prosthetic heart valve: Secondary | ICD-10-CM

## 2023-07-29 DIAGNOSIS — R0602 Shortness of breath: Secondary | ICD-10-CM | POA: Diagnosis not present

## 2023-07-29 NOTE — Progress Notes (Signed)
 Daily Session Note  Patient Details  Name: Jason Beck MRN: [id number] Date of Birth: 06/08/1955 Referring Provider:   Flowsheet Row Cardiac Rehab from 05/06/2023 in [facility] Cardiac and Pulmonary Rehab  Referring Provider Concha Pyo, FNP       Encounter Date: 07/29/2023  Check In:  Session Check In - 07/29/23 1128       Check-In   Supervising physician immediately available to respond to emergencies See telemetry face sheet for immediately available ER MD    Location [facility]-Cardiac & Pulmonary Rehab    Staff Present Cora Collum, RN, BSN, CCRP;Meredith Jewel Baize RN,BSN;Joseph Hood RCP,RRT,BSRT;Maxon Ambler BS, Exercise Physiologist;Noah Tickle, BS, Exercise Physiologist    Virtual Visit No    Medication changes reported     No    Fall or balance concerns reported    No    Warm-up and Cool-down Performed on first and last piece of equipment    Resistance Training Performed Yes    VAD Patient? No    PAD/SET Patient? No      Pain Assessment   Currently in Pain? No/denies                Social History   Tobacco Use  Smoking Status Every Day   Current packs/day: 0.50   Types: Cigarettes  Smokeless Tobacco Never  Tobacco Comments   Ready to quit: working on quit  taking meds   Counseling given: yes    Goals Met:  Independence with exercise equipment Exercise tolerated well No report of concerns or symptoms today  Goals Unmet:  Not Applicable  Comments: Pt able to follow exercise prescription today without complaint.  Will continue to monitor for progression.    Dr. Bethann Punches is Medical Director for [facility] Cardiac Rehabilitation.  Dr. Vida Rigger is Medical Director for [facility] Pulmonary Rehabilitation.

## 2023-08-03 ENCOUNTER — Encounter: Payer: No Typology Code available for payment source | Admitting: *Deleted

## 2023-08-03 DIAGNOSIS — R0602 Shortness of breath: Secondary | ICD-10-CM | POA: Diagnosis not present

## 2023-08-03 DIAGNOSIS — Z952 Presence of prosthetic heart valve: Secondary | ICD-10-CM

## 2023-08-03 NOTE — Progress Notes (Signed)
 Daily Session Note  Patient Details  Name: HESTON WIDENER MRN: [id number] Date of Birth: 13-Apr-1956 Referring Provider:   Flowsheet Row Cardiac Rehab from 05/06/2023 in [facility] Cardiac and Pulmonary Rehab  Referring Provider Canda Cera, FNP       Encounter Date: 08/03/2023  Check In:  Session Check In - 08/03/23 1129       Check-In   Supervising physician immediately available to respond to emergencies See telemetry face sheet for immediately available ER MD    Location [facility]-Cardiac & Pulmonary Rehab    Staff Present Lyell Samuel, MS, Exercise Physiologist;Maxon Conetta BS, Exercise Physiologist;Noah Tickle, BS, Exercise Physiologist;Gildardo Tickner, RN, BSN, CCRP    Virtual Visit No    Medication changes reported     No    Fall or balance concerns reported    No    Warm-up and Cool-down Performed on first and last piece of equipment    Resistance Training Performed Yes    VAD Patient? No    PAD/SET Patient? No      Pain Assessment   Currently in Pain? No/denies                Social History   Tobacco Use  Smoking Status Every Day   Current packs/day: 0.50   Types: Cigarettes  Smokeless Tobacco Never  Tobacco Comments   Ready to quit: working on quit  taking meds   Counseling given: yes    Goals Met:  Independence with exercise equipment Exercise tolerated well No report of concerns or symptoms today  Goals Unmet:  Not Applicable  Comments: Pt able to follow exercise prescription today without complaint.  Will continue to monitor for progression.    Dr. Firman Hughes is Medical Director for [facility] Cardiac Rehabilitation.  Dr. Fuad Aleskerov is Medical Director for [facility] Pulmonary Rehabilitation.

## 2023-08-05 ENCOUNTER — Encounter: Payer: No Typology Code available for payment source | Admitting: *Deleted

## 2023-08-05 DIAGNOSIS — Z952 Presence of prosthetic heart valve: Secondary | ICD-10-CM

## 2023-08-05 DIAGNOSIS — R0602 Shortness of breath: Secondary | ICD-10-CM

## 2023-08-05 NOTE — Progress Notes (Signed)
 Daily Session Note  Patient Details  Name: Jason Beck MRN: [id number] Date of Birth: 1955/08/23 Referring Provider:   Flowsheet Row Cardiac Rehab from 05/06/2023 in [facility] Cardiac and Pulmonary Rehab  Referring Provider Canda Cera, FNP       Encounter Date: 08/05/2023  Check In:  Session Check In - 08/05/23 1126       Check-In   Supervising physician immediately available to respond to emergencies See telemetry face sheet for immediately available ER MD    Location [facility]-Cardiac & Pulmonary Rehab    Staff Present Maxon Conetta BS, Exercise Physiologist;Noah Tickle, BS, Exercise Physiologist;Okey Zelek, RN, BSN, CCRP;Jason Martina Sledge RDN,LDN    Virtual Visit No    Medication changes reported     No    Fall or balance concerns reported    No    Warm-up and Cool-down Performed on first and last piece of equipment    Resistance Training Performed Yes    VAD Patient? No    PAD/SET Patient? No      Pain Assessment   Currently in Pain? No/denies                Social History   Tobacco Use  Smoking Status Every Day   Current packs/day: 0.50   Types: Cigarettes  Smokeless Tobacco Never  Tobacco Comments   Ready to quit: working on quit  taking meds   Counseling given: yes    Goals Met:  Independence with exercise equipment Exercise tolerated well No report of concerns or symptoms today  Goals Unmet:  Not Applicable  Comments: Pt able to follow exercise prescription today without complaint.  Will continue to monitor for progression.    Dr. Firman Hughes is Medical Director for [facility] Cardiac Rehabilitation.  Dr. Fuad Aleskerov is Medical Director for [facility] Pulmonary Rehabilitation.

## 2023-08-10 ENCOUNTER — Encounter: Payer: No Typology Code available for payment source | Admitting: *Deleted

## 2023-08-10 DIAGNOSIS — Z952 Presence of prosthetic heart valve: Secondary | ICD-10-CM

## 2023-08-10 DIAGNOSIS — R0602 Shortness of breath: Secondary | ICD-10-CM | POA: Diagnosis not present

## 2023-08-10 NOTE — Progress Notes (Signed)
 Daily Session Note  Patient Details  Name: Jason Beck MRN: [id number] Date of Birth: 1956-02-13 Referring Provider:   Flowsheet Row Cardiac Rehab from 05/06/2023 in [facility] Cardiac and Pulmonary Rehab  Referring Provider Canda Cera, FNP       Encounter Date: 08/10/2023  Check In:  Session Check In - 08/10/23 1126       Check-In   Supervising physician immediately available to respond to emergencies See telemetry face sheet for immediately available ER MD    Location [facility]-Cardiac & Pulmonary Rehab    Staff Present Lyell Samuel, MS, Exercise Physiologist;Maxon Conetta BS, Exercise Physiologist;Noah Tickle, BS, Exercise Physiologist;Shekelia Boutin, RN, BSN, CCRP;Meredith Manson Seitz RN,BSN;Jason Martina Sledge RDN,LDN    Virtual Visit No    Medication changes reported     No    Fall or balance concerns reported    No    Warm-up and Cool-down Performed on first and last piece of equipment    Resistance Training Performed Yes    VAD Patient? No    PAD/SET Patient? No      Pain Assessment   Currently in Pain? No/denies                Social History   Tobacco Use  Smoking Status Every Day   Current packs/day: 0.50   Types: Cigarettes  Smokeless Tobacco Never  Tobacco Comments   Ready to quit: working on quit  taking meds   Counseling given: yes    Goals Met:  Independence with exercise equipment Exercise tolerated well No report of concerns or symptoms today  Goals Unmet:  Not Applicable  Comments: Pt able to follow exercise prescription today without complaint.  Will continue to monitor for progression.    Dr. Firman Hughes is Medical Director for [facility] Cardiac Rehabilitation.  Dr. Fuad Aleskerov is Medical Director for [facility] Pulmonary Rehabilitation.

## 2023-08-12 ENCOUNTER — Encounter: Payer: No Typology Code available for payment source | Admitting: *Deleted

## 2023-08-12 DIAGNOSIS — Z952 Presence of prosthetic heart valve: Secondary | ICD-10-CM

## 2023-08-12 DIAGNOSIS — R0602 Shortness of breath: Secondary | ICD-10-CM

## 2023-08-12 NOTE — Progress Notes (Signed)
 Daily Session Note  Patient Details  Name: Jason Beck MRN: [id number] Date of Birth: 08-13-1955 Referring Provider:   Flowsheet Row Cardiac Rehab from 05/06/2023 in [facility] Cardiac and Pulmonary Rehab  Referring Provider Canda Cera, FNP       Encounter Date: 08/12/2023  Check In:  Session Check In - 08/12/23 1114       Check-In   Supervising physician immediately available to respond to emergencies See telemetry face sheet for immediately available ER MD    Location [facility]-Cardiac & Pulmonary Rehab    Staff Present Maxon Conetta BS, Exercise Physiologist;Noah Tickle, BS, Exercise Physiologist;Joseph Hood RCP,RRT,BSRT;Maud Sorenson, RN, BSN, CCRP;Meredith Manson Seitz RN,BSN    Virtual Visit No    Medication changes reported     No    Fall or balance concerns reported    No    Warm-up and Cool-down Performed on first and last piece of equipment    Resistance Training Performed Yes    VAD Patient? No    PAD/SET Patient? No      Pain Assessment   Currently in Pain? No/denies                Social History   Tobacco Use  Smoking Status Every Day   Current packs/day: 0.50   Types: Cigarettes  Smokeless Tobacco Never  Tobacco Comments   Ready to quit: working on quit  taking meds   Counseling given: yes    Goals Met:  Independence with exercise equipment Exercise tolerated well No report of concerns or symptoms today  Goals Unmet:  Not Applicable  Comments: Pt able to follow exercise prescription today without complaint.  Will continue to monitor for progression.    Dr. Firman Hughes is Medical Director for [facility] Cardiac Rehabilitation.  Dr. Fuad Aleskerov is Medical Director for [facility] Pulmonary Rehabilitation.

## 2023-08-17 ENCOUNTER — Encounter: Payer: No Typology Code available for payment source | Admitting: *Deleted

## 2023-08-17 DIAGNOSIS — Z952 Presence of prosthetic heart valve: Secondary | ICD-10-CM

## 2023-08-17 DIAGNOSIS — R0602 Shortness of breath: Secondary | ICD-10-CM

## 2023-08-17 NOTE — Progress Notes (Signed)
 Daily Session Note  Patient Details  Name: Jason Beck MRN: [id number] Date of Birth: Sep 17, 1955 Referring Provider:   Flowsheet Row Cardiac Rehab from 05/06/2023 in [facility] Cardiac and Pulmonary Rehab  Referring Provider Canda Cera, FNP       Encounter Date: 08/17/2023  Check In:  Session Check In - 08/17/23 1129       Check-In   Supervising physician immediately available to respond to emergencies See telemetry face sheet for immediately available ER MD    Location [facility]-Cardiac & Pulmonary Rehab    Staff Present Lyell Samuel, MS, Exercise Physiologist;Maxon Conetta BS, Exercise Physiologist;Noah Tickle, BS, Exercise Physiologist;Delrae Hagey, RN, BSN, CCRP    Virtual Visit No    Medication changes reported     No    Fall or balance concerns reported    No    Warm-up and Cool-down Performed on first and last piece of equipment    Resistance Training Performed Yes    VAD Patient? No    PAD/SET Patient? No      Pain Assessment   Currently in Pain? No/denies                Social History   Tobacco Use  Smoking Status Every Day   Current packs/day: 0.50   Types: Cigarettes  Smokeless Tobacco Never  Tobacco Comments   Ready to quit: working on quit  taking meds   Counseling given: yes    Goals Met:  Independence with exercise equipment Exercise tolerated well No report of concerns or symptoms today  Goals Unmet:  Not Applicable  Comments: Pt able to follow exercise prescription today without complaint.  Will continue to monitor for progression.    Dr. Firman Hughes is Medical Director for [facility] Cardiac Rehabilitation.  Dr. Fuad Aleskerov is Medical Director for [facility] Pulmonary Rehabilitation.

## 2023-08-19 ENCOUNTER — Encounter: Payer: No Typology Code available for payment source | Attending: Nurse Practitioner | Admitting: *Deleted

## 2023-08-19 VITALS — Ht 69.8 in | Wt 172.8 lb

## 2023-08-19 DIAGNOSIS — Z952 Presence of prosthetic heart valve: Secondary | ICD-10-CM | POA: Diagnosis present

## 2023-08-19 DIAGNOSIS — R0602 Shortness of breath: Secondary | ICD-10-CM | POA: Diagnosis present

## 2023-08-19 DIAGNOSIS — Z48812 Encounter for surgical aftercare following surgery on the circulatory system: Secondary | ICD-10-CM | POA: Insufficient documentation

## 2023-08-19 NOTE — Patient Instructions (Signed)
 Discharge Patient Instructions  Patient Details  Name: Jason Beck MRN: [id number] Date of Birth: 1955-12-09 Referring Provider:  [facility]   Number of Visits: 36  Reason for Discharge:  Patient reached a stable level of exercise. Patient independent in their exercise. Patient has met program and personal goals.   Diagnosis:  No diagnosis found.  Initial Exercise Prescription:  Initial Exercise Prescription - 05/06/23 1600       Date of Initial Exercise RX and Referring Provider   Date 05/06/23    Referring Provider Canda Cera, FNP      Oxygen   Maintain Oxygen Saturation 88% or higher      Recumbant Bike   Level 1    RPM 50    Watts 15    Minutes 15    METs 1.91      NuStep   Level 1    SPM 80    Minutes 15    METs 1.91      Biostep-RELP   Level 1    SPM 50    Minutes 15    METs 1.91      Track   Laps 17    Minutes 15    METs 1.92      Prescription Details   Frequency (times per week) 2    Duration Progress to 30 minutes of continuous aerobic without signs/symptoms of physical distress      Intensity   THRR 40-80% of Max Heartrate 92-132    Ratings of Perceived Exertion 11-13    Perceived Dyspnea 0-4      Progression   Progression Continue to progress workloads to maintain intensity without signs/symptoms of physical distress.      Resistance Training   Training Prescription Yes    Weight 7 lb    Reps 10-15             Discharge Exercise Prescription (Final Exercise Prescription Changes):  Exercise Prescription Changes - 08/12/23 1500       Response to Exercise   Blood Pressure (Admit) 122/56    Blood Pressure (Exit) 124/60    Heart Rate (Admit) 71 bpm    Heart Rate (Exercise) 91 bpm    Heart Rate (Exit) 64 bpm    Rating of Perceived Exertion (Exercise) 15    Symptoms none    Duration Continue with 30 min of aerobic exercise without signs/symptoms of physical distress.    Intensity THRR unchanged       Progression   Progression Continue to progress workloads to maintain intensity without signs/symptoms of physical distress.    Average METs 2.16      Resistance Training   Training Prescription Yes    Weight 7 lb    Reps 10-15      Interval Training   Interval Training No      Recumbant Bike   Level 4    Watts 15    Minutes 15    METs 2.59      NuStep   Level 3    Minutes 15    METs 2.3      T5 Nustep   Level 2    Minutes 15    METs 1.9      Biostep-RELP   Level 2    Minutes 15    METs 2      Home Exercise Plan   Plans to continue exercise at Home (comment)   Jason Beck plans to walk outside around his property.  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   Frequency Add 3 additional days to program exercise sessions.    Initial Home Exercises Provided 08/10/23      Oxygen   Maintain Oxygen Saturation 88% or higher             Functional Capacity:  6 Minute Walk     Row Name 05/06/23 1632 08/19/23 1121       6 Minute Walk   Phase Initial Discharge    Distance 560 feet 655 feet    Distance % Change -- 17 %    Distance Feet Change -- 95 ft    Walk Time 4.87 minutes 5.29 minutes    # of Rest Breaks 3 2    MPH 1.3 1.4    METS 1.91 1.96    RPE 15 17    Perceived Dyspnea  0 2    VO2 Peak 6.67 6.87    Symptoms Yes (comment) Yes (comment)    Comments L hip and knee pain 8/10, R foot pain 8/10 Right calf cramping    Resting HR 53 bpm 66 bpm    Resting BP 130/80 98/54    Resting Oxygen Saturation  97 % 93 %    Exercise Oxygen Saturation  during 6 min walk 95 % 92 %    Max Ex. HR 82 bpm 77 bpm    Max Ex. BP 140/72 148/62    2 Minute Post BP 134/72 --             Nutrition & Weight - Outcomes:  Pre Biometrics - 05/06/23 1639       Pre Biometrics   Height 5' 9.8" (1.773 m)    Weight 161 lb 11.2 oz (73.3 kg)    Waist Circumference 42 inches    Hip Circumference 39 inches    Waist to Hip Ratio 1.08 %    BMI  (Calculated) 23.33    Single Leg Stand 12.8 seconds             Post Biometrics - 08/19/23 1124        Post  Biometrics   Height 5' 9.8" (1.773 m)    Weight 172 lb 12.8 oz (78.4 kg)    Waist Circumference 39 inches    Hip Circumference 39 inches    Waist to Hip Ratio 1 %    BMI (Calculated) 24.93    Single Leg Stand 5.78 seconds

## 2023-08-19 NOTE — Progress Notes (Signed)
 Daily Session Note  Patient Details  Name: Jason Beck MRN: [id number] Date of Birth: 04-Sep-1955 Referring Provider:   Flowsheet Row Cardiac Rehab from 05/06/2023 in [facility] Cardiac and Pulmonary Rehab  Referring Provider Canda Cera, FNP       Encounter Date: 08/19/2023  Check In:  Session Check In - 08/19/23 1149       Check-In   Supervising physician immediately available to respond to emergencies See telemetry face sheet for immediately available ER MD    Location [facility]-Cardiac & Pulmonary Rehab    Staff Present Maxon Conetta BS, Exercise Physiologist;Tayva Easterday, RN, BSN, CCRP;Meredith Manson Seitz RN,BSN;[employer]    Virtual Visit No    Medication changes reported     No    Fall or balance concerns reported    No    Warm-up and Cool-down Performed on first and last piece of equipment    Resistance Training Performed Yes    VAD Patient? No    PAD/SET Patient? No      Pain Assessment   Currently in Pain? No/denies              6 Minute Walk     Row Name 05/06/23 1632 08/19/23 1121       6 Minute Walk   Phase Initial Discharge    Distance 560 feet 655 feet    Distance % Change -- 17 %    Distance Feet Change -- 95 ft    Walk Time 4.87 minutes 5.29 minutes    # of Rest Breaks 3 2    MPH 1.3 1.4    METS 1.91 1.96    RPE 15 17    Perceived Dyspnea  0 2    VO2 Peak 6.67 6.87    Symptoms Yes (comment) Yes (comment)    Comments L hip and knee pain 8/10, R foot pain 8/10 Right calf cramping    Resting HR 53 bpm 66 bpm    Resting BP 130/80 98/54    Resting Oxygen Saturation  97 % 93 %    Exercise Oxygen Saturation  during 6 min walk 95 % 92 %    Max Ex. HR 82 bpm 77 bpm    Max Ex. BP 140/72 148/62    2 Minute Post BP 134/72 --               Social History   Tobacco Use  Smoking Status Every Day   Current packs/day: 0.50   Types: Cigarettes  Smokeless Tobacco Never  Tobacco Comments   Ready to quit: working on quit  taking meds    Counseling given: yes    Goals Met:  Independence with exercise equipment Exercise tolerated well No report of concerns or symptoms today  Goals Unmet:  Not Applicable  Comments: Pt able to follow exercise prescription today without complaint.  Will continue to monitor for progression.    Dr. Firman Hughes is Medical Director for [facility] Cardiac Rehabilitation.  Dr. Fuad Aleskerov is Medical Director for [facility] Pulmonary Rehabilitation.

## 2023-08-24 ENCOUNTER — Encounter: Payer: No Typology Code available for payment source | Admitting: *Deleted

## 2023-08-24 DIAGNOSIS — Z952 Presence of prosthetic heart valve: Secondary | ICD-10-CM

## 2023-08-24 DIAGNOSIS — R0602 Shortness of breath: Secondary | ICD-10-CM

## 2023-08-24 DIAGNOSIS — Z48812 Encounter for surgical aftercare following surgery on the circulatory system: Secondary | ICD-10-CM | POA: Diagnosis not present

## 2023-08-24 NOTE — Progress Notes (Signed)
 Jason Beck

## 2023-08-24 NOTE — Progress Notes (Signed)
 Daily Session Note  Patient Details  Name: Jason Beck MRN: [id number] Date of Birth: 1956/04/17 Referring Provider:   Flowsheet Row Cardiac Rehab from 05/06/2023 in [facility] Cardiac and Pulmonary Rehab  Referring Provider Canda Cera, FNP       Encounter Date: 08/24/2023  Check In:  Session Check In - 08/24/23 1123       Check-In   Supervising physician immediately available to respond to emergencies See telemetry face sheet for immediately available ER MD    Location [facility]-Cardiac & Pulmonary Rehab    Staff Present Maud Sorenson, RN, BSN, CCRP;Meredith Manson Seitz RN,BSN;Margaret Best, MS, Exercise Physiologist;Maxon Conetta BS, Exercise Physiologist;Jason Martina Sledge RDN,LDN    Virtual Visit No    Medication changes reported     No    Fall or balance concerns reported    No    Warm-up and Cool-down Performed on first and last piece of equipment    Resistance Training Performed No    VAD Patient? No    PAD/SET Patient? No      Pain Assessment   Currently in Pain? No/denies                Social History   Tobacco Use  Smoking Status Every Day   Current packs/day: 0.50   Types: Cigarettes  Smokeless Tobacco Never  Tobacco Comments   Ready to quit: working on quit  taking meds   Counseling given: yes    Goals Met:  Independence with exercise equipment Exercise tolerated well No report of concerns or symptoms today  Goals Unmet:  Not Applicable  Comments: Pt able to follow exercise prescription today without complaint.  Will continue to monitor for progression.    Dr. Firman Hughes is Medical Director for [facility] Cardiac Rehabilitation.  Dr. Fuad Aleskerov is Medical Director for [facility] Pulmonary Rehabilitation.

## 2023-08-25 DIAGNOSIS — Z952 Presence of prosthetic heart valve: Secondary | ICD-10-CM

## 2023-08-25 NOTE — Progress Notes (Signed)
 Cardiac Individual Treatment Plan  Patient Details  Name: NOHE KUCZMARSKI MRN: [id number] Date of Birth: 1956/03/11 Referring Provider:   Flowsheet Row Cardiac Rehab from 05/06/2023 in [facility] Cardiac and Pulmonary Rehab  Referring Provider Canda Cera, FNP       Initial Encounter Date:  Flowsheet Row Cardiac Rehab from 05/06/2023 in [facility] Cardiac and Pulmonary Rehab  Date 05/06/23       Visit Diagnosis: S/P TAVR (transcatheter aortic valve replacement)  Patient's Home Medications on Admission:  Current Outpatient Medications:    albuterol  (PROVENTIL ) (2.5 MG/3ML) 0.083% nebulizer solution, , Disp: , Rfl:    albuterol  (VENTOLIN  HFA) 108 (90 Base) MCG/ACT inhaler, INHALE 2 PUFFS BY ORAL INHALATION EVERY 4 TO 6 HOURS AS NEEDED FOR CHRONIC OBSTRUCTIVE LUNG DISEASE FOR BREATHING. BE SURE TO WASH MOUTHPIECE WITH WARM WATER ONCE A WEEK, Disp: , Rfl:    amiodarone  (PACERONE ) 200 MG tablet, Take 1 tablet (200 mg total) by mouth daily., Disp: 90 tablet, Rfl: 1   apixaban  (ELIQUIS ) 5 MG TABS tablet, Take 1 tablet (5 mg total) by mouth 2 (two) times daily., Disp: 60 tablet, Rfl: 15   atorvastatin  (LIPITOR ) 80 MG tablet, Take 1 tablet (80 mg total) by mouth daily., Disp: 90 tablet, Rfl: 3   buprenorphine (SUBUTEX) 2 MG SUBL SL tablet, Place 2 mg under the tongue every 12 (twelve) hours., Disp: , Rfl:    buPROPion  (WELLBUTRIN  SR) 150 MG 12 hr tablet, Take 150 mg by mouth daily., Disp: , Rfl:    ciclesonide  (ALVESCO ) 160 MCG/ACT inhaler, Inhale 2 puffs into the lungs 2 (two) times daily., Disp: , Rfl:    clopidogrel  (PLAVIX ) 75 MG tablet, Take 1 tablet (75 mg total) by mouth daily. (Patient not taking: Reported on 04/29/2023), Disp: 30 tablet, Rfl: 1   docusate sodium (COLACE) 50 MG capsule, Take 50 mg by mouth daily as needed for mild constipation., Disp: , Rfl:    doxycycline  (ADOXA) 100 MG tablet, Take 100 mg by mouth 2 (two) times daily. (Patient not taking: Reported on 04/29/2023), Disp: , Rfl:     empagliflozin  (JARDIANCE ) 10 MG TABS tablet, Take 10 mg by mouth daily., Disp: , Rfl:    ezetimibe  (ZETIA ) 10 MG tablet, Take 1 tablet by mouth daily., Disp: , Rfl:    ferrous sulfate 325 (65 FE) MG tablet, Take 325 mg by mouth every other day., Disp: , Rfl:    furosemide  (LASIX ) 40 MG tablet, Take 1 tablet (40 mg total) by mouth daily., Disp: 30 tablet, Rfl: 11   gabapentin  (NEURONTIN ) 400 MG capsule, Take 800 mg by mouth 3 (three) times daily., Disp: , Rfl:    losartan  (COZAAR ) 25 MG tablet, Take 1 tablet (25 mg total) by mouth daily. (Patient taking differently: Take 12.5 mg by mouth daily.), Disp: 30 tablet, Rfl: 11   metFORMIN  (GLUCOPHAGE ) 500 MG tablet, Take 500 mg by mouth 2 (two) times daily with a meal., Disp: , Rfl:    methadone  (DOLOPHINE ) 10 MG tablet, Take 5 tablets by mouth 2 (two) times daily., Disp: , Rfl:    metoprolol  succinate (TOPROL  XL) 50 MG 24 hr tablet, Take 1 tablet (50 mg total) by mouth daily. Take with or immediately following a meal., Disp: 30 tablet, Rfl: 11   midodrine  (PROAMATINE ) 10 MG tablet, Take 1 tablet (10 mg total) by mouth 3 (three) times daily with meals. (Patient not taking: Reported on 04/29/2023), Disp: 90 tablet, Rfl: 1   naloxone (NARCAN) nasal spray 4 mg/0.1 mL,  Place 0.4 mg into the nose once., Disp: , Rfl:    nitroGLYCERIN  (NITROSTAT ) 0.4 MG SL tablet, Place 1 tablet (0.4 mg total) under the tongue every 5 (five) minutes x 3 doses as needed for chest pain., Disp: 30 tablet, Rfl: 0   omeprazole (PRILOSEC) 20 MG capsule, Take 20 mg by mouth daily., Disp: , Rfl:    oxyCODONE  (OXY IR/ROXICODONE ) 5 MG immediate release tablet, Take 10 mg by mouth every 4 (four) hours as needed for severe pain. (Patient not taking: Reported on 04/29/2023), Disp: , Rfl:    oxycodone  (OXY-IR) 5 MG capsule, Take 5 mg by mouth every 4 (four) hours as needed., Disp: , Rfl:    pantoprazole  (PROTONIX ) 40 MG tablet, TAKE 1 TABLET BY MOUTH EVERY DAY (Patient not taking: Reported on  04/29/2023), Disp: 30 tablet, Rfl: 1   potassium chloride  SA (KLOR-CON  M) 20 MEQ tablet, Take 1 tablet (20 mEq total) by mouth daily., Disp: 30 tablet, Rfl: 1   ranolazine  (RANEXA ) 500 MG 12 hr tablet, Take 1 tablet (500 mg total) by mouth 2 (two) times daily. (Patient not taking: Reported on 04/29/2023), Disp: 60 tablet, Rfl: 11   thiamine  (VITAMIN B-1) 100 MG tablet, Take 1 tablet (100 mg total) by mouth daily., Disp: 30 tablet, Rfl: 3   ticagrelor (BRILINTA) 90 MG TABS tablet, Take 1 tablet by mouth 2 (two) times daily., Disp: , Rfl:    tiotropium (SPIRIVA ) 18 MCG inhalation capsule, Place 1 capsule (18 mcg total) into inhaler and inhale daily., Disp: 30 capsule, Rfl: 1   Tiotropium Bromide -Olodaterol 2.5-2.5 MCG/ACT AERS, , Disp: , Rfl:    traZODone  (DESYREL ) 50 MG tablet, Take 25 mg by mouth at bedtime., Disp: , Rfl:   Past Medical History: Past Medical History:  Diagnosis Date   Asthma    Atrial fibrillation (HCC)    Bicuspid aortic valve    CHF (congestive heart failure) (HCC)    COPD (chronic obstructive pulmonary disease) (HCC)    Depression    Diabetes mellitus without complication (HCC)    Hypertension     Tobacco Use: Social History   Tobacco Use  Smoking Status Every Day   Current packs/day: 0.50   Types: Cigarettes  Smokeless Tobacco Never  Tobacco Comments   Ready to quit: working on quit  taking meds   Counseling given: yes    Labs: Review Flowsheet  More data may exist      Latest Ref Rng & Units 03/20/2022 03/21/2022 04/19/2022 05/02/2022 06/16/2022  Labs for ITP Cardiac and Pulmonary Rehab  Cholestrol 0 - 200 mg/dL 027  253  - - -  LDL (calc) 0 - 99 mg/dL 94  96  - - -  HDL-C >66 mg/dL 36  39  - - -  Trlycerides <150 mg/dL 440  82  - - -  Hemoglobin A1c 4.8 - 5.6 % - 5.8  6.4  - -  Bicarbonate 20.0 - 28.0 mmol/L - - - 19.9  26.4   Acid-base deficit 0.0 - 2.0 mmol/L - - - 3.3  -  O2 Saturation % - - - 78.8  95.1      Exercise Target Goals: Exercise  Program Goal: Individual exercise prescription set using results from initial 6 min walk test and THRR while considering  patient's activity barriers and safety.   Exercise Prescription Goal: Initial exercise prescription builds to 30-45 minutes a day of aerobic activity, 2-3 days per week.  Home exercise guidelines will be given to  patient during program as part of exercise prescription that the participant will acknowledge.   Education: Aerobic Exercise: - Group verbal and visual presentation on the components of exercise prescription. Introduces F.I.T.T principle from ACSM for exercise prescriptions.  Reviews F.I.T.T. principles of aerobic exercise including progression. Written material given at graduation.   Education: Resistance Exercise: - Group verbal and visual presentation on the components of exercise prescription. Introduces F.I.T.T principle from ACSM for exercise prescriptions  Reviews F.I.T.T. principles of resistance exercise including progression. Written material given at graduation.    Education: Exercise & Equipment Safety: - Individual verbal instruction and demonstration of equipment use and safety with use of the equipment. Flowsheet Row Cardiac Rehab from 08/05/2023 in [facility] Cardiac and Pulmonary Rehab  Date 05/06/23  Educator MB  Instruction Review Code 1- Verbalizes Understanding       Education: Exercise Physiology & General Exercise Guidelines: - Group verbal and written instruction with models to review the exercise physiology of the cardiovascular system and associated critical values. Provides general exercise guidelines with specific guidelines to those with heart or lung disease.    Education: Flexibility, Balance, Mind/Body Relaxation: - Group verbal and visual presentation with interactive activity on the components of exercise prescription. Introduces F.I.T.T principle from ACSM for exercise prescriptions. Reviews F.I.T.T. principles of flexibility and  balance exercise training including progression. Also discusses the mind body connection.  Reviews various relaxation techniques to help reduce and manage stress (i.e. Deep breathing, progressive muscle relaxation, and visualization). Balance handout provided to take home. Written material given at graduation.   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 in the R foot causing lots of pain and some balance issues    Cardiac Risk Stratification High             6 Minute Walk:  6 Minute Walk     Row Name 05/06/23 1632 08/19/23 1121       6 Minute Walk   Phase Initial Discharge    Distance 560 feet 655 feet    Distance % Change -- 17 %    Distance Feet Change -- 95 ft    Walk Time 4.87 minutes 5.29 minutes    # of Rest Breaks 3 2    MPH 1.3 1.4    METS 1.91 1.96    RPE 15 17    Perceived Dyspnea  0 2    VO2 Peak 6.67 6.87    Symptoms Yes (comment) Yes (comment)    Comments L hip and knee pain 8/10, R foot pain 8/10 Right calf cramping    Resting HR 53 bpm 66 bpm    Resting BP 130/80 98/54    Resting Oxygen Saturation  97 % 93 %    Exercise Oxygen Saturation  during 6 min walk 95 % 92 %    Max Ex. HR 82 bpm 77 bpm    Max Ex. BP 140/72 148/62    2 Minute Post BP 134/72 --             Oxygen Initial Assessment:   Oxygen Re-Evaluation:   Oxygen Discharge (Final Oxygen Re-Evaluation):   Initial Exercise Prescription:  Initial Exercise Prescription - 05/06/23 1600  Date of Initial Exercise RX and Referring Provider   Date 05/06/23    Referring Provider Canda Cera, FNP      Oxygen   Maintain Oxygen Saturation 88% or higher      Recumbant Bike   Level 1     RPM 50    Watts 15    Minutes 15    METs 1.91      NuStep   Level 1    SPM 80    Minutes 15    METs 1.91      Biostep-RELP   Level 1    SPM 50    Minutes 15    METs 1.91      Track   Laps 17    Minutes 15    METs 1.92      Prescription Details   Frequency (times per week) 2    Duration Progress to 30 minutes of continuous aerobic without signs/symptoms of physical distress      Intensity   THRR 40-80% of Max Heartrate 92-132    Ratings of Perceived Exertion 11-13    Perceived Dyspnea 0-4      Progression   Progression Continue to progress workloads to maintain intensity without signs/symptoms of physical distress.      Resistance Training   Training Prescription Yes    Weight 7 lb    Reps 10-15             Perform Capillary Blood Glucose checks as needed.  Exercise Prescription Changes:   Exercise Prescription Changes     Row Name 05/06/23 1600 06/01/23 1300 06/14/23 1700 06/29/23 1400 07/13/23 0700     Response to Exercise   Blood Pressure (Admit) 130/80 136/72 122/62 126/68 122/60   Blood Pressure (Exercise) 140/72 176/80 -- 144/76 --   Blood Pressure (Exit) 134/72 118/62 118/62 110/60 118/62   Heart Rate (Admit) 53 bpm 79 bpm 64 bpm 62 bpm 77 bpm   Heart Rate (Exercise) 82 bpm 95 bpm 86 bpm 95 bpm 100 bpm   Heart Rate (Exit) 44 bpm 67 bpm 52 bpm 56 bpm 61 bpm   Oxygen Saturation (Admit) 97 % -- -- -- --   Oxygen Saturation (Exercise) 95 % -- -- -- --   Oxygen Saturation (Exit) 98 % -- -- -- --   Rating of Perceived Exertion (Exercise) 15 15 15 16 15    Perceived Dyspnea (Exercise) 0 -- -- -- --   Symptoms L leg and hip 8/10, R foot 8/10 none back pain on track none none   Comments results First two weeks of exercise -- -- --   Duration Progress to 30 minutes of  aerobic without signs/symptoms of physical distress Progress to 30 minutes of  aerobic without signs/symptoms of physical distress Continue with 30 min of aerobic exercise without  signs/symptoms of physical distress. Continue with 30 min of aerobic exercise without signs/symptoms of physical distress. Continue with 30 min of aerobic exercise without signs/symptoms of physical distress.   Intensity THRR New THRR unchanged THRR unchanged THRR unchanged THRR unchanged     Progression   Progression Continue to progress workloads to maintain intensity without signs/symptoms of physical distress. Continue to progress workloads to maintain intensity without signs/symptoms of physical distress. Continue to progress workloads to maintain intensity without signs/symptoms of physical distress. Continue to progress workloads to maintain intensity without signs/symptoms of physical distress. Continue to progress workloads to maintain intensity without signs/symptoms of physical distress.   Average METs 1.91  2.01 2.26 2.01 2.14     Resistance Training   Training Prescription -- Yes Yes Yes Yes   Weight -- 7 lb 7 lb 7 lb 7 lb   Reps -- 10-15 10-15 10-15 10-15     Interval Training   Interval Training -- No No No No     Treadmill   MPH -- -- -- -- 1.1   Grade -- -- -- -- 0   Minutes -- -- -- -- 15   METs -- -- -- -- 1.84     Recumbant Bike   Level -- 1.5 1.2 -- 2   Watts -- 15 15 -- 15   Minutes -- 15 15 -- 15   METs -- 2.66 2.63 -- 2.63     NuStep   Level -- 1 2 2 2    Minutes -- 15 15 15 15    METs -- 2.1 2.4 2.7 2.1     Biostep-RELP   Level -- 1 1 3 1    Minutes -- 30 15 15 15    METs -- 2 3 2  2.1     Track   Laps -- 4 5 5  --   Minutes -- 15 15 15  --   METs -- 1.22 1.27 1.27 --     Oxygen   Maintain Oxygen Saturation -- 88% or higher 88% or higher 88% or higher 88% or higher    Row Name 07/28/23 1400 08/10/23 1100 08/12/23 1500 08/24/23 1400       Response to Exercise   Blood Pressure (Admit) 120/58 -- 122/56 98/54    Blood Pressure (Exercise) -- -- -- 148/62    Blood Pressure (Exit) 108/60 -- 124/60 126/60    Heart Rate (Admit) 63 bpm -- 71 bpm 66 bpm     Heart Rate (Exercise) 97 bpm -- 91 bpm 88 bpm    Heart Rate (Exit) 53 bpm -- 64 bpm 68 bpm    Oxygen Saturation (Admit) -- -- -- 94 %    Oxygen Saturation (Exercise) -- -- -- 95 %    Oxygen Saturation (Exit) -- -- -- 94 %    Rating of Perceived Exertion (Exercise) 15 -- 15 17    Perceived Dyspnea (Exercise) -- -- -- 2    Symptoms none -- none none    Duration Continue with 30 min of aerobic exercise without signs/symptoms of physical distress. -- Continue with 30 min of aerobic exercise without signs/symptoms of physical distress. Continue with 30 min of aerobic exercise without signs/symptoms of physical distress.    Intensity THRR unchanged -- THRR unchanged THRR unchanged      Progression   Progression Continue to progress workloads to maintain intensity without signs/symptoms of physical distress. -- Continue to progress workloads to maintain intensity without signs/symptoms of physical distress. Continue to progress workloads to maintain intensity without signs/symptoms of physical distress.    Average METs 2.08 -- 2.16 2.05      Resistance Training   Training Prescription Yes -- Yes Yes    Weight 7 lb -- 7 lb 7 lb    Reps 10-15 -- 10-15 10-15      Interval Training   Interval Training No -- No No      Treadmill   MPH -- -- -- 0.6    Grade -- -- -- 0    Minutes -- -- -- 15    METs -- -- -- 1.46      Recumbant Bike   Level 3.6 -- 4 4.3  Watts 15 -- 15 15    Minutes 15 -- 15 15    METs 3 -- 2.59 2.6      NuStep   Level 1 -- 3 4    Minutes 15 -- 15 15    METs 2 -- 2.3 2.8      T5 Nustep   Level 4  T6 -- 2 --    Minutes 15 -- 15 --    METs 2.1 -- 1.9 --      Biostep-RELP   Level 1 -- 2 2    Minutes 15 -- 15 15    METs 2 -- 2 2      Track   Laps -- -- -- 5    Minutes -- -- -- 15    METs -- -- -- 1.27      Home Exercise Plan   Plans to continue exercise at -- Home (comment)  [name] plans to walk outside around his property. He also plans on doing general  carpentry work as well as some plumbing. Home (comment)  Lynda plans to walk outside around his property. 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 Home (comment)  Eugene plans to walk outside around his property. 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    Frequency -- Add 3 additional days to program exercise sessions. Add 3 additional days to program exercise sessions. Add 3 additional days to program exercise sessions.    Initial Home Exercises Provided -- 08/10/23 08/10/23 08/10/23      Oxygen   Maintain Oxygen Saturation 88% or higher 88% or higher 88% or higher 88% or higher             Exercise Comments:   Exercise Comments     Row Name 05/18/23 1124           Exercise Comments First full day of exercise!  Patient was oriented to gym and equipment including functions, settings, policies, and procedures.  Patient's individual exercise prescription and treatment plan were reviewed.  All starting workloads were established based on the results of the 6 minute walk test done at initial orientation visit.  The plan for exercise progression was also introduced and progression will be customized based on patient's performance and goals.                Exercise Goals and Review:   Exercise Goals     Row Name 05/06/23 1638             Exercise Goals   Increase Physical Activity Yes       Intervention Develop an individualized exercise prescription for aerobic and resistive training based on initial evaluation findings, risk stratification, comorbidities and participant's personal goals.;Provide advice, education, support and counseling about physical activity/exercise needs.       Expected Outcomes Short Term: Attend rehab on a regular basis to increase amount of physical activity.;Long Term: Add in home exercise to make exercise part of routine and to increase amount of physical activity.;Long Term: Exercising  regularly at least 3-5 days a week.       Increase Strength and Stamina Yes       Intervention Provide advice, education, support and counseling about physical activity/exercise needs.;Develop an individualized exercise prescription for aerobic and resistive training based on initial evaluation findings, risk stratification, comorbidities and participant's personal goals.       Expected Outcomes Short Term: Increase workloads  from initial exercise prescription for resistance, speed, and METs.;Short Term: Perform resistance training exercises routinely during rehab and add in resistance training at home;Long Term: Improve cardiorespiratory fitness, muscular endurance and strength as measured by increased METs and functional capacity ( )       Able to understand and use rate of perceived exertion (RPE) scale Yes       Intervention Provide education and explanation on how to use RPE scale       Expected Outcomes Short Term: Able to use RPE daily in rehab to express subjective intensity level;Long Term:  Able to use RPE to guide intensity level when exercising independently       Able to understand and use Dyspnea scale Yes       Intervention Provide education and explanation on how to use Dyspnea scale       Expected Outcomes Short Term: Able to use Dyspnea scale daily in rehab to express subjective sense of shortness of breath during exertion;Long Term: Able to use Dyspnea scale to guide intensity level when exercising independently       Knowledge and understanding of Target Heart Rate Range (THRR) Yes       Intervention Provide education and explanation of THRR including how the numbers were predicted and where they are located for reference       Expected Outcomes Short Term: Able to state/look up THRR;Short Term: Able to use daily as guideline for intensity in rehab;Long Term: Able to use THRR to govern intensity when exercising independently       Able to check pulse independently Yes        Intervention Provide education and demonstration on how to check pulse in carotid and radial arteries.;Review the importance of being able to check your own pulse for safety during independent exercise       Expected Outcomes Short Term: Able to explain why pulse checking is important during independent exercise;Long Term: Able to check pulse independently and accurately       Understanding of Exercise Prescription Yes       Intervention Provide education, explanation, and written materials on patient's individual exercise prescription       Expected Outcomes Short Term: Able to explain program exercise prescription;Long Term: Able to explain home exercise prescription to exercise independently                Exercise Goals Re-Evaluation :  Exercise Goals Re-Evaluation     Row Name 05/18/23 1124 06/01/23 1402 06/14/23 1712 06/29/23 1448 07/08/23 1124     Exercise Goal Re-Evaluation   Exercise Goals Review Understanding of Exercise Prescription;Knowledge and understanding of Target Heart Rate Range (THRR);Able to understand and use rate of perceived exertion (RPE) scale;Able to understand and use Dyspnea scale Increase Physical Activity;Increase Strength and Stamina;Understanding of Exercise Prescription Increase Physical Activity;Increase Strength and Stamina;Understanding of Exercise Prescription Increase Physical Activity;Increase Strength and Stamina;Understanding of Exercise Prescription Increase Physical Activity;Increase Strength and Stamina;Understanding of Exercise Prescription   Comments Reviewed RPE and dyspnea scale, THR and program prescription with pt today.  Pt voiced understanding and was given a copy of goals to take home. Kaedin is off to a good start in the program. He has done well at level 1 on the T4 nustep and biostep, and level 1.5 on the recumbent bike. He also attempted to walk the track and was able to reach 4 laps. We will continue to monitor his progress in the program.  Javen is doing well in rehab.  He went up to level 2 on the T4 nustep and was able to do 5 laps on the track prior to back pain. We will continue to monitor his progress in the program. Lochlen continues to do well in rehab. He has continued to walk the track and has walked 5 laps on the track prior to back pain. He also improved to level 3 on the biostep and continues to work at level 2 on the T4 nustep. We will continue to monitor his progress in the program. Layten continues to come to Rehab and has been doing well. He reports frequent leg cramps. He is on lasix  and often low on electrolytes. Encouraged him to try mustard packets too see if that would help.   Expected Outcomes Short: Use RPE daily to regulate intensity. Long: Follow program prescription in THR. Short: Continue to follow current exercise prescription. Long: Continue exercise to improve strength and stamina. Short: Continue to push for more laps on the track as back pain tolerates. Long: Continue exercise to improve strength and stamina. Short: Continue to push for more laps on the track as tolerated by back pain. Long: Continue exercise to improve strength and stamina. STG: continue to attend cardiac rehab. LTG: Continue exercise to improve strength and stamina    Row Name 07/13/23 0802 07/28/23 1417 08/10/23 1141 08/12/23 1544 08/24/23 1440     Exercise Goal Re-Evaluation   Exercise Goals Review Increase Physical Activity;Increase Strength and Stamina;Understanding of Exercise Prescription Increase Physical Activity;Increase Strength and Stamina;Understanding of Exercise Prescription Increase Physical Activity;Increase Strength and Stamina;Understanding of Exercise Prescription Increase Physical Activity;Increase Strength and Stamina;Understanding of Exercise Prescription Increase Physical Activity;Increase Strength and Stamina;Understanding of Exercise Prescription   Comments Maclaren is doing well in rehab. He recently began using the treadmill  and did well at a speed of 1.1 mph with no incline. He also improved to level 2 on the recumbent bike and continues to work at level 2 on the T4 nustep. We will continue to monitor his progress in the program. noris is doing well in the program. he was able to increase his level on the recumbent bike from level 2 to level 3.6. We were also able to add the T6 nustep to his exercise prescription at level 4. We will continue to monitor his progress in the program. Reviewed home exercise with pt today.  Pt plans to walk outside around his property, as well as doing some general carpentry work for exercise.  Reviewed THR, pulse, RPE, sign and symptoms, pulse oximetery and when to call 911 or MD.  Also discussed weather considerations and indoor options.  Pt voiced understanding. Merrit continues to do well in the program. He recently improved to level 2 on the biostep, level 3 on the T4 nustep, and level 4 on the recumbent bike. He also continues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 snack ideas ready in case he is not hungry but his energy level or BS is low. Onie is still smoking with no current plan to reduce cigs at this time. He does not check his blood sugar regularly, but states he feels like it has been stable with his current medication. His blood pressure has been stable during the program and he mentions looking into a way to check it at home    Expected Outcomes Short: check blood sugar consistently. Long: set goal for quit date for smoking STG: avoid missing meals, use snacks if needed. LTG: control blood sugars independently STG: avoid missing meals, prepare simple snacks. LTG: control blood sugars independently Short: explore home blood pressure cuff options, check his blood sugar more consistently. Long: independently manage risk factors.             Core Components/Risk Factors/Patient Goals at Discharge (Final Review):   Goals and Risk Factor Review - 08/12/23 1313       Core Components/Risk Factors/Patient Goals Review   Personal Goals Review Tobacco Cessation;Hypertension;Diabetes    Review Labryan is still smoking with no current plan to reduce cigs at this time. He does not check his blood sugar regularly, but states he feels like it has been stable with his current medication. His blood pressure has been stable during the program and he mentions looking into a way to check it at home    Expected Outcomes Short: explore home blood pressure cuff options, check his blood sugar more consistently. Long: independently manage risk factors.             ITP Comments:  ITP Comments     Row Name 04/29/23 1208 05/06/23 1630 05/18/23 1124 06/02/23 0831 06/30/23 0839   ITP Comments Virtual orientation call completed today. he has an appointment on Date: 05/06/2023  for EP eval and gym Orientation.  Documentation of diagnosis [facility] be found in [facility] Date: 03/23/2023 .   Gaines is a current tobacco user.  Intervention for tobacco cessation was provided at the initial medical review. He was asked about readiness to quit and reported he his working  on cessation with meds and his physicians help. . Patient was advised and educated about tobacco cessation using combination therapy, tobacco cessation classes, quit line, and quit smoking apps. Patient demonstrated understanding of this material. Staff will continue to provide encouragement and follow up with the patient throughout the program. Completed and gym orientation. Initial ITP created and sent for review to Dr. Firman Hughes, Medical Director. Deavonte is a current tobacco user. Intervention for tobacco cessation was provided at the initial medical review. He was asked about readiness to quit and reported that he is ready and is on medication to assist with it. Patient was advised and educated about tobacco cessation using combination therapy, tobacco cessation classes, quit line, and quit smoking apps. Patient demonstrated understanding of this material. Staff will continue to provide encouragement and follow up with the patient throughout the program. First full day of exercise!  Patient was oriented to gym and equipment including functions, settings, policies, and procedures.  Patient's individual exercise prescription and treatment plan were reviewed.  All starting workloads were established based on the results of the 6 minute walk test done at initial orientation visit.  The plan for exercise progression was also introduced and progression will be customized based on patient's performance and goals. 30 Day review completed. Medical Director ITP review  done, changes made as directed, and signed approval by Medical Director. New Patient 30 Day review completed. Medical Director ITP review done, changes made as directed, and signed approval by Medical Director.    Row Name 07/28/23 1610 08/25/23 1207         ITP Comments 30 Day review completed. Medical  Director ITP review done, changes made as directed, and signed approval by Medical Director. 30 Day review completed. Medical Director ITP review done, changes made as directed, and signed approval by Medical Director.               Comments: 30 day review

## 2023-08-25 NOTE — Progress Notes (Signed)
 30 Day review completed. Medical Director ITP review done, changes made as directed, and signed approval by Medical Director. ? ?

## 2023-08-26 ENCOUNTER — Encounter: Payer: No Typology Code available for payment source | Admitting: *Deleted

## 2023-08-26 DIAGNOSIS — Z48812 Encounter for surgical aftercare following surgery on the circulatory system: Secondary | ICD-10-CM | POA: Diagnosis not present

## 2023-08-26 DIAGNOSIS — R0602 Shortness of breath: Secondary | ICD-10-CM

## 2023-08-26 DIAGNOSIS — Z952 Presence of prosthetic heart valve: Secondary | ICD-10-CM

## 2023-08-26 NOTE — Progress Notes (Signed)
 Daily Session Note  Patient Details  Name: Jason Beck MRN: [id number] Date of Birth: Aug 25, 1955 Referring Provider:   Flowsheet Row Cardiac Rehab from 05/06/2023 in [facility] Cardiac and Pulmonary Rehab  Referring Provider Canda Cera, FNP       Encounter Date: 08/26/2023  Check In:  Session Check In - 08/26/23 1223       Check-In   Supervising physician immediately available to respond to emergencies See telemetry face sheet for immediately available ER MD    Location [facility]-Cardiac & Pulmonary Rehab    Staff Present Maud Sorenson, RN, BSN, CCRP;Meredith Manson Seitz RN,BSN;Joseph Hood RCP,RRT,BSRT;Maxon [place] BS, Exercise Physiologist;Jason Martina Sledge RDN,LDN    Virtual Visit No    Medication changes reported     No    Fall or balance concerns reported    No    Warm-up and Cool-down Performed on first and last piece of equipment    Resistance Training Performed Yes    VAD Patient? No    PAD/SET Patient? No      Pain Assessment   Currently in Pain? No/denies                Social History   Tobacco Use  Smoking Status Every Day   Current packs/day: 0.50   Types: Cigarettes  Smokeless Tobacco Never  Tobacco Comments   Ready to quit: working on quit  taking meds   Counseling given: yes    Goals Met:  Independence with exercise equipment Exercise tolerated well No report of concerns or symptoms today  Goals Unmet:  Not Applicable  Comments: Pt able to follow exercise prescription today without complaint.  Will continue to monitor for progression.    Dr. Firman Hughes is Medical Director for [facility] Cardiac Rehabilitation.  Dr. Fuad Aleskerov is Medical Director for [facility] Pulmonary Rehabilitation.

## 2023-08-31 ENCOUNTER — Encounter: Payer: No Typology Code available for payment source | Admitting: *Deleted

## 2023-08-31 DIAGNOSIS — R0602 Shortness of breath: Secondary | ICD-10-CM

## 2023-08-31 DIAGNOSIS — Z952 Presence of prosthetic heart valve: Secondary | ICD-10-CM

## 2023-08-31 DIAGNOSIS — Z48812 Encounter for surgical aftercare following surgery on the circulatory system: Secondary | ICD-10-CM | POA: Diagnosis not present

## 2023-08-31 NOTE — Progress Notes (Signed)
 Daily Session Note  Patient Details  Name: LEARY BABAYEV MRN: [id number] Date of Birth: [birth year] Referring Provider:   Flowsheet Row Cardiac Rehab from 05/06/2023 in [facility] Cardiac and Pulmonary Rehab  Referring Provider Canda Cera, FNP       Encounter Date: 08/31/2023  Check In:  Session Check In - 08/31/23 1137       Check-In   Supervising physician immediately available to respond to emergencies See telemetry face sheet for immediately available ER MD    Location [facility]-Cardiac & Pulmonary Rehab    Staff Present Maud Sorenson, RN, BSN, CCRP;Meredith Manson Seitz RN,BSN;Margaret Best, MS, Exercise Physiologist;Maxon [place] BS, Exercise Physiologist;Jason Martina Sledge RDN,LDN    Virtual Visit No    Medication changes reported     No    Fall or balance concerns reported    No    Warm-up and Cool-down Performed on first and last piece of equipment    Resistance Training Performed Yes    VAD Patient? No    PAD/SET Patient? No      Pain Assessment   Currently in Pain? No/denies                Social History   Tobacco Use  Smoking Status Every Day   Current packs/day: 0.50   Types: Cigarettes  Smokeless Tobacco Never  Tobacco Comments   Ready to quit: working on quit  taking meds   Counseling given: yes    Goals Met:  Independence with exercise equipment Exercise tolerated well No report of concerns or symptoms today  Goals Unmet:  Not Applicable  Comments: Pt able to follow exercise prescription today without complaint.  Will continue to monitor for progression.    Dr. Firman Hughes is Medical Director for [facility] Cardiac Rehabilitation.  Dr. Fuad Aleskerov is Medical Director for [facility] Pulmonary Rehabilitation.

## 2023-09-02 ENCOUNTER — Encounter: Payer: No Typology Code available for payment source | Admitting: *Deleted

## 2023-09-02 DIAGNOSIS — Z952 Presence of prosthetic heart valve: Secondary | ICD-10-CM

## 2023-09-02 DIAGNOSIS — Z48812 Encounter for surgical aftercare following surgery on the circulatory system: Secondary | ICD-10-CM | POA: Diagnosis not present

## 2023-09-02 DIAGNOSIS — R0602 Shortness of breath: Secondary | ICD-10-CM

## 2023-09-02 NOTE — Progress Notes (Signed)
 Daily Session Note  Patient Details  Name: ROSHARD HUDELSON MRN: [id number] Date of Birth: 1956-04-13 Referring Provider:   Flowsheet Row Cardiac Rehab from 05/06/2023 in [facility] Cardiac and Pulmonary Rehab  Referring Provider Canda Cera, FNP       Encounter Date: 09/02/2023  Check In:  Session Check In - 09/02/23 1112       Check-In   Supervising physician immediately available to respond to emergencies See telemetry face sheet for immediately available ER MD    Location [facility]-Cardiac & Pulmonary Rehab    Staff Present Maud Sorenson, RN, BSN, CCRP;Joseph Hood RCP,RRT,BSRT;Margaret Best, MS, Exercise Physiologist;Maxon Conetta BS, Exercise Physiologist    Virtual Visit No    Medication changes reported     No    Fall or balance concerns reported    No    Warm-up and Cool-down Performed on first and last piece of equipment    Resistance Training Performed Yes    VAD Patient? No    PAD/SET Patient? No      Pain Assessment   Currently in Pain? No/denies                Social History   Tobacco Use  Smoking Status Every Day   Current packs/day: 0.50   Types: Cigarettes  Smokeless Tobacco Never  Tobacco Comments   Ready to quit: working on quit  taking meds   Counseling given: yes    Goals Met:  Independence with exercise equipment Exercise tolerated well No report of concerns or symptoms today  Goals Unmet:  Not Applicable  Comments: Pt able to follow exercise prescription today without complaint.  Will continue to monitor for progression.    Dr. Firman Hughes is Medical Director for [facility] Cardiac Rehabilitation.  Dr. Fuad Aleskerov is Medical Director for [facility] Pulmonary Rehabilitation.

## 2023-09-07 ENCOUNTER — Encounter: Admitting: *Deleted

## 2023-09-07 DIAGNOSIS — Z952 Presence of prosthetic heart valve: Secondary | ICD-10-CM

## 2023-09-07 DIAGNOSIS — Z48812 Encounter for surgical aftercare following surgery on the circulatory system: Secondary | ICD-10-CM | POA: Diagnosis not present

## 2023-09-07 DIAGNOSIS — R0602 Shortness of breath: Secondary | ICD-10-CM

## 2023-09-07 NOTE — Progress Notes (Signed)
 Daily Session Note  Patient Details  Name: Jason Beck MRN: [id number] Date of Birth: October 04, 1955 Referring Provider:   Flowsheet Row Cardiac Rehab from 05/06/2023 in [facility] Cardiac and Pulmonary Rehab  Referring Provider Jason Cera, FNP       Encounter Date: 09/07/2023  Check In:  Session Check In - 09/07/23 1128       Check-In   Supervising physician immediately available to respond to emergencies See telemetry face sheet for immediately available ER MD    Location [facility]-Cardiac & Pulmonary Rehab    Staff Present Maud Sorenson, RN, BSN, CCRP;Meredith Manson Seitz RN,BSN;Maxon [employer], Exercise Physiologist;Noah Tickle, BS, Exercise Physiologist    Virtual Visit No    Medication changes reported     No    Fall or balance concerns reported    No    Warm-up and Cool-down Performed on first and last piece of equipment    Resistance Training Performed Yes    VAD Patient? No    PAD/SET Patient? No      Pain Assessment   Currently in Pain? No/denies                Social History   Tobacco Use  Smoking Status Every Day   Current packs/day: 0.50   Types: Cigarettes  Smokeless Tobacco Never  Tobacco Comments   Ready to quit: working on quit  taking meds   Counseling given: yes    Goals Met:  Independence with exercise equipment Exercise tolerated well No report of concerns or symptoms today  Goals Unmet:  Not Applicable  Comments: Pt able to follow exercise prescription today without complaint.  Will continue to monitor for progression.   Jason Beck graduated today from  rehab with 36 sessions completed.  Details of the patient's exercise prescription and what He needs to do in order to continue the prescription and progress were discussed with patient.  Patient was given a copy of prescription and goals.  Patient verbalized understanding. Jason Beck plans to continue to exercise by walking outside..   Dr. Firman Hughes is Medical Director for [facility] Cardiac  Rehabilitation.  Dr. Fuad Aleskerov is Medical Director for [facility] Pulmonary Rehabilitation.

## 2023-09-07 NOTE — Progress Notes (Signed)
 Cardiac Individual Treatment Plan  Patient Details  Name: Jason Beck MRN: [id number] Date of Birth: [birth year] Referring Provider:   Flowsheet Row Cardiac Rehab from 05/06/2023 in [facility] Cardiac and Pulmonary Rehab  Referring Provider Canda Cera, FNP       Initial Encounter Date:  Flowsheet Row Cardiac Rehab from 05/06/2023 in [facility] Cardiac and Pulmonary Rehab  Date 05/06/23       Visit Diagnosis: S/P TAVR (transcatheter aortic valve replacement)  Shortness of breath  Patient's Home Medications on Admission:  Current Outpatient Medications:    albuterol  (PROVENTIL ) (2.5 MG/3ML) 0.083% nebulizer solution, , Disp: , Rfl:    albuterol  (VENTOLIN  HFA) 108 (90 Base) MCG/ACT inhaler, INHALE 2 PUFFS BY ORAL INHALATION EVERY 4 TO 6 HOURS AS NEEDED FOR CHRONIC OBSTRUCTIVE LUNG DISEASE FOR BREATHING. BE SURE TO WASH MOUTHPIECE WITH WARM WATER ONCE A WEEK, Disp: , Rfl:    amiodarone  (PACERONE ) 200 MG tablet, Take 1 tablet (200 mg total) by mouth daily., Disp: 90 tablet, Rfl: 1   apixaban  (ELIQUIS ) 5 MG TABS tablet, Take 1 tablet (5 mg total) by mouth 2 (two) times daily., Disp: 60 tablet, Rfl: 15   atorvastatin  (LIPITOR ) 80 MG tablet, Take 1 tablet (80 mg total) by mouth daily., Disp: 90 tablet, Rfl: 3   buprenorphine (SUBUTEX) 2 MG SUBL SL tablet, Place 2 mg under the tongue every 12 (twelve) hours., Disp: , Rfl:    buPROPion  (WELLBUTRIN  SR) 150 MG 12 hr tablet, Take 150 mg by mouth daily., Disp: , Rfl:    ciclesonide  (ALVESCO ) 160 MCG/ACT inhaler, Inhale 2 puffs into the lungs 2 (two) times daily., Disp: , Rfl:    clopidogrel  (PLAVIX ) 75 MG tablet, Take 1 tablet (75 mg total) by mouth daily. (Patient not taking: Reported on 04/29/2023), Disp: 30 tablet, Rfl: 1   docusate sodium (COLACE) 50 MG capsule, Take 50 mg by mouth daily as needed for mild constipation., Disp: , Rfl:    doxycycline  (ADOXA) 100 MG tablet, Take 100 mg by mouth 2 (two) times daily. (Patient not taking: Reported on  04/29/2023), Disp: , Rfl:    empagliflozin  (JARDIANCE ) 10 MG TABS tablet, Take 10 mg by mouth daily., Disp: , Rfl:    ezetimibe  (ZETIA ) 10 MG tablet, Take 1 tablet by mouth daily., Disp: , Rfl:    ferrous sulfate 325 (65 FE) MG tablet, Take 325 mg by mouth every other day., Disp: , Rfl:    furosemide  (LASIX ) 40 MG tablet, Take 1 tablet (40 mg total) by mouth daily., Disp: 30 tablet, Rfl: 11   gabapentin  (NEURONTIN ) 400 MG capsule, Take 800 mg by mouth 3 (three) times daily., Disp: , Rfl:    losartan  (COZAAR ) 25 MG tablet, Take 1 tablet (25 mg total) by mouth daily. (Patient taking differently: Take 12.5 mg by mouth daily.), Disp: 30 tablet, Rfl: 11   metFORMIN  (GLUCOPHAGE ) 500 MG tablet, Take 500 mg by mouth 2 (two) times daily with a meal., Disp: , Rfl:    methadone  (DOLOPHINE ) 10 MG tablet, Take 5 tablets by mouth 2 (two) times daily., Disp: , Rfl:    metoprolol  succinate (TOPROL  XL) 50 MG 24 hr tablet, Take 1 tablet (50 mg total) by mouth daily. Take with or immediately following a meal., Disp: 30 tablet, Rfl: 11   midodrine  (PROAMATINE ) 10 MG tablet, Take 1 tablet (10 mg total) by mouth 3 (three) times daily with meals. (Patient not taking: Reported on 04/29/2023), Disp: 90 tablet, Rfl: 1   naloxone (NARCAN) nasal  spray 4 mg/0.1 mL, Place 0.4 mg into the nose once., Disp: , Rfl:    nitroGLYCERIN  (NITROSTAT ) 0.4 MG SL tablet, Place 1 tablet (0.4 mg total) under the tongue every 5 (five) minutes x 3 doses as needed for chest pain., Disp: 30 tablet, Rfl: 0   omeprazole (PRILOSEC) 20 MG capsule, Take 20 mg by mouth daily., Disp: , Rfl:    oxyCODONE  (OXY IR/ROXICODONE ) 5 MG immediate release tablet, Take 10 mg by mouth every 4 (four) hours as needed for severe pain. (Patient not taking: Reported on 04/29/2023), Disp: , Rfl:    oxycodone  (OXY-IR) 5 MG capsule, Take 5 mg by mouth every 4 (four) hours as needed., Disp: , Rfl:    pantoprazole  (PROTONIX ) 40 MG tablet, TAKE 1 TABLET BY MOUTH EVERY DAY (Patient  not taking: Reported on 04/29/2023), Disp: 30 tablet, Rfl: 1   potassium chloride  SA (KLOR-CON  M) 20 MEQ tablet, Take 1 tablet (20 mEq total) by mouth daily., Disp: 30 tablet, Rfl: 1   ranolazine  (RANEXA ) 500 MG 12 hr tablet, Take 1 tablet (500 mg total) by mouth 2 (two) times daily. (Patient not taking: Reported on 04/29/2023), Disp: 60 tablet, Rfl: 11   thiamine  (VITAMIN B-1) 100 MG tablet, Take 1 tablet (100 mg total) by mouth daily., Disp: 30 tablet, Rfl: 3   ticagrelor (BRILINTA) 90 MG TABS tablet, Take 1 tablet by mouth 2 (two) times daily., Disp: , Rfl:    tiotropium (SPIRIVA ) 18 MCG inhalation capsule, Place 1 capsule (18 mcg total) into inhaler and inhale daily., Disp: 30 capsule, Rfl: 1   Tiotropium Bromide -Olodaterol 2.5-2.5 MCG/ACT AERS, , Disp: , Rfl:    traZODone  (DESYREL ) 50 MG tablet, Take 25 mg by mouth at bedtime., Disp: , Rfl:   Past Medical History: Past Medical History:  Diagnosis Date   Asthma    Atrial fibrillation (HCC)    Bicuspid aortic valve    CHF (congestive heart failure) (HCC)    COPD (chronic obstructive pulmonary disease) (HCC)    Depression    Diabetes mellitus without complication (HCC)    Hypertension     Tobacco Use: Social History   Tobacco Use  Smoking Status Every Day   Current packs/day: 0.50   Types: Cigarettes  Smokeless Tobacco Never  Tobacco Comments   Ready to quit: working on quit  taking meds   Counseling given: yes    Labs: Review Flowsheet  More data may exist      Latest Ref Rng & Units 03/20/2022 03/21/2022 04/19/2022 05/02/2022 06/16/2022  Labs for ITP Cardiac and Pulmonary Rehab  Cholestrol 0 - 200 mg/dL 161  096  - - -  LDL (calc) 0 - 99 mg/dL 94  96  - - -  HDL-C >04 mg/dL 36  39  - - -  Trlycerides <150 mg/dL 540  82  - - -  Hemoglobin A1c 4.8 - 5.6 % - 5.8  6.4  - -  Bicarbonate 20.0 - 28.0 mmol/L - - - 19.9  26.4   Acid-base deficit 0.0 - 2.0 mmol/L - - - 3.3  -  O2 Saturation % - - - 78.8  95.1      Exercise  Target Goals: Exercise Program Goal: Individual exercise prescription set using results from initial 6 min walk test and THRR while considering  patient's activity barriers and safety.   Exercise Prescription Goal: Initial exercise prescription builds to 30-45 minutes a day of aerobic activity, 2-3 days per week.  Home exercise guidelines  will be given to patient during program as part of exercise prescription that the participant will acknowledge.   Education: Aerobic Exercise: - Group verbal and visual presentation on the components of exercise prescription. Introduces F.I.T.T principle from ACSM for exercise prescriptions.  Reviews F.I.T.T. principles of aerobic exercise including progression. Written material given at graduation.   Education: Resistance Exercise: - Group verbal and visual presentation on the components of exercise prescription. Introduces F.I.T.T principle from ACSM for exercise prescriptions  Reviews F.I.T.T. principles of resistance exercise including progression. Written material given at graduation.    Education: Exercise & Equipment Safety: - Individual verbal instruction and demonstration of equipment use and safety with use of the equipment. Flowsheet Row Cardiac Rehab from 08/05/2023 in [facility] Cardiac and Pulmonary Rehab  Date 05/06/23  Educator MB  Instruction Review Code 1- Verbalizes Understanding       Education: Exercise Physiology & General Exercise Guidelines: - Group verbal and written instruction with models to review the exercise physiology of the cardiovascular system and associated critical values. Provides general exercise guidelines with specific guidelines to those with heart or lung disease.    Education: Flexibility, Balance, Mind/Body Relaxation: - Group verbal and visual presentation with interactive activity on the components of exercise prescription. Introduces F.I.T.T principle from ACSM for exercise prescriptions. Reviews F.I.T.T.  principles of flexibility and balance exercise training including progression. Also discusses the mind body connection.  Reviews various relaxation techniques to help reduce and manage stress (i.e. Deep breathing, progressive muscle relaxation, and visualization). Balance handout provided to take home. Written material given at graduation.   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 in the R foot causing lots of pain and some balance issues    Cardiac Risk Stratification High             6 Minute Walk:  6 Minute Walk     Row Name 05/06/23 1632 08/19/23 1121       6 Minute Walk   Phase Initial Discharge    Distance 560 feet 655 feet    Distance % Change -- 17 %    Distance Feet Change -- 95 ft    Walk Time 4.87 minutes 5.29 minutes    # of Rest Breaks 3 2    MPH 1.3 1.4    METS 1.91 1.96    RPE 15 17    Perceived Dyspnea  0 2    VO2 Peak 6.67 6.87    Symptoms Yes (comment) Yes (comment)    Comments L hip and knee pain 8/10, R foot pain 8/10 Right calf cramping    Resting HR 53 bpm 66 bpm    Resting BP 130/80 98/54    Resting Oxygen Saturation  97 % 93 %    Exercise Oxygen Saturation  during 6 min walk 95 % 92 %    Max Ex. HR 82 bpm 77 bpm    Max Ex. BP 140/72 148/62    2 Minute Post BP 134/72 --             Oxygen Initial Assessment:   Oxygen Re-Evaluation:   Oxygen Discharge (Final Oxygen Re-Evaluation):   Initial Exercise Prescription:  Initial Exercise Prescription - 05/06/23 1600  Date of Initial Exercise RX and Referring Provider   Date 05/06/23    Referring Provider Canda Cera, FNP      Oxygen   Maintain Oxygen Saturation 88% or higher       Recumbant Bike   Level 1    RPM 50    Watts 15    Minutes 15    METs 1.91      NuStep   Level 1    SPM 80    Minutes 15    METs 1.91      Biostep-RELP   Level 1    SPM 50    Minutes 15    METs 1.91      Track   Laps 17    Minutes 15    METs 1.92      Prescription Details   Frequency (times per week) 2    Duration Progress to 30 minutes of continuous aerobic without signs/symptoms of physical distress      Intensity   THRR 40-80% of Max Heartrate 92-132    Ratings of Perceived Exertion 11-13    Perceived Dyspnea 0-4      Progression   Progression Continue to progress workloads to maintain intensity without signs/symptoms of physical distress.      Resistance Training   Training Prescription Yes    Weight 7 lb    Reps 10-15             Perform Capillary Blood Glucose checks as needed.  Exercise Prescription Changes:   Exercise Prescription Changes     Row Name 05/06/23 1600 06/01/23 1300 06/14/23 1700 06/29/23 1400 07/13/23 0700     Response to Exercise   Blood Pressure (Admit) 130/80 136/72 122/62 126/68 122/60   Blood Pressure (Exercise) 140/72 176/80 -- 144/76 --   Blood Pressure (Exit) 134/72 118/62 118/62 110/60 118/62   Heart Rate (Admit) 53 bpm 79 bpm 64 bpm 62 bpm 77 bpm   Heart Rate (Exercise) 82 bpm 95 bpm 86 bpm 95 bpm 100 bpm   Heart Rate (Exit) 44 bpm 67 bpm 52 bpm 56 bpm 61 bpm   Oxygen Saturation (Admit) 97 % -- -- -- --   Oxygen Saturation (Exercise) 95 % -- -- -- --   Oxygen Saturation (Exit) 98 % -- -- -- --   Rating of Perceived Exertion (Exercise) 15 15 15 16 15    Perceived Dyspnea (Exercise) 0 -- -- -- --   Symptoms L leg and hip 8/10, R foot 8/10 none back pain on track none none   Comments results First two weeks of exercise -- -- --   Duration Progress to 30 minutes of  aerobic without signs/symptoms of physical distress Progress to 30 minutes of  aerobic without signs/symptoms of physical distress Continue with 30 min of  aerobic exercise without signs/symptoms of physical distress. Continue with 30 min of aerobic exercise without signs/symptoms of physical distress. Continue with 30 min of aerobic exercise without signs/symptoms of physical distress.   Intensity THRR New THRR unchanged THRR unchanged THRR unchanged THRR unchanged     Progression   Progression Continue to progress workloads to maintain intensity without signs/symptoms of physical distress. Continue to progress workloads to maintain intensity without signs/symptoms of physical distress. Continue to progress workloads to maintain intensity without signs/symptoms of physical distress. Continue to progress workloads to maintain intensity without signs/symptoms of physical distress. Continue to progress workloads to maintain intensity without signs/symptoms of physical distress.   Average METs 1.91  2.01 2.26 2.01 2.14     Resistance Training   Training Prescription -- Yes Yes Yes Yes   Weight -- 7 lb 7 lb 7 lb 7 lb   Reps -- 10-15 10-15 10-15 10-15     Interval Training   Interval Training -- No No No No     Treadmill   MPH -- -- -- -- 1.1   Grade -- -- -- -- 0   Minutes -- -- -- -- 15   METs -- -- -- -- 1.84     Recumbant Bike   Level -- 1.5 1.2 -- 2   Watts -- 15 15 -- 15   Minutes -- 15 15 -- 15   METs -- 2.66 2.63 -- 2.63     NuStep   Level -- 1 2 2 2    Minutes -- 15 15 15 15    METs -- 2.1 2.4 2.7 2.1     Biostep-RELP   Level -- 1 1 3 1    Minutes -- 30 15 15 15    METs -- 2 3 2  2.1     Track   Laps -- 4 5 5  --   Minutes -- 15 15 15  --   METs -- 1.22 1.27 1.27 --     Oxygen   Maintain Oxygen Saturation -- 88% or higher 88% or higher 88% or higher 88% or higher    Row Name 07/28/23 1400 08/10/23 1100 08/12/23 1500 08/24/23 1400       Response to Exercise   Blood Pressure (Admit) 120/58 -- 122/56 98/54    Blood Pressure (Exercise) -- -- -- 148/62    Blood Pressure (Exit) 108/60 -- 124/60 126/60    Heart Rate (Admit) 63  bpm -- 71 bpm 66 bpm    Heart Rate (Exercise) 97 bpm -- 91 bpm 88 bpm    Heart Rate (Exit) 53 bpm -- 64 bpm 68 bpm    Oxygen Saturation (Admit) -- -- -- 94 %    Oxygen Saturation (Exercise) -- -- -- 95 %    Oxygen Saturation (Exit) -- -- -- 94 %    Rating of Perceived Exertion (Exercise) 15 -- 15 17    Perceived Dyspnea (Exercise) -- -- -- 2    Symptoms none -- none none    Duration Continue with 30 min of aerobic exercise without signs/symptoms of physical distress. -- Continue with 30 min of aerobic exercise without signs/symptoms of physical distress. Continue with 30 min of aerobic exercise without signs/symptoms of physical distress.    Intensity THRR unchanged -- THRR unchanged THRR unchanged      Progression   Progression Continue to progress workloads to maintain intensity without signs/symptoms of physical distress. -- Continue to progress workloads to maintain intensity without signs/symptoms of physical distress. Continue to progress workloads to maintain intensity without signs/symptoms of physical distress.    Average METs 2.08 -- 2.16 2.05      Resistance Training   Training Prescription Yes -- Yes Yes    Weight 7 lb -- 7 lb 7 lb    Reps 10-15 -- 10-15 10-15      Interval Training   Interval Training No -- No No      Treadmill   MPH -- -- -- 0.6    Grade -- -- -- 0    Minutes -- -- -- 15    METs -- -- -- 1.46      Recumbant Bike   Level 3.6 -- 4 4.3  Watts 15 -- 15 15    Minutes 15 -- 15 15    METs 3 -- 2.59 2.6      NuStep   Level 1 -- 3 4    Minutes 15 -- 15 15    METs 2 -- 2.3 2.8      T5 Nustep   Level 4  T6 -- 2 --    Minutes 15 -- 15 --    METs 2.1 -- 1.9 --      Biostep-RELP   Level 1 -- 2 2    Minutes 15 -- 15 15    METs 2 -- 2 2      Track   Laps -- -- -- 5    Minutes -- -- -- 15    METs -- -- -- 1.27      Home Exercise Plan   Plans to continue exercise at -- Home (comment)  [name] plans to walk outside around his property. 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 Home (comment)  Javarious plans to walk outside around his property. 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 Home (comment)  Dodger plans to walk outside around his property. 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    Frequency -- Add 3 additional days to program exercise sessions. Add 3 additional days to program exercise sessions. Add 3 additional days to program exercise sessions.    Initial Home Exercises Provided -- 08/10/23 08/10/23 08/10/23      Oxygen   Maintain Oxygen Saturation 88% or higher 88% or higher 88% or higher 88% or higher             Exercise Comments:   Exercise Comments     Row Name 05/18/23 1124 09/07/23 1129         Exercise Comments First full day of exercise!  Patient was oriented to gym and equipment including functions, settings, policies, and procedures.  Patient's individual exercise prescription and treatment plan were reviewed.  All starting workloads were established based on the results of the 6 minute walk test done at initial orientation visit.  The plan for exercise progression was also introduced and progression will be customized based on patient's performance and goals. Lillie graduated today from  rehab with 36 sessions completed.  Details of the patient's exercise prescription and what He needs to do in order to continue the prescription and progress were discussed with patient.  Patient was given a copy of prescription and goals.  Patient verbalized understanding. Lorence plans to continue to exercise by walking outside..               Exercise Goals and Review:   Exercise Goals     Row Name 05/06/23 1638             Exercise Goals   Increase Physical Activity Yes       Intervention Develop an individualized exercise prescription for aerobic and resistive training based on initial evaluation findings, risk  stratification, comorbidities and participant's personal goals.;Provide advice, education, support and counseling about physical activity/exercise needs.       Expected Outcomes Short Term: Attend rehab on a regular basis to increase amount of physical activity.;Long Term: Add in home exercise to make exercise part of routine and to increase amount of physical activity.;Long Term: Exercising regularly at least 3-5 days a week.  Increase Strength and Stamina Yes       Intervention Provide advice, education, support and counseling about physical activity/exercise needs.;Develop an individualized exercise prescription for aerobic and resistive training based on initial evaluation findings, risk stratification, comorbidities and participant's personal goals.       Expected Outcomes Short Term: Increase workloads from initial exercise prescription for resistance, speed, and METs.;Short Term: Perform resistance training exercises routinely during rehab and add in resistance training at home;Long Term: Improve cardiorespiratory fitness, muscular endurance and strength as measured by increased METs and functional capacity ( )       Able to understand and use rate of perceived exertion (RPE) scale Yes       Intervention Provide education and explanation on how to use RPE scale       Expected Outcomes Short Term: Able to use RPE daily in rehab to express subjective intensity level;Long Term:  Able to use RPE to guide intensity level when exercising independently       Able to understand and use Dyspnea scale Yes       Intervention Provide education and explanation on how to use Dyspnea scale       Expected Outcomes Short Term: Able to use Dyspnea scale daily in rehab to express subjective sense of shortness of breath during exertion;Long Term: Able to use Dyspnea scale to guide intensity level when exercising independently       Knowledge and understanding of Target Heart Rate Range (THRR) Yes        Intervention Provide education and explanation of THRR including how the numbers were predicted and where they are located for reference       Expected Outcomes Short Term: Able to state/look up THRR;Short Term: Able to use daily as guideline for intensity in rehab;Long Term: Able to use THRR to govern intensity when exercising independently       Able to check pulse independently Yes       Intervention Provide education and demonstration on how to check pulse in carotid and radial arteries.;Review the importance of being able to check your own pulse for safety during independent exercise       Expected Outcomes Short Term: Able to explain why pulse checking is important during independent exercise;Long Term: Able to check pulse independently and accurately       Understanding of Exercise Prescription Yes       Intervention Provide education, explanation, and written materials on patient's individual exercise prescription       Expected Outcomes Short Term: Able to explain program exercise prescription;Long Term: Able to explain home exercise prescription to exercise independently                Exercise Goals Re-Evaluation :  Exercise Goals Re-Evaluation     Row Name 05/18/23 1124 06/01/23 1402 06/14/23 1712 06/29/23 1448 07/08/23 1124     Exercise Goal Re-Evaluation   Exercise Goals Review Understanding of Exercise Prescription;Knowledge and understanding of Target Heart Rate Range (THRR);Able to understand and use rate of perceived exertion (RPE) scale;Able to understand and use Dyspnea scale Increase Physical Activity;Increase Strength and Stamina;Understanding of Exercise Prescription Increase Physical Activity;Increase Strength and Stamina;Understanding of Exercise Prescription Increase Physical Activity;Increase Strength and Stamina;Understanding of Exercise Prescription Increase Physical Activity;Increase Strength and Stamina;Understanding of Exercise Prescription   Comments Reviewed  RPE and dyspnea scale, THR and program prescription with pt today.  Pt voiced understanding and was given a copy of goals to take home. Klark is off to a  good start in the program. He has done well at level 1 on the T4 nustep and biostep, and level 1.5 on the recumbent bike. He also attempted to walk the track and was able to reach 4 laps. We will continue to monitor his progress in the program. Ames is doing well in rehab. He went up to level 2 on the T4 nustep and was able to do 5 laps on the track prior to back pain. We will continue to monitor his progress in the program. Travus continues to do well in rehab. He has continued to walk the track and has walked 5 laps on the track prior to back pain. He also improved to level 3 on the biostep and continues to work at level 2 on the T4 nustep. We will continue to monitor his progress in the program. Mathan continues to come to Rehab and has been doing well. He reports frequent leg cramps. He is on lasix  and often low on electrolytes. Encouraged him to try mustard packets too see if that would help.   Expected Outcomes Short: Use RPE daily to regulate intensity. Long: Follow program prescription in THR. Short: Continue to follow current exercise prescription. Long: Continue exercise to improve strength and stamina. Short: Continue to push for more laps on the track as back pain tolerates. Long: Continue exercise to improve strength and stamina. Short: Continue to push for more laps on the track as tolerated by back pain. Long: Continue exercise to improve strength and stamina. STG: continue to attend cardiac rehab. LTG: Continue exercise to improve strength and stamina    Row Name 07/13/23 0802 07/28/23 1417 08/10/23 1141 08/12/23 1544 08/24/23 1440     Exercise Goal Re-Evaluation   Exercise Goals Review Increase Physical Activity;Increase Strength and Stamina;Understanding of Exercise Prescription Increase Physical Activity;Increase Strength and  Stamina;Understanding of Exercise Prescription Increase Physical Activity;Increase Strength and Stamina;Understanding of Exercise Prescription Increase Physical Activity;Increase Strength and Stamina;Understanding of Exercise Prescription Increase Physical Activity;Increase Strength and Stamina;Understanding of Exercise Prescription   Comments Syre is doing well in rehab. He recently began using the treadmill and did well at a speed of 1.1 mph with no incline. He also improved to level 2 on the recumbent bike and continues to work at level 2 on the T4 nustep. We will continue to monitor his progress in the program. alfred is doing well in the program. he was able to increase his level on the recumbent bike from level 2 to level 3.6. We were also able to add the T6 nustep to his exercise prescription at level 4. We will continue to monitor his progress in the program. Reviewed home exercise with pt today.  Pt plans to walk outside around his property, as well as doing some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 06/29/23  1149 07/08/23 1132 08/12/23 1313       Core Components/Risk Factors/Patient Goals Review   Personal Goals Review Diabetes;Tobacco Cessation Diabetes Diabetes Tobacco Cessation;Hypertension;Diabetes    Review Leona has been enjoying the program so far. He has been trying to cut back on his smoking with lifestyle changes and medication, but he states his stress level is high and smoking is the one thing that helps calm him down. He wants to keep trying and plans to explore other relaxing activities. He also mentions that his sugar has been steady in the 130s when checks it sporadically. He mentions using peanut butter as his way to get his sugar up when he does feel it drop, education was provided about the 15-15 rule, checking his sugar if available, and using 15 grams of fast acting and checking his sugar in 15 minutes. He says he keeps orange juice in the fridge and will try that if it happens again. Spoke with [name] about not missing meals, even though he isnt hungry, educated on the importance of consistent carb intake. Provided some small snack ideas that will help him better control his blood sugars. Rosco is still working on changes discussed around not missing meals. Encouraged him to have some simple grab and go snack ideas ready in case he is not hungry but his energy level or BS is low. Roger is still smoking with no current plan to reduce cigs at this time. He does not check his blood sugar regularly, but states he feels like it has been stable with his current medication. His blood pressure has been stable during the program and he mentions looking into a way to check it at home    Expected Outcomes Short: check blood sugar consistently. Long: set goal for quit date for smoking STG: avoid missing meals, use snacks if needed. LTG: control blood sugars independently STG: avoid missing meals, prepare  simple snacks. LTG: control blood sugars independently Short: explore home blood pressure cuff options, check his blood sugar more consistently. Long: independently manage risk factors.             Core Components/Risk Factors/Patient Goals at Discharge (Final Review):   Goals and Risk Factor Review - 08/12/23 1313       Core Components/Risk Factors/Patient Goals Review   Personal Goals Review Tobacco Cessation;Hypertension;Diabetes    Review Zyquan is still smoking with no current plan to reduce cigs at this time. He does not check his blood sugar regularly, but states he feels like it has been stable with his current medication. His blood pressure has been stable during the program and he mentions looking into a way to check it at home    Expected Outcomes Short: explore home blood pressure cuff options, check his blood sugar more consistently. Long: independently manage risk factors.             ITP Comments:  ITP Comments     Row Name 04/29/23 1208 05/06/23 1630 05/18/23 1124 06/02/23 0831 06/30/23 0839   ITP Comments Virtual orientation call completed today. he has an appointment on Date: 05/06/2023  for EP eval and gym Orientation.  Documentation of diagnosis can be found in [facility] Date: 03/23/2023 .   Anfernee is a current tobacco user. Intervention for tobacco cessation was provided at the initial medical review. He was asked about readiness to quit and reported he his working  on cessation with meds and his physicians help. . Patient was advised and educated about tobacco cessation  using combination therapy, tobacco cessation classes, quit line, and quit smoking apps. Patient demonstrated understanding of this material. Staff will continue to provide encouragement and follow up with the patient throughout the program. Completed and gym orientation. Initial ITP created and sent for review to Dr. Firman Hughes, Medical Director. Cypher is a current tobacco user. Intervention for tobacco  cessation was provided at the initial medical review. He was asked about readiness to quit and reported that he is ready and is on medication to assist with it. Patient was advised and educated about tobacco cessation using combination therapy, tobacco cessation classes, quit line, and quit smoking apps. Patient demonstrated understanding of this material. Staff will continue to provide encouragement and follow up with the patient throughout the program. First full day of exercise!  Patient was oriented to gym and equipment including functions, settings, policies, and procedures.  Patient's individual exercise prescription and treatment plan were reviewed.  All starting workloads were established based on the results of the 6 minute walk test done at initial orientation visit.  The plan for exercise progression was also introduced and progression will be customized based on patient's performance and goals. 30 Day review completed. Medical Director ITP review done, changes made as directed, and signed approval by Medical Director. New Patient 30 Day review completed. Medical Director ITP review done, changes made as directed, and signed approval by Medical Director.    Row Name 07/28/23 4098 08/25/23 1207 09/07/23 1130       ITP Comments 30 Day review completed. Medical Director ITP review done, changes made as directed, and signed approval by Medical Director. 30 Day review completed. Medical Director ITP review done, changes made as directed, and signed approval by Medical Director. Darl graduated today from  rehab with 36 sessions completed.  Details of the patient's exercise prescription and what He needs to do in order to continue the prescription and progress were discussed with patient.  Patient was given a copy of prescription and goals.  Patient verbalized understanding. Maleke plans to continue to exercise by walking outside..              Comments: Discharge ITP

## 2023-09-07 NOTE — Progress Notes (Signed)
 Discharge Note for  Jason Beck     1955/11/12         [name] graduated today from rehab with 36 sessions completed. Details of the patient's exercise prescription and what He needs to do in order to continue the prescription and progress were discussed with patient. Patient was given a copy of prescription and goals. Patient verbalized understanding. Talor plans to continue to exercise by walking outside..    6 Minute Walk     Row Name 05/06/23 1632 08/19/23 1121       6 Minute Walk   Phase Initial Discharge    Distance 560 feet 655 feet    Distance % Change -- 17 %    Distance Feet Change -- 95 ft    Walk Time 4.87 minutes 5.29 minutes    # of Rest Breaks 3 2    MPH 1.3 1.4    METS 1.91 1.96    RPE 15 17    Perceived Dyspnea  0 2    VO2 Peak 6.67 6.87    Symptoms Yes (comment) Yes (comment)    Comments L hip and knee pain 8/10, R foot pain 8/10 Right calf cramping    Resting HR 53 bpm 66 bpm    Resting BP 130/80 98/54    Resting Oxygen Saturation  97 % 93 %    Exercise Oxygen Saturation  during 6 min walk 95 % 92 %    Max Ex. HR 82 bpm 77 bpm    Max Ex. BP 140/72 148/62    2 Minute Post BP 134/72 --

## 2023-09-28 ENCOUNTER — Emergency Department
Admission: EM | Admit: 2023-09-28 | Discharge: 2023-09-29 | Disposition: A | Discharge status: Home / Self Care | Attending: Emergency Medicine | Admitting: Emergency Medicine

## 2023-09-28 ENCOUNTER — Emergency Department

## 2023-09-28 ENCOUNTER — Other Ambulatory Visit: Payer: Self-pay

## 2023-09-28 DIAGNOSIS — R7989 Other specified abnormal findings of blood chemistry: Secondary | ICD-10-CM | POA: Insufficient documentation

## 2023-09-28 DIAGNOSIS — Z720 Tobacco use: Secondary | ICD-10-CM | POA: Diagnosis not present

## 2023-09-28 DIAGNOSIS — Z7901 Long term (current) use of anticoagulants: Secondary | ICD-10-CM | POA: Insufficient documentation

## 2023-09-28 DIAGNOSIS — J441 Chronic obstructive pulmonary disease with (acute) exacerbation: Secondary | ICD-10-CM | POA: Insufficient documentation

## 2023-09-28 DIAGNOSIS — R059 Cough, unspecified: Secondary | ICD-10-CM | POA: Diagnosis present

## 2023-09-28 DIAGNOSIS — I2510 Atherosclerotic heart disease of native coronary artery without angina pectoris: Secondary | ICD-10-CM | POA: Diagnosis not present

## 2023-09-28 LAB — CBC WITH DIFFERENTIAL/PLATELET
Abs Immature Granulocytes: 0.06 10*3/uL (ref 0.00–0.07)
Basophils Absolute: 0.1 10*3/uL (ref 0.0–0.1)
Basophils Relative: 1 %
Eosinophils Absolute: 0.1 10*3/uL (ref 0.0–0.5)
Eosinophils Relative: 1 %
HCT: 49.2 % (ref 39.0–52.0)
Hemoglobin: 16.7 g/dL (ref 13.0–17.0)
Immature Granulocytes: 1 %
Lymphocytes Relative: 18 %
Lymphs Abs: 2.1 10*3/uL (ref 0.7–4.0)
MCH: 30.8 pg (ref 26.0–34.0)
MCHC: 33.9 g/dL (ref 30.0–36.0)
MCV: 90.8 fL (ref 80.0–100.0)
Monocytes Absolute: 0.8 10*3/uL (ref 0.1–1.0)
Monocytes Relative: 7 %
Neutro Abs: 8.5 10*3/uL — ABNORMAL HIGH (ref 1.7–7.7)
Neutrophils Relative %: 72 %
Platelets: 272 10*3/uL (ref 150–400)
RBC: 5.42 MIL/uL (ref 4.22–5.81)
RDW: 14 % (ref 11.5–15.5)
WBC: 11.5 10*3/uL — ABNORMAL HIGH (ref 4.0–10.5)
nRBC: 0 % (ref 0.0–0.2)

## 2023-09-28 LAB — RESP PANEL BY RT-PCR (RSV, FLU A&B, COVID)  RVPGX2
Influenza A by PCR: NEGATIVE
Influenza B by PCR: NEGATIVE
Resp Syncytial Virus by PCR: NEGATIVE
SARS Coronavirus 2 by RT PCR: NEGATIVE

## 2023-09-28 LAB — TROPONIN I (HIGH SENSITIVITY)
Troponin I (High Sensitivity): 18 ng/L — ABNORMAL HIGH (ref <18)
Troponin I (High Sensitivity): 19 ng/L — ABNORMAL HIGH (ref <18)

## 2023-09-28 LAB — BASIC METABOLIC PANEL WITH GFR
Anion gap: 14 (ref 5–15)
BUN: 28 mg/dL — ABNORMAL HIGH (ref 8–23)
CO2: 21 mmol/L — ABNORMAL LOW (ref 22–32)
Calcium: 10.3 mg/dL (ref 8.9–10.3)
Chloride: 104 mmol/L (ref 98–111)
Creatinine, Ser: 0.98 mg/dL (ref 0.61–1.24)
GFR, Estimated: >60 mL/min (ref >60)
Glucose, Bld: 149 mg/dL — ABNORMAL HIGH (ref 70–99)
Potassium: 3.5 mmol/L (ref 3.5–5.1)
Sodium: 139 mmol/L (ref 135–145)

## 2023-09-28 LAB — BRAIN NATRIURETIC PEPTIDE: B Natriuretic Peptide: 200.6 pg/mL — ABNORMAL HIGH (ref 0.0–100.0)

## 2023-09-28 NOTE — ED Triage Notes (Signed)
 EMS brings pt in from [facility] for c/o CP and [facility]

## 2023-09-28 NOTE — ED Provider Triage Note (Signed)
 Emergency Medicine Provider Triage Evaluation Note  Jason Beck , a 68 y.o. male  was evaluated in triage.  Pt complains of SOB that began two days ago, also having cough and congestion. Also having chest pain.  Review of Systems  Positive: See above Negative:   Physical Exam  SpO2 100%  Gen:   Awake, no distress   Resp:  Normal effort  MSK:   Moves extremities without difficulty  Other:    Medical Decision Making  Medically screening exam initiated at 7:39 PM.  Appropriate orders placed.  Dietrich Fragmin was informed that the remainder of the evaluation will be completed by another provider, this initial triage assessment does not replace that evaluation, and the importance of remaining in the ED until their evaluation is complete.     Phyliss Breen, PA-C 09/28/23 1941

## 2023-09-28 NOTE — ED Triage Notes (Signed)
 Pt to ED via EMS from [facility], pt reports he has been short of breath for the past few days associated with cough and congestion. Pt states he has had some chest pain also, pt had MI within the past year.

## 2023-09-29 MED ORDER — METHYLPREDNISOLONE SODIUM SUCC 125 MG IJ SOLR
125.0000 mg | Freq: Once | INTRAMUSCULAR | Status: AC
  Administered 2023-09-29: 125 mg via INTRAVENOUS
  Filled 2023-09-29: qty 2

## 2023-09-29 MED ORDER — PREDNISONE 50 MG PO TABS: 50.0000 mg | ORAL_TABLET | Freq: Every day | ORAL | 0 refills | Status: AC

## 2023-09-29 MED ORDER — PREDNISONE 20 MG PO TABS: 60.0000 mg | ORAL_TABLET | Freq: Once | ORAL | Status: DC

## 2023-09-29 MED ORDER — IPRATROPIUM-ALBUTEROL 0.5-2.5 (3) MG/3ML IN SOLN
6.0000 mL | Freq: Once | RESPIRATORY_TRACT | Status: AC
  Administered 2023-09-29: 6 mL via RESPIRATORY_TRACT
  Filled 2023-09-29: qty 3

## 2023-09-29 NOTE — ED Provider Notes (Signed)
 [name]    Event Date/Time   First MD Initiated Contact with Patient 09/28/23 2350     (approximate)   History   Shortness of Breath   HPI  Jason Beck is a 68 y.o. male who presents to the ED for evaluation of Shortness of Breath   Review a [facility] cardiology clinic visit from December.  History of CAD, A-fib on Eliquis , COPD, nonischemic cardiomyopathy, tobacco abuse, PAD, TAVR 2024  Patient presents for evaluation of a few days of nonproductive cough, dyspnea on exertion, wheezing.  No chest pain, dizziness or syncope.  No fevers   Physical Exam   Triage Vital Signs: ED Triage Vitals  Encounter Vitals Group     BP 09/28/23 1942 (!) 147/82     Systolic BP Percentile --      Diastolic BP Percentile --      Pulse Rate 09/28/23 1942 77     Resp 09/28/23 1942 20     Temp 09/28/23 1942 97.8 F (36.6 C)     Temp Source 09/28/23 2246 Oral     SpO2 09/28/23 1932 100 %     Weight 09/28/23 1941 195 lb (88.5 kg)     Height 09/28/23 1941 5' 8 (1.727 m)     Head Circumference --      Peak Flow --      Pain Score 09/28/23 1941 7     Pain Loc --      Pain Education --      Exclude from Growth Chart --     Most recent vital signs: Vitals:   09/28/23 2246 09/29/23 0254  BP: (!) 156/87 128/76  Pulse: 67 67  Resp: 18 17  Temp: 98.1 F (36.7 C) 98.1 F (36.7 C)  SpO2: 97% 95%    General: Awake, no distress.  CV:  Good peripheral perfusion.  Resp:  Normal effort.  No distress.  Wheezing throughout and slightly decreased airflow Abd:  No distention.  MSK:  No deformity noted.  Neuro:  No focal deficits appreciated. Other:     ED Results / Procedures / Treatments   Labs (all labs ordered are listed, but only abnormal results are displayed) Labs Reviewed  BASIC METABOLIC PANEL WITH GFR - Abnormal; Notable for the following components:      Result Value   CO2 21 (*)    Glucose, Bld 149 (*)    BUN 28 (*)    All other  components within normal limits  BRAIN NATRIURETIC PEPTIDE - Abnormal; Notable for the following components:   B Natriuretic Peptide 200.6 (*)    All other components within normal limits  CBC WITH DIFFERENTIAL/PLATELET - Abnormal; Notable for the following components:   WBC 11.5 (*)    Neutro Abs 8.5 (*)    All other components within normal limits  TROPONIN I (HIGH SENSITIVITY) - Abnormal; Notable for the following components:   Troponin I (High Sensitivity) 18 (*)    All other components within normal limits  TROPONIN I (HIGH SENSITIVITY) - Abnormal; Notable for the following components:   Troponin I (High Sensitivity) 19 (*)    All other components within normal limits  RESP PANEL BY RT-PCR (RSV, FLU A&B, COVID)  [id number]    EKG Sinus rhythm with a rate of 78 bpm.  Normal axis . without clear signs of acute ischemia.  QTc 528  RADIOLOGY CXR interpreted by me without evidence of acute cardiopulmonary pathology.  Official radiology  report(s): DG Chest 2 View Result Date: 09/28/2023 CLINICAL DATA:  Short of breath chest pain EXAM: CHEST - 2 VIEW COMPARISON:  07/12/2022, CT 04/26/2022 FINDINGS: No acute airspace disease or effusion. Valve prosthesis. Stable cardiomediastinal silhouette. Vague left suprahilar opacity in the region of CT demonstrated lung nodule. IMPRESSION: No active cardiopulmonary disease. Vague left suprahilar opacity in the region of CT demonstrated lung nodule, reference 07/12/2022 report. Follow-up chest CT may be performed to better reassess the CT lesion Electronically Signed   By: Esmeralda Hedge M.D.   On: 09/28/2023 20:07    PROCEDURES and INTERVENTIONS:  .1-3 Lead EKG Interpretation  Performed by: Arline Bennett, MD Authorized by: Arline Bennett, MD     Interpretation: normal     ECG rate:  70   ECG rate assessment: normal     Rhythm: sinus rhythm     Ectopy: none     Conduction: normal     Medications  ipratropium-albuterol  (DUONEB) 0.5-2.5 (3) MG/3ML  nebulizer solution 6 mL (6 mLs Nebulization Given 09/29/23 0105)  methylPREDNISolone  sodium succinate (SOLU-MEDROL ) 125 mg/2 mL injection 125 mg (125 mg Intravenous Given 09/29/23 0104)     IMPRESSION / MDM / ASSESSMENT AND PLAN / ED COURSE  I reviewed the triage vital signs and the nursing notes.  Differential diagnosis includes, but is not limited to, ACS, PTX, PNA, muscle strain/spasm, PE, dissection, anxiety, pleural effusion, COPD exacerbation  {Patient presents with symptoms of an acute illness or injury that is potentially life-threatening.  Patient presents to the ED with evidence of a COPD exacerbation.  Wheezing on exam.  CXR is clear.  Mildly elevated BNP is present.  Troponins are minimally elevated and flat.  No indication for antibiotics.  No increased sputum production.  No significant metabolic derangements.  Will provide breathing treatments, steroids and reassess.  Improved dramatically with breathing treatment and starting course of steroids.  I considered admission for this patient but believe he be suitable for outpatient management.  Clinical Course as of 09/29/23 0307  Wed Sep 29, 2023  0303 Reassessed. Feeling better, no longer wheezing [DS]    Clinical Course User Index [DS] Arline Bennett, MD     FINAL CLINICAL IMPRESSION(S) / ED DIAGNOSES   Final diagnoses:  COPD exacerbation (HCC)     Rx / DC Orders   ED Discharge Orders          Ordered    predniSONE  (DELTASONE ) 50 MG tablet  Daily        09/29/23 0132             Note:  This document was prepared using Dragon voice recognition software and may include unintentional dictation errors.   Arline Bennett, MD 09/29/23 [id number]

## 2024-02-01 ENCOUNTER — Other Ambulatory Visit: Payer: Self-pay

## 2024-02-01 DIAGNOSIS — I739 Peripheral vascular disease, unspecified: Secondary | ICD-10-CM

## 2024-02-03 ENCOUNTER — Other Ambulatory Visit: Payer: Self-pay

## 2024-02-03 DIAGNOSIS — I739 Peripheral vascular disease, unspecified: Secondary | ICD-10-CM

## 2024-02-04 ENCOUNTER — Ambulatory Visit: Admission: RE | Admit: 2024-02-04 | Discharge: 2024-02-04 | Disposition: A | Source: Ambulatory Visit

## 2024-02-04 DIAGNOSIS — I739 Peripheral vascular disease, unspecified: Secondary | ICD-10-CM | POA: Insufficient documentation
